# Patient Record
Sex: Female | Born: 1970
Health system: Southern US, Community
[De-identification: ages and names within clinical notes are randomized; demographics above are authoritative.]

## PROBLEM LIST (undated history)

## (undated) DIAGNOSIS — K219 Gastro-esophageal reflux disease without esophagitis: Secondary | ICD-10-CM

## (undated) DIAGNOSIS — G43909 Migraine, unspecified, not intractable, without status migrainosus: Secondary | ICD-10-CM

## (undated) DIAGNOSIS — H6980 Other specified disorders of Eustachian tube, unspecified ear: Secondary | ICD-10-CM

## (undated) DIAGNOSIS — E538 Deficiency of other specified B group vitamins: Secondary | ICD-10-CM

## (undated) DIAGNOSIS — G5600 Carpal tunnel syndrome, unspecified upper limb: Secondary | ICD-10-CM

## (undated) DIAGNOSIS — T7840XA Allergy, unspecified, initial encounter: Secondary | ICD-10-CM

## (undated) DIAGNOSIS — H6990 Unspecified Eustachian tube disorder, unspecified ear: Secondary | ICD-10-CM

## (undated) HISTORY — DX: Deficiency of other specified B group vitamins: E53.8

## (undated) HISTORY — DX: Carpal tunnel syndrome, unspecified upper limb: G56.00

## (undated) HISTORY — DX: Migraine, unspecified, not intractable, without status migrainosus: G43.909

## (undated) HISTORY — DX: Allergy, unspecified, initial encounter: T78.40XA

## (undated) HISTORY — PX: BUNIONECTOMY: SHX129

## (undated) HISTORY — DX: Unspecified eustachian tube disorder, unspecified ear: H69.90

## (undated) HISTORY — DX: Other specified disorders of Eustachian tube, unspecified ear: H69.80

## (undated) HISTORY — PX: TONSILLECTOMY: SUR1361

## (undated) HISTORY — DX: Gastro-esophageal reflux disease without esophagitis: K21.9

---

## 1998-07-08 ENCOUNTER — Other Ambulatory Visit: Admission: RE | Admit: 1998-07-08 | Discharge: 1998-07-08 | Payer: Self-pay | Admitting: Obstetrics and Gynecology

## 1998-11-28 ENCOUNTER — Emergency Department (HOSPITAL_COMMUNITY): Admission: EM | Admit: 1998-11-28 | Discharge: 1998-11-29 | Payer: Self-pay | Admitting: Emergency Medicine

## 1998-12-07 ENCOUNTER — Other Ambulatory Visit: Admission: RE | Admit: 1998-12-07 | Discharge: 1998-12-07 | Payer: Self-pay | Admitting: Obstetrics and Gynecology

## 1999-05-13 ENCOUNTER — Emergency Department (HOSPITAL_COMMUNITY): Admission: EM | Admit: 1999-05-13 | Discharge: 1999-05-13 | Payer: Self-pay | Admitting: Emergency Medicine

## 1999-12-13 ENCOUNTER — Other Ambulatory Visit: Admission: RE | Admit: 1999-12-13 | Discharge: 1999-12-13 | Payer: Self-pay | Admitting: Obstetrics and Gynecology

## 2000-12-14 ENCOUNTER — Other Ambulatory Visit: Admission: RE | Admit: 2000-12-14 | Discharge: 2000-12-14 | Payer: Self-pay | Admitting: Obstetrics and Gynecology

## 2003-01-08 ENCOUNTER — Encounter: Admission: RE | Admit: 2003-01-08 | Discharge: 2003-01-08 | Payer: Self-pay | Admitting: Internal Medicine

## 2004-08-24 ENCOUNTER — Ambulatory Visit: Payer: Self-pay | Admitting: Internal Medicine

## 2005-01-12 ENCOUNTER — Ambulatory Visit: Payer: Self-pay | Admitting: Internal Medicine

## 2006-02-28 ENCOUNTER — Ambulatory Visit: Payer: Self-pay | Admitting: Internal Medicine

## 2006-02-28 LAB — CONVERTED CEMR LAB
ALT: 12 units/L (ref 0–40)
AST: 16 units/L (ref 0–37)
Albumin: 3.1 g/dL — ABNORMAL LOW (ref 3.5–5.2)
Alkaline Phosphatase: 66 units/L (ref 39–117)
BUN: 12 mg/dL (ref 6–23)
Basophils Absolute: 0 10*3/uL (ref 0.0–0.1)
Basophils Relative: 0.1 % (ref 0.0–1.0)
Bilirubin Urine: NEGATIVE
Bilirubin, Direct: 0.2 mg/dL (ref 0.0–0.3)
CO2: 27 meq/L (ref 19–32)
Calcium: 8.9 mg/dL (ref 8.4–10.5)
Chloride: 106 meq/L (ref 96–112)
Cholesterol: 173 mg/dL (ref 0–200)
Creatinine, Ser: 0.8 mg/dL (ref 0.4–1.2)
Eosinophils Absolute: 0.1 10*3/uL (ref 0.0–0.6)
Eosinophils Relative: 1 % (ref 0.0–5.0)
GFR calc Af Amer: 104 mL/min
GFR calc non Af Amer: 86 mL/min
Glucose, Bld: 104 mg/dL — ABNORMAL HIGH (ref 70–99)
HCT: 36 % (ref 36.0–46.0)
HDL: 68.2 mg/dL (ref >39.0)
Hemoglobin, Urine: NEGATIVE
Hemoglobin: 12.8 g/dL (ref 12.0–15.0)
Ketones, ur: NEGATIVE mg/dL
LDL Cholesterol: 90 mg/dL (ref 0–99)
Leukocytes, UA: NEGATIVE
Lymphocytes Relative: 23.6 % (ref 12.0–46.0)
MCHC: 35.5 g/dL (ref 30.0–36.0)
MCV: 90.2 fL (ref 78.0–100.0)
Monocytes Absolute: 0.4 10*3/uL (ref 0.2–0.7)
Monocytes Relative: 6 % (ref 3.0–11.0)
Neutro Abs: 4.5 10*3/uL (ref 1.4–7.7)
Neutrophils Relative %: 69.3 % (ref 43.0–77.0)
Nitrite: NEGATIVE
Platelets: 370 10*3/uL (ref 150–400)
Potassium: 4.2 meq/L (ref 3.5–5.1)
RBC: 3.99 M/uL (ref 3.87–5.11)
RDW: 12.1 % (ref 11.5–14.6)
Sodium: 136 meq/L (ref 135–145)
Specific Gravity, Urine: 1.015 (ref 1.000–1.03)
TSH: 0.58 microintl units/mL (ref 0.35–5.50)
Total Bilirubin: 0.7 mg/dL (ref 0.3–1.2)
Total CHOL/HDL Ratio: 2.5
Total Protein, Urine: NEGATIVE mg/dL
Total Protein: 6.9 g/dL (ref 6.0–8.3)
Triglycerides: 75 mg/dL (ref 0–149)
Urine Glucose: NEGATIVE mg/dL
Urobilinogen, UA: 1 (ref 0.0–1.0)
VLDL: 15 mg/dL (ref 0–40)
WBC: 6.5 10*3/uL (ref 4.5–10.5)
pH: 6.5 (ref 5.0–8.0)

## 2006-03-06 ENCOUNTER — Ambulatory Visit: Payer: Self-pay | Admitting: Internal Medicine

## 2006-03-10 ENCOUNTER — Ambulatory Visit: Payer: Self-pay | Admitting: Internal Medicine

## 2006-12-25 ENCOUNTER — Ambulatory Visit: Payer: Self-pay | Admitting: Internal Medicine

## 2006-12-25 DIAGNOSIS — J029 Acute pharyngitis, unspecified: Secondary | ICD-10-CM | POA: Insufficient documentation

## 2006-12-25 DIAGNOSIS — H6980 Other specified disorders of Eustachian tube, unspecified ear: Secondary | ICD-10-CM | POA: Insufficient documentation

## 2007-01-31 ENCOUNTER — Telehealth: Payer: Self-pay | Admitting: Internal Medicine

## 2007-01-31 ENCOUNTER — Encounter: Payer: Self-pay | Admitting: Internal Medicine

## 2007-01-31 DIAGNOSIS — J309 Allergic rhinitis, unspecified: Secondary | ICD-10-CM | POA: Insufficient documentation

## 2007-02-01 ENCOUNTER — Ambulatory Visit: Payer: Self-pay | Admitting: Internal Medicine

## 2007-02-01 DIAGNOSIS — R002 Palpitations: Secondary | ICD-10-CM | POA: Insufficient documentation

## 2007-05-14 ENCOUNTER — Ambulatory Visit: Payer: Self-pay | Admitting: Internal Medicine

## 2007-05-14 DIAGNOSIS — M654 Radial styloid tenosynovitis [de Quervain]: Secondary | ICD-10-CM | POA: Insufficient documentation

## 2007-05-14 DIAGNOSIS — G5600 Carpal tunnel syndrome, unspecified upper limb: Secondary | ICD-10-CM | POA: Insufficient documentation

## 2007-06-01 ENCOUNTER — Telehealth: Payer: Self-pay | Admitting: Internal Medicine

## 2007-06-05 ENCOUNTER — Ambulatory Visit: Payer: Self-pay | Admitting: Internal Medicine

## 2007-09-10 ENCOUNTER — Ambulatory Visit: Payer: Self-pay | Admitting: Internal Medicine

## 2007-09-10 DIAGNOSIS — J0100 Acute maxillary sinusitis, unspecified: Secondary | ICD-10-CM

## 2007-09-10 DIAGNOSIS — J039 Acute tonsillitis, unspecified: Secondary | ICD-10-CM | POA: Insufficient documentation

## 2007-09-10 DIAGNOSIS — J019 Acute sinusitis, unspecified: Secondary | ICD-10-CM | POA: Insufficient documentation

## 2007-09-13 ENCOUNTER — Telehealth: Payer: Self-pay | Admitting: Internal Medicine

## 2008-03-19 ENCOUNTER — Ambulatory Visit: Payer: Self-pay | Admitting: Endocrinology

## 2008-03-19 DIAGNOSIS — J069 Acute upper respiratory infection, unspecified: Secondary | ICD-10-CM | POA: Insufficient documentation

## 2008-03-20 ENCOUNTER — Telehealth: Payer: Self-pay | Admitting: Internal Medicine

## 2008-03-28 ENCOUNTER — Telehealth: Payer: Self-pay | Admitting: Internal Medicine

## 2008-09-04 ENCOUNTER — Ambulatory Visit: Payer: Self-pay | Admitting: Internal Medicine

## 2008-09-04 DIAGNOSIS — R5381 Other malaise: Secondary | ICD-10-CM | POA: Insufficient documentation

## 2008-09-04 DIAGNOSIS — R5383 Other fatigue: Secondary | ICD-10-CM

## 2008-09-04 LAB — CONVERTED CEMR LAB
BUN: 10 mg/dL (ref 6–23)
Basophils Absolute: 0 10*3/uL (ref 0.0–0.1)
Basophils Relative: 0 % (ref 0.0–3.0)
CO2: 32 meq/L (ref 19–32)
Calcium: 9.4 mg/dL (ref 8.4–10.5)
Chloride: 101 meq/L (ref 96–112)
Creatinine, Ser: 0.8 mg/dL (ref 0.4–1.2)
Eosinophils Absolute: 0.1 10*3/uL (ref 0.0–0.7)
Eosinophils Relative: 0.9 % (ref 0.0–5.0)
Folate: 13.1 ng/mL
Free T4: 0.7 ng/dL (ref 0.6–1.6)
GFR calc non Af Amer: 102.71 mL/min (ref >60)
Glucose, Bld: 102 mg/dL — ABNORMAL HIGH (ref 70–99)
HCT: 39.8 % (ref 36.0–46.0)
Hemoglobin: 13.4 g/dL (ref 12.0–15.0)
Lymphocytes Relative: 23.5 % (ref 12.0–46.0)
Lymphs Abs: 1.6 10*3/uL (ref 0.7–4.0)
MCHC: 33.6 g/dL (ref 30.0–36.0)
MCV: 92.5 fL (ref 78.0–100.0)
Monocytes Absolute: 0.2 10*3/uL (ref 0.1–1.0)
Monocytes Relative: 3.4 % (ref 3.0–12.0)
Neutro Abs: 4.9 10*3/uL (ref 1.4–7.7)
Neutrophils Relative %: 72.2 % (ref 43.0–77.0)
Platelets: 353 10*3/uL (ref 150.0–400.0)
Potassium: 4.5 meq/L (ref 3.5–5.1)
RBC: 4.3 M/uL (ref 3.87–5.11)
RDW: 12.2 % (ref 11.5–14.6)
Sodium: 139 meq/L (ref 135–145)
TSH: 0.94 microintl units/mL (ref 0.35–5.50)
Vitamin B-12: 147 pg/mL — ABNORMAL LOW (ref 211–911)
WBC: 6.8 10*3/uL (ref 4.5–10.5)

## 2008-09-05 ENCOUNTER — Telehealth: Payer: Self-pay | Admitting: Internal Medicine

## 2008-09-08 ENCOUNTER — Ambulatory Visit: Payer: Self-pay | Admitting: Internal Medicine

## 2008-10-09 ENCOUNTER — Ambulatory Visit: Payer: Self-pay | Admitting: Internal Medicine

## 2008-11-07 ENCOUNTER — Ambulatory Visit: Payer: Self-pay | Admitting: Internal Medicine

## 2008-12-08 ENCOUNTER — Ambulatory Visit: Payer: Self-pay | Admitting: Internal Medicine

## 2008-12-08 DIAGNOSIS — E538 Deficiency of other specified B group vitamins: Secondary | ICD-10-CM | POA: Insufficient documentation

## 2009-01-01 ENCOUNTER — Ambulatory Visit: Payer: Self-pay | Admitting: Internal Medicine

## 2009-01-03 ENCOUNTER — Telehealth (INDEPENDENT_AMBULATORY_CARE_PROVIDER_SITE_OTHER): Payer: Self-pay | Admitting: *Deleted

## 2009-01-05 ENCOUNTER — Telehealth: Payer: Self-pay | Admitting: Internal Medicine

## 2009-02-10 ENCOUNTER — Telehealth (INDEPENDENT_AMBULATORY_CARE_PROVIDER_SITE_OTHER): Payer: Self-pay | Admitting: *Deleted

## 2009-02-10 ENCOUNTER — Ambulatory Visit: Payer: Self-pay | Admitting: Internal Medicine

## 2009-02-19 ENCOUNTER — Ambulatory Visit: Payer: Self-pay | Admitting: Internal Medicine

## 2009-02-19 LAB — CONVERTED CEMR LAB: Vitamin B-12: 242 pg/mL (ref 211–911)

## 2009-02-23 ENCOUNTER — Encounter: Payer: Self-pay | Admitting: Internal Medicine

## 2009-06-11 ENCOUNTER — Encounter: Payer: Self-pay | Admitting: Internal Medicine

## 2009-06-14 ENCOUNTER — Encounter: Admission: RE | Admit: 2009-06-14 | Discharge: 2009-06-14 | Payer: Self-pay | Admitting: Orthopedic Surgery

## 2009-09-30 ENCOUNTER — Ambulatory Visit: Payer: Self-pay | Admitting: Internal Medicine

## 2009-09-30 DIAGNOSIS — N390 Urinary tract infection, site not specified: Secondary | ICD-10-CM | POA: Insufficient documentation

## 2009-09-30 LAB — CONVERTED CEMR LAB
Bilirubin Urine: NEGATIVE
Glucose, Urine, Semiquant: NEGATIVE
Ketones, urine, test strip: NEGATIVE
Nitrite: NEGATIVE
Protein, U semiquant: NEGATIVE
Specific Gravity, Urine: 1.02
Urobilinogen, UA: 0.2
WBC Urine, dipstick: NEGATIVE
pH: 5

## 2009-10-07 ENCOUNTER — Telehealth: Payer: Self-pay | Admitting: Internal Medicine

## 2010-02-23 NOTE — Assessment & Plan Note (Signed)
Summary: b12 inj--men---stc  Nurse Visit   Allergies: 1)  ! Tamiflu (Oseltamivir Phosphate)  Medication Administration  Injection # 1:    Medication: Vit B12 1000 mcg    Diagnosis: B12 DEFICIENCY (ICD-266.2)    Route: IM    Site: L deltoid    Exp Date: 11/25/2010    Lot #: [id number]    Mfr: American Regent    Patient tolerated injection without complications    Given by: Lamar Sprinkles, CMA (February 10, 2009 3:14 PM)  Orders Added: 1)  Vit B12 1000 mcg [J3420] 2)  Admin of Therapeutic Inj  intramuscular or subcutaneous [[id number]]   Medication Administration  Injection # 1:    Medication: Vit B12 1000 mcg    Diagnosis: B12 DEFICIENCY (ICD-266.2)    Route: IM    Site: L deltoid    Exp Date: 11/25/2010    Lot #: [id number]    Mfr: American Regent    Patient tolerated injection without complications    Given by: Lamar Sprinkles, CMA (February 10, 2009 3:14 PM)  Orders Added: 1)  Vit B12 1000 mcg [J3420] 2)  Admin of Therapeutic Inj  intramuscular or subcutaneous [[id number]]

## 2010-02-23 NOTE — Letter (Signed)
   [facility] [address] [place], [place]  [address] Phone: [phone]      February 23, 2009   [facility] [address] [place], [place] [address]  RE:  LAB RESULTS  Dear  Ms. Martian,  The following is an interpretation of your most recent lab tests.  Please take note of any instructions provided or changes to medications that have resulted from your lab work.    B12 - 242 ( normal is 211-980)   Just at normal levels. We can hold B12 replacement for sixl months and then recheck.  Call or e-mail me if you have questions ([email or web address]).   Sincerely Yours,    Jacques Navy MD

## 2010-02-23 NOTE — Progress Notes (Signed)
Summary: REQ FOR RX  Phone Note Call from Patient Call back at [phone]   Summary of Call: Patient is requesting rx for vaginal yeast infection. Also, pt continues to c/o ear pain. Please advise.  Initial call taken by: Lamar Sprinkles, CMA,  October 07, 2009 2:38 PM  Follow-up for Phone Call        1. OK for fluconazole stat 150mg  1 pill x 1 2. if ear continues to be a problem - follow-up ov Follow-up by: Jacques Navy MD,  October 08, 2009 12:56 PM  Additional Follow-up for Phone Call Additional follow up Details #1::        pt states diflucan does not work for her. She states her gyno has given her a cream before that works better but unsure of name  Additional Follow-up by: Ami Bullins CMA,  October 08, 2009 2:42 PM    Additional Follow-up for Phone Call Additional follow up Details #2::    most of the antifungal vaginal creams are otc, e.g miconazole, etc. If she can recall the Rx brand from gyn that would be great or perhaps she could get gyn to call it in.  Follow-up by: Jacques Navy MD,  October 08, 2009 3:38 PM  Additional Follow-up for Phone Call Additional follow up Details #3:: Details for Additional Follow-up Action Taken: left mess to call office back...................Marland KitchenLamar Sprinkles, CMA  October 08, 2009 4:49 PM  Advised pt to call gyn to get a prescription. Additional Follow-up by: Daphane Shepherd,  October 09, 2009 3:38 PM  lm for pt to call back/ Ami Bullins CMA  October 09, 2009 2:23 PM

## 2010-02-23 NOTE — Progress Notes (Signed)
Summary: B12  Phone Note Call from Patient   Summary of Call: Pt started monthly b12 injections 08/2008. It has been 6 mths, should she continue? schedule f/u office visit? or labs?  Initial call taken by: Lamar Sprinkles, CMA,  February 10, 2009 3:10 PM  Follow-up for Phone Call        lab: B12 level 266.2 with recommendations to follow Follow-up by: Jacques Navy MD,  February 10, 2009 6:19 PM  Additional Follow-up for Phone Call Additional follow up Details #1::        Left vm for pt to come to lab, please add to idx, [facility] Additional Follow-up by: Lamar Sprinkles, CMA,  February 10, 2009 6:28 PM    Additional Follow-up for Phone Call Additional follow up Details #2::    Lab in [facility]. Follow-up by: Verdell Face,  February 11, 2009 8:15 AM

## 2010-02-23 NOTE — Letter (Signed)
Summary: [facility]  [facility]   Imported By: Lester [place] 06/25/2009 10:40:18  _____________________________________________________________________  External Attachment:    Type:   Image     Comment:   External Document

## 2010-02-23 NOTE — Assessment & Plan Note (Signed)
Summary: ?bladder or ?uti/cd   Vital Signs:  Patient profile:   40 year old female Height:      65 inches Weight:      182 pounds BMI:     30.40 O2 Sat:      97 % on Room air Temp:     97.5 degrees F oral Pulse rate:   84 / minute BP sitting:   110 / 72  (left arm) Cuff size:   regular  Vitals Entered By: Bill Salinas CMA (September 30, 2009 9:52 AM)  O2 Flow:  Room air CC: pt here for poss UTI/ ab   Primary Care Provider:  Norins  CC:  pt here for poss UTI/ ab.  History of Present Illness: Patient presents with a 2 week h/o earache right that has not responded to sudafed. The discomfort does keep her up.  She reports that she has had bladder symptoms for 2-3 weeks: she has pain when standing or walking. She has had frequency, dysuria. No post-micturition bladder spasm. Has had urgency. Has had hot spells and chills. No flank pain.  Current Medications (verified): 1)  Multivitamins   Tabs (Multiple Vitamin) .... Once Daily 2)  Claritin-D 12 Hour 5-120 Mg  Tb12 (Loratadine-Pseudoephedrine) .... Take As Needed 3)  Advil 200 Mg  Tabs (Ibuprofen) .... As Needed 4)  Aleve 220 Mg  Tabs (Naproxen Sodium) .... As Needed 5)  Ortho Tri-Cyclen Lo 0.025 Mg Tabs (Norgestimate-Ethinyl Estradiol) .... Take 1 By Mouth Qd 6)  Tessalon Perles 100 Mg Caps (Benzonatate) .... Three Times A Day As Needed Cough 7)  Terazol 3 80 Mg Supp (Terconazole) .... Insert 1 Supp. Into Vagina At Bedtime For 3 Nights  Allergies (verified): 1)  ! Tamiflu (Oseltamivir Phosphate)  Past History:  Past Medical History: Last updated: 05/14/2007 ALLERGIC RHINITIS (ICD-477.9) DYSFUNCTION OF EUSTACHIAN TUBE (ICD-381.81) SORE THROAT (ICD-462) H/o CTS on L    Past Surgical History: Last updated: 09/10/2007 none  Family History: Last updated: 09/04/2008 father - CAD/MI, HTN, tobacco abuse, DM mother - h/o complicated cholecystecomy  Social History: Last updated: 09/04/2008 college grad singe bought  her own home in '09 Occupation: data entry and cleaning  Risk Factors: Smoking Status: never (01/31/2007)  Review of Systems       The patient complains of abdominal pain and severe indigestion/heartburn.  The patient denies anorexia, fever, weight loss, weight gain, decreased hearing, hoarseness, chest pain, syncope, peripheral edema, hematuria, muscle weakness, difficulty walking, depression, and enlarged lymph nodes.    Physical Exam  General:  WNWD AA femle in no distress Head:  Normocephalic and atraumatic without obvious abnormalities. No apparent alopecia or balding. Eyes:  pupils equal, pupils round, and corneas and lenses clear.   Ears:  EACs clear. TMs slightly rosy with bulg, poor movement with insuffulation. Mouth:  Throat clear Lungs:  normal respiratory effort and normal breath sounds.   Heart:  normal rate and regular rhythm.   Abdomen:  soft, normal bowel sounds, and no guarding.  Tender in the suprapubic region.   Impression & Recommendations:  Problem # 1:  URI (ICD-465.9) Recurrent OM- mild  Plan - TMP/SMX DS two times a day x 5 days           continue sudafed 30mg  three times a day  Her updated medication list for this problem includes:    Claritin-d 12 Hour 5-120 Mg Tb12 (Loratadine-pseudoephedrine) .Marland Kitchen... Take as needed    Advil 200 Mg Tabs (Ibuprofen) .Marland Kitchen... As needed  Aleve 220 Mg Tabs (Naproxen sodium) .Marland Kitchen... As needed    Tessalon Perles 100 Mg Caps (Benzonatate) .Marland Kitchen... Three times a day as needed cough  Problem # 2:  UTI (ICD-599.0) Patient with symptoms of mild dysuria, freqeuncy, suprapubic pain. Dip U/A unreliable - orange colored urine.   Plan - empiric treatment with same antibiotic as above.  The following medications were removed from the medication list:    Azithromycin 250 Mg Tabs (Azithromycin) .Marland Kitchen... 2 tabs day # 1, 1 tab once daily days #2-5 Her updated medication list for this problem includes:    Smz-tmp Ds 800-160 Mg Tabs  (Sulfamethoxazole-trimethoprim) .Marland Kitchen... 1 by mouth two times a day x 5 days for cystitis and uri  Complete Medication List: 1)  Multivitamins Tabs (Multiple vitamin) .... Once daily 2)  Claritin-d 12 Hour 5-120 Mg Tb12 (Loratadine-pseudoephedrine) .... Take as needed 3)  Advil 200 Mg Tabs (Ibuprofen) .... As needed 4)  Aleve 220 Mg Tabs (Naproxen sodium) .... As needed 5)  Ortho Tri-cyclen Lo 0.025 Mg Tabs (Norgestimate-ethinyl estradiol) .... Take 1 by mouth qd 6)  Tessalon Perles 100 Mg Caps (Benzonatate) .... Three times a day as needed cough 7)  Terazol 3 80 Mg Supp (Terconazole) .... Insert 1 supp. into vagina at bedtime for 3 nights 8)  Smz-tmp Ds 800-160 Mg Tabs (Sulfamethoxazole-trimethoprim) .Marland Kitchen.. 1 by mouth two times a day x 5 days for cystitis and uri  Patient Instructions: 1)  Earache - mild bulging of the tympanic membrane and a little rosy in color. Poor movement with insuffulation consistent with eustachian tube dysfunction. Plan - TMP/SMX DS two times a day x 5 days; continue with sudafed (generic) 30mg  three times a day. 2)  Bladder infection - dip urinalysis is negative but you are really tender in the area of the bladder. Plan - TMP/SMX DS two times a day x 5 days; hydrate -cranberry juice is good.  Prescriptions: SMZ-TMP DS 800-160 MG TABS (SULFAMETHOXAZOLE-TRIMETHOPRIM) 1 by mouth two times a day x 5 days for cystitis and URI  #10 x 0   Entered and Authorized by:   Jacques Navy MD   Signed by:   Jacques Navy MD on 09/30/2009   Method used:   Electronically to        [facility] [address] (retail)       [address]       [place], [place]  [id number]       Ph: [phone]       Fax: [phone]   RxID:   [id number]   Laboratory Results   Urine Tests   Date/Time Reported: Ami Bullins CMA  September 30, 2009 9:56 AM   Routine Urinalysis   Color: orange Appearance: Clear Glucose: negative   (Normal Range: Negative) Bilirubin: negative    (Normal Range: Negative) Ketone: negative   (Normal Range: Negative) Spec. Gravity: 1.020   (Normal Range: 1.003-1.035) Blood: trace-lysed   (Normal Range: Negative) pH: 5.0   (Normal Range: 5.0-8.0) Protein: negative   (Normal Range: Negative) Urobilinogen: 0.2   (Normal Range: 0-1) Nitrite: negative   (Normal Range: Negative) Leukocyte Esterace: negative   (Normal Range: Negative)

## 2010-06-11 NOTE — Assessment & Plan Note (Signed)
[facility]                           PRIMARY CARE OFFICE NOTE   Rhonda [name], Rhonda [name]                      MRN:          [id number]  DATE:03/10/2006                            DOB:          04-Feb-1970    Rhonda [name] is a 40 year old woman, who presents for general wellness  examination.  She was last seen in the office for sinus pressure  January 12, 2005.  This is a chronic recurrent problem for the patient.  The patient voiced she has chronic rhinorrhea and sinus pressure, but no  real infection.  She has never been worked up for allergy.   The patient reports that she has had 21 pounds of weight-loss, which has  been planned, through dietary changes, as well as increasing exercise.  She is very pleased with her progress and does plan to shoot for a  weight-loss goal down to 150 pounds, which is her ideal body weight.   PAST MEDICAL HISTORY:  Incomplete data in the chart.   SURGICAL:  The patient has had no known surgeries.   MEDICAL:  1. The patient has had the usual childhood diseases.  2. Chronic pharyngitis and mild conjunctivitis.  3. No other major medical illnesses.   The patient is gravida 0, para 0.   CURRENT MEDICATIONS:  None.   FAMILY HISTORY:  Positive for coronary artery disease, positive for  allergic rhinitis.  Positive for hypertension.  Positive for  cholecystectomy in her mom.   SOCIAL HISTORY:  The patient is living at home.  She is working full-  time.   HEALTH MAINTENANCE:  The patient has just recently seen Dr. Rana Snare,  January of 2008, with normal pelvic exam.  Her Pap smear did come back  with atypical cells and she is scheduled for followup in six months.  She evidently had a rubella titer which was low and her gynecologist has  recommended she have an MMR booster.   HABITS:  Tobacco:  None.  Alcohol:  None.   ALLERGIES:  The patient has no known drug allergies.   REVIEW OF SYSTEMS:  Negative for any  constitutional, cardiovascular,  respiratory, GI, or GU problems.  The patient has had a crown done  recently.   EXAMINATION:  Temperature was 99, blood pressure 105/68, pulse 93,  weight 161, down from 182.  GENERAL APPEARANCE:  This is a well-nourished, well-developed woman,  well-groomed, in no acute distress.  HEENT EXAM:  Normocephalic, atraumatic.  EACs and TMs were normal.  Oropharynx with native dentition, in good repair.  No buccal or palatal  lesions were noted.  Posterior pharynx:  The patient's tonsils were  small.  They appeared normal.  Posterior pharynx is clear.  Conjunctiva  and sclera was clear.  PERLA.  EOMI.  Funduscopic exam was unremarkable.  NECK:  The neck was supple without thyromegaly.  NODES:  No adenopathy was noted in the cervical or supraclavicular  regions.  CHEST:  No CVA tenderness.  LUNGS:  Clear to auscultation and percussion.  CARDIOVASCULAR:  2+ radial pulse, no JVD or carotid bruits.  She had a  quiet precordium with a regular rate and rhythm, without murmurs, rubs  or gallops.  BREAST EXAM:  Deferred to gynecology.  ABDOMEN:  Soft, no guarding, no rebound.  No organosplenomegaly was  noted.  PELVIC AND RECTAL EXAMS:  Deferred to gynecology.  EXTREMITIES:  Without clubbing, cyanosis, edema or deformity.  NEUROLOGIC EXAM:  Nonfocal.   DATA BASE:  Hemoglobin 12.8 g, white count 6500 with a normal  differential.  Chemistries were unremarkable with a serum glucose of  104.  Liver functions were normal.  Cholesterol 173, triglyceride 75,  HDL 68.2, LDL was 90, GFR was 86.  TSH was normal at 0.58.  Urinalysis  was negative.   ASSESSMENT AND PLAN:  1. Allergic rhinitis:  Patient with chronic problems with allergic      rhinitis.  She has had no significant problems with serious      infections.  The patient complains of having to dig stuff out of      her tonsils, but her tonsils appeared small. PLAN:  The patient was      started on Singulair 10  mg daily, seven-day supply provided.  If      she has an adequate response, we will give her a trial of a nasal      steroid spray.  The patient was given the name of Dr. Lucky Cowboy to      contact for consultation as to whether she is a candidate for      tonsillectomy.  2. Weight management:  The patient has done an excellent job of      getting her weight down to her normal range.  Her goal is to lose      10 more pounds to get to normal weight range.  3. Health maintenance:  The patient is current and up to date with      gynecology.  Her labs are unremarkable with normal cholesterol      panel, as noted.  Checking the CDC guidelines for adult      immunization, one or two doses of MMR is recommended at age 39-49      and, if the patient has only had one MMR, she would be due for a      second immunization and this is provided today.   IN SUMMARY:  This is a very pleasant woman, who seems to be healthy.  She will let me know if she responds to Singulair.  She will return to  see me otherwise on a p.r.n. basis.     Rosalyn Gess Norins, MD  Electronically Signed    MEN/MedQ  DD: 03/10/2006  DT: 03/10/2006  Job #: [id number]   cc:   Gala Lewandowsky [place] [address]  Dineen Kid. Rana Snare, M.D.

## 2010-11-23 ENCOUNTER — Encounter: Payer: Self-pay | Admitting: Internal Medicine

## 2010-11-23 ENCOUNTER — Ambulatory Visit (INDEPENDENT_AMBULATORY_CARE_PROVIDER_SITE_OTHER): Payer: Self-pay | Admitting: Internal Medicine

## 2010-11-23 VITALS — BP 104/62 | HR 96 | Temp 97.7°F | Wt 192.0 lb

## 2010-11-23 DIAGNOSIS — J069 Acute upper respiratory infection, unspecified: Secondary | ICD-10-CM

## 2010-11-23 MED ORDER — PROMETHAZINE-CODEINE 6.25-10 MG/5ML PO SYRP: 5.0000 mL | ORAL_SOLUTION | ORAL | Status: AC | PRN

## 2010-11-23 NOTE — Progress Notes (Signed)
  Subjective:    Patient ID: Rhonda [name], female    DOB: 1970-09-05, 40 y.o.   MRN: [id number]  HPI [name] presents for URI symptoms that began on Thursday, 10/25 and continued to have ear pain, cough intially productive, diarrhea, nausea and gas. She felt terrible all weekend. She did take left-over antibiotics, azithromycin 250mg  she had three days worth. She is feeling better but still coughing and her ears still hurt.   I have reviewed the patient's medical history in detail and updated the computerized patient record.    Review of Systems System review is negative for any constitutional, cardiac, pulmonary, GI or neuro symptoms or complaints other than as described in the HPI.       Objective:   Physical Exam Vitals reveiwed- normal Gen'l - heavyset AA woman in no distress HEENT - negative for tenderness frontal or maxillary sinus, TMs normal, throat clear Pulm - normal respirations, no rales/wheezes/rhonchi  Cor- 2+ radial pulse, RRR       Assessment & Plan:  URI - most likely of viral origin with symptoms all but resolved. No indication for antibiotics.  Plan - supportive care.

## 2010-11-23 NOTE — Patient Instructions (Signed)
Generalized infection with GI and Upper respiratory symptoms now with residual ear pressure and cough. On exam there is poor movement of the ear drum suggestion eustachian tuby dysfunction leading to increased pressure type discomfort. Lungs are clear.  Plan - for the ear discomfort take sudafed (generic) 30 mg three times a day while having symptoms           For daytime control of cough use robitussin DM (generic is fine) and for nighttime use phenergan with codeine 1 or 2 tsp at bedtime.           No need for additional antibiotics.  Barotitis Media Barotitis media is soreness (inflammation) of the area behind the eardrum (middle ear). This occurs when the auditory tube (Eustachian tube) leading from the back of the throat to the eardrum is blocked. When it is blocked air cannot move in and out of the middle ear to equalize pressure changes. These pressure changes come from changes in altitude when:  Flying.     Driving in the mountains.     Diving.  Problems are more likely to occur with pressure changes during times when you are congested as from:  Hay fever.     Upper respiratory infection.     A cold.  Damage or hearing loss (barotrauma) caused by this may be permanent. HOME CARE INSTRUCTIONS    Use medicines as recommended by your caregiver. Over the counter medicines will help unblock the canal and can help during times of air travel.     Do not put anything into your ears to clean or unplug them. Eardrops will not be helpful.     Do not swim, dive, or fly until your caregiver says it is all right to do so. If these activities are necessary, chewing gum with frequent swallowing may help. It is also helpful to hold your nose and gently blow to pop your ears for equalizing pressure changes. This forces air into the Eustachian tube.     For little ones with problems, give your baby a bottle of water or juice during periods when pressure changes would be anticipated such as during  take offs and landings associated with air travel.     Only take over-the-counter or prescription medicines for pain, discomfort, or fever as directed by your caregiver.     A decongestant may be helpful in de-congesting the middle ear and make pressure equalization easier. This can be even more effective if the drops (spray) are delivered with the head lying over the edge of a bed with the head tilted toward the ear on the affected side.     If your caregiver has given you a follow-up appointment, it is very important to keep that appointment. Not keeping the appointment could result in a chronic or permanent injury, pain, hearing loss and disability. If there is any problem keeping the appointment, you must call back to this facility for assistance.  SEEK IMMEDIATE MEDICAL CARE IF:    You develop a severe headache, dizziness, severe ear pain, or bloody or pus-like drainage from your ears.     An oral temperature above 102 F (38.9 C) develops.     Your problems do not improve or become worse.  MAKE SURE YOU:    Understand these instructions.     Will watch your condition.     Will get help right away if you are not doing well or get worse.  Document Released: 01/08/2000 Document Revised: 09/22/2010 Document  Reviewed: 08/16/2007 [facility] Patient Information 2012 [place], [place].

## 2010-12-26 ENCOUNTER — Emergency Department (HOSPITAL_COMMUNITY)
Admission: EM | Admit: 2010-12-26 | Discharge: 2010-12-26 | Disposition: A | Discharge status: Home / Self Care | Payer: Self-pay | Source: Home / Self Care

## 2010-12-26 ENCOUNTER — Encounter (HOSPITAL_COMMUNITY): Payer: Self-pay | Admitting: *Deleted

## 2010-12-26 DIAGNOSIS — H7090 Unspecified mastoiditis, unspecified ear: Secondary | ICD-10-CM

## 2010-12-26 MED ORDER — TERCONAZOLE 0.8 % VA CREA: 1.0000 | TOPICAL_CREAM | Freq: Every day | VAGINAL | Status: AC

## 2010-12-26 MED ORDER — AMOXICILLIN 500 MG PO CAPS: 500.0000 mg | ORAL_CAPSULE | Freq: Three times a day (TID) | ORAL | Status: DC

## 2010-12-26 MED ORDER — ANTIPYRINE-BENZOCAINE 5.4-1.4 % OT SOLN
4.0000 [drp] | OTIC | Status: DC | PRN
  Administered 2010-12-26: 4 [drp] via OTIC

## 2010-12-26 NOTE — ED Provider Notes (Signed)
History     CSN: [id number] Arrival date & time: 12/26/2010  1:04 PM   None     Chief Complaint  Patient presents with  . Otalgia    left earache onset x 2 to 3 days progressively worse -     (Consider location/radiation/quality/duration/timing/severity/associated sxs/prior treatment) Patient is a 40 y.o. female presenting with ear pain. The history is provided by the patient.  Otalgia This is a new problem. Episode onset: 3 days ago. There is pain in the right ear. The problem occurs constantly. The problem has been gradually worsening. There has been no fever. The pain is at a severity of 10/10. The pain is severe. Associated symptoms include rhinorrhea. Pertinent negatives include no ear discharge, no headaches, no sore throat, no neck pain, no cough and no rash. Her past medical history does not include chronic ear infection, hearing loss or tympanostomy tube.    Past Medical History  Diagnosis Date  . Allergic rhinitis   . Dysfunction of eustachian tube     Past Surgical History  Procedure Date  . Tonsillectomy     Family History  Problem Relation Age of Onset  . Heart disease Father   . Hypertension Father   . Diabetes Father     History  Substance Use Topics  . Smoking status: Never Smoker   . Smokeless tobacco: Not on file  . Alcohol Use: No    OB History    Grav Para Term Preterm Abortions TAB SAB Ect Mult Living                  Review of Systems  Constitutional: Negative for fever and chills.  HENT: Positive for ear pain and rhinorrhea. Negative for sore throat, neck pain and ear discharge.   Respiratory: Negative for cough.   Skin: Negative for rash.  Neurological: Negative for headaches.    Allergies  Oseltamivir phosphate  Home Medications   Current Outpatient Rx  Name Route Sig Dispense Refill  . IBUPROFEN 200 MG PO TABS Oral Take 200 mg by mouth every 6 (six) hours as needed.      . MULTIPLE VITAMIN PO Oral Take 1 tablet by mouth  daily.      . AMOXICILLIN 500 MG PO CAPS Oral Take 1 capsule (500 mg total) by mouth 3 (three) times daily. 30 capsule 0  . LORATADINE 10 MG PO TABS Oral Take 10 mg by mouth as needed.      . TERCONAZOLE 0.8 % VA CREA Vaginal Place 1 applicator vaginally at bedtime. 20 g 0    Per vagina qhs for 3 days.    BP 98/62  Pulse 78  Temp(Src) 98.1 F (36.7 C) (Oral)  Resp 15  SpO2 98%  LMP 12/01/2010  Physical Exam  Constitutional: She appears well-developed and well-nourished. No distress.       Uncomfortable appearing  HENT:  Right Ear: Tympanic membrane and ear canal normal. There is tenderness. There is mastoid tenderness.  Left Ear: Tympanic membrane, external ear and ear canal normal.  Nose: Mucosal edema and rhinorrhea present.  Mouth/Throat: Oropharynx is clear and moist.       Pain in R ear with otoscope exam; no pain with manipulation of pinna or tragus  Neck: Normal range of motion. Neck supple.  Cardiovascular: Normal rate and regular rhythm.   Pulmonary/Chest: Effort normal and breath sounds normal.  Lymphadenopathy:       Head (right side): No submandibular, no preauricular and no posterior  auricular adenopathy present.       Head (left side): No submandibular adenopathy present.    She has no cervical adenopathy.  Skin: No rash noted.    ED Course  Procedures (including critical care time)  Labs Reviewed - No data to display No results found.   1. Mastoiditis    Pt requests terconazole cream x 3 days to use after antibiotic treatment as she frequently gets yeast infections from antibiotics.  No vaginal sx at this time.    MDM          Cathlyn Parsons, NP 12/26/10 [phone]

## 2010-12-28 ENCOUNTER — Encounter: Payer: Self-pay | Admitting: Internal Medicine

## 2010-12-28 ENCOUNTER — Ambulatory Visit (INDEPENDENT_AMBULATORY_CARE_PROVIDER_SITE_OTHER): Payer: Self-pay | Admitting: Internal Medicine

## 2010-12-28 VITALS — BP 112/60 | HR 80 | Temp 97.7°F

## 2010-12-28 DIAGNOSIS — H9201 Otalgia, right ear: Secondary | ICD-10-CM

## 2010-12-28 DIAGNOSIS — H9209 Otalgia, unspecified ear: Secondary | ICD-10-CM

## 2010-12-28 DIAGNOSIS — T7840XA Allergy, unspecified, initial encounter: Secondary | ICD-10-CM

## 2010-12-28 DIAGNOSIS — H7090 Unspecified mastoiditis, unspecified ear: Secondary | ICD-10-CM

## 2010-12-28 DIAGNOSIS — Z888 Allergy status to other drugs, medicaments and biological substances status: Secondary | ICD-10-CM

## 2010-12-28 MED ORDER — AZITHROMYCIN 250 MG PO TABS: ORAL_TABLET | ORAL | Status: AC

## 2010-12-28 MED ORDER — HYDROCODONE-ACETAMINOPHEN 5-500 MG PO TABS: 1.0000 | ORAL_TABLET | ORAL | Status: AC | PRN

## 2010-12-28 NOTE — Patient Instructions (Signed)
It was good to see you today. Use over-the-counter hydrocortisone or Gold Bond lotion for the itch/rash on your neck and chest Stop amoxicillin as you very done. We have added this antibiotic to your list of allergies Use of Z-Pak for infection and Vicodin as needed for ear pain -also okay to use ibuprofen as you're doing Your prescription(s) have been submitted to your pharmacy. Please take as directed and contact our office if you believe you are having problem(s) with the medication(s).

## 2010-12-28 NOTE — Progress Notes (Signed)
  Subjective:    Patient ID: Rhonda [name], female    DOB: 1970/11/18, 40 y.o.   MRN: [id number]  HPI  Complains of right ear pain Seen for same at [facility] urgent care 48 hours ago> diagnosed mastitis and treated with amoxicillin plus Advil Onset of rash 24 hours ago> itch with redness along neck and anterior chest No lip or tongue swelling, no stridor or wheeze Continued ear discomfort - No headache, fever, sore throat or shortness of breath/cough  Past Medical History  Diagnosis Date  . Allergic rhinitis   . Dysfunction of eustachian tube     Review of Systems  Constitutional: Negative for fatigue.  Respiratory: Negative for wheezing and stridor.   Cardiovascular: Negative for chest pain, palpitations and leg swelling.       Objective:   Physical Exam BP 112/60  Pulse 80  Temp(Src) 97.7 F (36.5 C) (Oral)  SpO2 96%  LMP 12/01/2010 General: No acute distress HEENT: Mild erythema right TM with tenderness to palpation, no swelling of the pinna or canal -oropharynx clear, no dental abnormalities on right upper side. No angioedema Neck: No lymphadenopathy, supple Skin: Mild confluent erythema with small nodular rash anterior neck and chest - Lungs: Clear to auscultation Cardiovascular: regular rate and rhythm  Lab Results  Component Value Date   WBC 6.8 09/04/2008   HGB 13.4 09/04/2008   HCT 39.8 09/04/2008   PLT 353.0 09/04/2008   GLUCOSE 102* 09/04/2008   CHOL 173 02/28/2006   TRIG 75 02/28/2006   HDL 68.2 02/28/2006   LDLCALC 90 02/28/2006   ALT 12 02/28/2006   AST 16 02/28/2006   NA 139 09/04/2008   K 4.5 09/04/2008   CL 101 09/04/2008   CREATININE 0.8 09/04/2008   BUN 10 09/04/2008   CO2 32 09/04/2008   TSH 0.94 09/04/2008        Assessment & Plan:  Allergic reaction to amoxicillin> rash. Stop offending agent as patient is very done. Symptomatic care recommended with antihistamine and over-the-counter steroid cream as needed  Mastitis with right ear pain> antibiotic Z-Pak  prescribed in place of amoxicillin and Vicodin as needed for breakthrough pain not controlled by Advil -noted history of eustachian tube dysfunction and may need followup with primary care physician if unimproved

## 2010-12-31 NOTE — ED Provider Notes (Signed)
Medical screening examination/treatment/procedure(s) were performed by non-physician practitioner and as supervising physician I was immediately available for consultation/collaboration.  Luiz Blare MD   Luiz Blare, MD 12/31/10 [phone]

## 2011-01-05 ENCOUNTER — Telehealth (HOSPITAL_COMMUNITY): Payer: Self-pay | Admitting: *Deleted

## 2011-12-28 ENCOUNTER — Other Ambulatory Visit (HOSPITAL_COMMUNITY): Payer: Self-pay | Admitting: Obstetrics and Gynecology

## 2011-12-28 DIAGNOSIS — Z1231 Encounter for screening mammogram for malignant neoplasm of breast: Secondary | ICD-10-CM

## 2012-01-17 ENCOUNTER — Ambulatory Visit (HOSPITAL_COMMUNITY)
Admission: RE | Admit: 2012-01-17 | Discharge: 2012-01-17 | Disposition: A | Discharge status: Home / Self Care | Payer: Self-pay | Source: Ambulatory Visit | Attending: Obstetrics and Gynecology | Admitting: Obstetrics and Gynecology

## 2012-01-17 DIAGNOSIS — Z1231 Encounter for screening mammogram for malignant neoplasm of breast: Secondary | ICD-10-CM

## 2012-10-01 ENCOUNTER — Encounter: Payer: Self-pay | Admitting: Internal Medicine

## 2012-10-01 ENCOUNTER — Ambulatory Visit (INDEPENDENT_AMBULATORY_CARE_PROVIDER_SITE_OTHER): Payer: 59 | Admitting: Internal Medicine

## 2012-10-01 VITALS — BP 110/80 | HR 91 | Temp 98.9°F | Wt 198.0 lb

## 2012-10-01 DIAGNOSIS — M5430 Sciatica, unspecified side: Secondary | ICD-10-CM

## 2012-10-01 DIAGNOSIS — M5432 Sciatica, left side: Secondary | ICD-10-CM

## 2012-10-01 DIAGNOSIS — R300 Dysuria: Secondary | ICD-10-CM

## 2012-10-01 LAB — POCT URINALYSIS DIPSTICK
Bilirubin, UA: NEGATIVE
Blood, UA: NEGATIVE
Glucose, UA: NEGATIVE
Ketones, UA: NEGATIVE
Nitrite, UA: NEGATIVE
Protein, UA: NEGATIVE
Spec Grav, UA: <=1.005
Urobilinogen, UA: NEGATIVE
pH, UA: 6.5

## 2012-10-01 MED ORDER — SULFAMETHOXAZOLE-TMP DS 800-160 MG PO TABS: 1.0000 | ORAL_TABLET | Freq: Two times a day (BID) | ORAL | Status: DC

## 2012-10-01 NOTE — Progress Notes (Signed)
  Subjective:    Patient ID: Rhonda [name], female    DOB: Sep 24, 1970, 42 y.o.   MRN: [id number]  HPI Rhonda [name] presents for dysuria for several day. Dip U/A positive  She has been having a flair of left sided sciatica - she was given prednisone and muscle relaxants which did not help. She has been seeing Dr. Madelon Lips.   Past Medical History  Diagnosis Date  . Allergic rhinitis   . Dysfunction of eustachian tube    Past Surgical History  Procedure Laterality Date  . Tonsillectomy     Family History  Problem Relation Age of Onset  . Heart disease Father   . Hypertension Father   . Diabetes Father    History   Social History  . Marital Status: Single    Spouse Name: N/A    Number of Children: N/A  . Years of Education: N/A   Occupational History  . Not on file.   Social History Main Topics  . Smoking status: Never Smoker   . Smokeless tobacco: Not on file  . Alcohol Use: No  . Drug Use: No  . Sexual Activity:    Other Topics Concern  . Not on file   Social History Narrative   College grad   Single   Bought her own home in '09   Occupation: Data Entry and cleaning     Current Outpatient Prescriptions on File Prior to Visit  Medication Sig Dispense Refill  . ibuprofen (ADVIL,MOTRIN) 200 MG tablet Take 200 mg by mouth every 6 (six) hours as needed.        . loratadine (CLARITIN) 10 MG tablet Take 10 mg by mouth as needed.        . MULTIPLE VITAMIN PO Take 1 tablet by mouth daily.         No current facility-administered medications on file prior to visit.      Review of Systems System review is negative for any constitutional, cardiac, pulmonary, GI or neuro symptoms or complaints other than as described in the HPI.     Objective:   Physical Exam Filed Vitals:   10/01/12 1645  BP: 110/80  Pulse: 91  Temp: 98.9 F (37.2 C)   Wt Readings from Last 3 Encounters:  10/01/12 198 lb (89.812 kg)  11/23/10 192 lb (87.091 kg)  09/30/09 182 lb (82.555  kg)   Gen'l- overweight woman in no distress Abd - no suprapubic or flank tenderness to exam Back exam: normal stand; normal flex to greater than 100 degrees; normal gait; normal toe/heel walk; normal step up to exam table; normal SLR sitting; normal DTRs at the patellar tendons; normal sensation to light touch, pin-prick and deep vibratory stimulus; no  CVA tenderness; able to move supine to sitting witout assistance.        Assessment & Plan:

## 2012-10-01 NOTE — Patient Instructions (Addendum)
1. UTI - you have symptoms and the urinalysis was positive for white blood cells on dip test. Plan Septra DS twice a day for 5 days.  2. Sciatica - prior MRI in '11 did should some disc disease but no nerve compression. Your back exam today does NOT reveal any signs of an acutely compressed nerve root. Plan Ok to take Aleve twice a day - watch for GI distress  PT is the key: try integrative therapy or [facility] or go to YouTube for instructional videos. This, in the long run, is the best approach to your current pain and to prevent more significant problems.

## 2012-10-02 DIAGNOSIS — M5432 Sciatica, left side: Secondary | ICD-10-CM | POA: Insufficient documentation

## 2012-10-02 NOTE — Assessment & Plan Note (Signed)
Standard back exam w/o radiculopathy.  Plan PT either at [facility]ative therapy or [facility]  Youtube videos for instruction  F/u Dr. Madelon Lips as needed.

## 2012-10-29 ENCOUNTER — Telehealth: Payer: Self-pay | Admitting: *Deleted

## 2012-10-29 MED ORDER — FLUCONAZOLE 150 MG PO TABS: 150.0000 mg | ORAL_TABLET | Freq: Once | ORAL | Status: DC

## 2012-10-29 NOTE — Telephone Encounter (Signed)
Left message for patient that rx has been sent to [facility].

## 2012-10-29 NOTE — Telephone Encounter (Signed)
Ok for dilfucan STAT 150 mg take x 1, #1

## 2012-10-29 NOTE — Telephone Encounter (Signed)
Pt called states after taking Bactrim DS she developed a yeast infection.  Pt is requesting Rx states Monistat was not effective.  Please advise

## 2013-07-05 ENCOUNTER — Encounter: Payer: Self-pay | Admitting: Internal Medicine

## 2013-07-05 ENCOUNTER — Other Ambulatory Visit (INDEPENDENT_AMBULATORY_CARE_PROVIDER_SITE_OTHER): Payer: 59

## 2013-07-05 ENCOUNTER — Ambulatory Visit (INDEPENDENT_AMBULATORY_CARE_PROVIDER_SITE_OTHER): Payer: 59 | Admitting: Internal Medicine

## 2013-07-05 VITALS — BP 110/60 | HR 93 | Temp 97.9°F | Wt 183.6 lb

## 2013-07-05 DIAGNOSIS — R51 Headache: Secondary | ICD-10-CM

## 2013-07-05 DIAGNOSIS — R112 Nausea with vomiting, unspecified: Secondary | ICD-10-CM

## 2013-07-05 DIAGNOSIS — R5381 Other malaise: Secondary | ICD-10-CM

## 2013-07-05 DIAGNOSIS — D510 Vitamin B12 deficiency anemia due to intrinsic factor deficiency: Secondary | ICD-10-CM

## 2013-07-05 DIAGNOSIS — R5383 Other fatigue: Secondary | ICD-10-CM

## 2013-07-05 LAB — CBC WITH DIFFERENTIAL/PLATELET
Basophils Absolute: 0 10*3/uL (ref 0.0–0.1)
Basophils Relative: 0.4 % (ref 0.0–3.0)
Eosinophils Absolute: 0.2 10*3/uL (ref 0.0–0.7)
Eosinophils Relative: 2.2 % (ref 0.0–5.0)
HCT: 40.8 % (ref 36.0–46.0)
Hemoglobin: 13.7 g/dL (ref 12.0–15.0)
Lymphocytes Relative: 25.3 % (ref 12.0–46.0)
Lymphs Abs: 1.8 10*3/uL (ref 0.7–4.0)
MCHC: 33.5 g/dL (ref 30.0–36.0)
MCV: 89.8 fl (ref 78.0–100.0)
Monocytes Absolute: 0.6 10*3/uL (ref 0.1–1.0)
Monocytes Relative: 9 % (ref 3.0–12.0)
Neutro Abs: 4.4 10*3/uL (ref 1.4–7.7)
Neutrophils Relative %: 63.1 % (ref 43.0–77.0)
Platelets: 408 10*3/uL — ABNORMAL HIGH (ref 150.0–400.0)
RBC: 4.55 Mil/uL (ref 3.87–5.11)
RDW: 14.2 % (ref 11.5–15.5)
WBC: 7 10*3/uL (ref 4.0–10.5)

## 2013-07-05 LAB — SEDIMENTATION RATE: Sed Rate: 36 mm/hr — ABNORMAL HIGH (ref 0–22)

## 2013-07-05 LAB — BASIC METABOLIC PANEL
BUN: 5 mg/dL — ABNORMAL LOW (ref 6–23)
CO2: 28 mEq/L (ref 19–32)
Calcium: 8.9 mg/dL (ref 8.4–10.5)
Chloride: 103 mEq/L (ref 96–112)
Creatinine, Ser: 0.8 mg/dL (ref 0.4–1.2)
GFR: 101.77 mL/min (ref >60.00)
Glucose, Bld: 83 mg/dL (ref 70–99)
Potassium: 4.2 mEq/L (ref 3.5–5.1)
Sodium: 140 mEq/L (ref 135–145)

## 2013-07-05 LAB — HEPATIC FUNCTION PANEL
ALT: 22 U/L (ref 0–35)
AST: 25 U/L (ref 0–37)
Albumin: 3.5 g/dL (ref 3.5–5.2)
Alkaline Phosphatase: 96 U/L (ref 39–117)
Bilirubin, Direct: 0.2 mg/dL (ref 0.0–0.3)
Total Bilirubin: 0.6 mg/dL (ref 0.2–1.2)
Total Protein: 7.2 g/dL (ref 6.0–8.3)

## 2013-07-05 LAB — VITAMIN B12: Vitamin B-12: 161 pg/mL — ABNORMAL LOW (ref 211–911)

## 2013-07-05 LAB — TSH: TSH: 0.5 u[IU]/mL (ref 0.35–4.50)

## 2013-07-05 MED ORDER — RANITIDINE HCL 150 MG PO TABS: 150.0000 mg | ORAL_TABLET | Freq: Two times a day (BID) | ORAL | Status: DC

## 2013-07-05 MED ORDER — TRAMADOL HCL 50 MG PO TABS: 50.0000 mg | ORAL_TABLET | Freq: Three times a day (TID) | ORAL | Status: DC | PRN

## 2013-07-05 NOTE — Patient Instructions (Signed)
Your next office appointment will be determined based upon review of your pending labs. Those instructions will be transmitted to you through My Chart.  Chronic daily headaches represent  a conversion of migraines into a headache which recurs repeatedly every day .These are almost always  due to the use of excess nonsteroidals such as ibuprofen or naproxen and or caffeine. The nonsteroidals and caffeine should be weaned as quickly as possible; but they should not be "cold [place]" going from a high dose to none.   Please keep a diary of your headaches . Document  each occurrence on the calendar with notation of : #1 any prodrome ( any non headache symptom such as marked fatigue,visual changes, ,etc ) which precedes actual headache ; #2) severity on 1-10 scale; #3) any triggers ( food/ drink,enviromenntal or weather changes ,physical or emotional stress) in 8-12 hour period prior to the headache; & #4) response to any medications or other intervention. Please review "Headache" @ [facility] for additional information.

## 2013-07-05 NOTE — Progress Notes (Signed)
   Subjective:    Patient ID: Rhonda [name], female    DOB: [birth year], 43 y.o.   MRN: [id number]  HPI Fatigue, headache, dizziness and nausea started on 06/29/13. Pt does not attribute her symptoms to a specific event nor does she report any sick contacts. These symptoms have been constant ever since. On 07/02/13 pt reports emesis x 1. She has had a decreased appetite since this time. There is no change in her skin or nails, nor change in her bowel habits. She does report cold intolerance. She also states she has had B12 deficiency in the past and was treated with IM injections by [facility] in the past. She lost her insurance and has not had labs drawn in a while due to this fact and is wondering if it could be that she needs to go back on the shots.      Review of Systems  Constitutional: Negative for fever and chills.  HENT: Negative for sinus pressure and tinnitus.   Respiratory: Negative for chest tightness and shortness of breath.   Cardiovascular: Negative for chest pain.  Neurological: Negative for numbness.       Objective:   Physical Exam Gen.: Healthy and well-nourished in appearance. Alert, appropriate and cooperative throughout exam. Pt has flat affect.  Eyes: No corneal or conjunctival inflammation noted. Pupils equal round reactive to light and accommodation. Extraocular motion intact.   Mouth: Oral mucosa and oropharynx reveal no lesions or exudates. Teeth in good repair. Neck: No deformities, masses, or tenderness noted. Range of motion. No thyromegaly  Lungs: Normal respiratory effort; chest expands symmetrically. Lungs are clear to auscultation without rales, wheezes, or increased work of breathing.  Heart: Normal rate and rhythm. Normal S1 and S2. No gallop, click, or rub. no murmur. Abdomen: Bowel sounds are hypoactive; abdomen soft and tender upon light palpation. No masses, organomegaly noted. Vascular: radial artery and dorsalis pedis equal and full.  Lymph: No cervical,  axillary lymphadenopathy present.                                                                                 Assessment & Plan:  #1 fatigue; will draw baseline labs today and eval blood work #2 headache; pt can take fioricet PRN #3 nausea; will encourage bland diet. If symptoms persist

## 2013-07-05 NOTE — Progress Notes (Signed)
Pre visit review using our clinic review tool, if applicable. No additional management support is needed unless otherwise documented below in the visit note. 

## 2013-07-05 NOTE — Progress Notes (Signed)
Subjective:    Patient ID: Rhonda [name], female    DOB: Nov 01, 1970, 43 y.o.   MRN: [id number]  HPI  Her symptoms began 06/29/13. There was no specific trigger for eliciting event. She lists her major issue as headaches. Initially she described them as frontal and bitemporal. She corrected this to indicate these were bitemporal. They are persistence up to level V. She had difficulty quantifying the type of pain present. She has no history of migraines.  Initially she started taking Aleve but over the last 3 days has been taking Advil 2 every 4 hours.  She's had nausea and vomited on one occasion 6/9. She also describes significant fatigue and some dizziness. She describes decreased appetite.  She has a history of B12 deficiency. This is not been checked in the recent past.     Review of Systems  She denies any change in hair, skin, nails  No temperature intolerance.  She's had no fever or chills.  She denies any symptoms of rhinosinusitis.  She has no chest tightness or dyspnea.  She denies abdominal pain, melena, or rectal bleeding.       Objective:   Physical Exam  Her neurologic exam including cranial nerves, heel/toe walking, and Romberg testing were all negative. Gen.: Healthy and well-nourished in appearance. Alert, appropriate and cooperative throughout exam. Appears younger than stated age  Head: Normocephalic without obvious abnormalities Eyes: No corneal or conjunctival inflammation noted. Pupils equal round reactive to light and accommodation. Extraocular motion intact. FOV normal. Ears: External  ear exam reveals no significant lesions or deformities. Canals clear .TMs normal. Hearing is grossly normal bilaterally. Nose: External nasal exam reveals no deformity or inflammation. Nasal mucosa are pink and moist. No lesions or exudates noted.   Mouth: Oral mucosa and oropharynx reveal no lesions or exudates. Teeth in good repair. Neck: No deformities, masses,  or tenderness noted. Range of motion & Thyroid normal. Lungs: Normal respiratory effort; chest expands symmetrically. Lungs are clear to auscultation without rales, wheezes, or increased work of breathing. Heart: Normal rate and rhythm. Normal S1 and S2. No gallop, click, or rub.No murmur. Abdomen: Bowel sounds normal; abdomen soft and nontender. No masses, organomegaly or hernias noted.                                  Musculoskeletal/extremities: No deformity or scoliosis noted of  the thoracic or lumbar spine.  No clubbing, cyanosis, edema, or significant extremity  deformity noted. Range of motion normal .Tone & strength normal.  Fingernail health good. Able to lie down & sit up w/o help. Negative SLR bilaterally Vascular: Carotid, radial artery, dorsalis pedis and  posterior tibial pulses are full and equal. No bruits present. Neurologic: Alert and oriented x3. Deep tendon reflexes symmetrical and normal.  Gait normal  including heel & toe walking . Rhomberg & finger to nose       Skin: Intact without suspicious lesions or rashes. Lymph: No cervical, axillary lymphadenopathy present. Psych: Mood and affect are normal. Normally interactive  Assessment & Plan:  #1 headache #2 fatigue #3 N&V #4 B12 deficiency  See orders

## 2013-07-07 ENCOUNTER — Other Ambulatory Visit: Payer: Self-pay | Admitting: Internal Medicine

## 2013-07-07 DIAGNOSIS — E538 Deficiency of other specified B group vitamins: Secondary | ICD-10-CM

## 2013-07-08 ENCOUNTER — Ambulatory Visit (INDEPENDENT_AMBULATORY_CARE_PROVIDER_SITE_OTHER): Payer: 59 | Admitting: *Deleted

## 2013-07-08 DIAGNOSIS — E538 Deficiency of other specified B group vitamins: Secondary | ICD-10-CM

## 2013-07-08 MED ORDER — CYANOCOBALAMIN 1000 MCG/ML IJ SOLN
1000.0000 ug | Freq: Once | INTRAMUSCULAR | Status: AC
  Administered 2013-07-08: 1000 ug via INTRAMUSCULAR

## 2013-07-15 ENCOUNTER — Ambulatory Visit (INDEPENDENT_AMBULATORY_CARE_PROVIDER_SITE_OTHER): Payer: 59 | Admitting: *Deleted

## 2013-07-15 DIAGNOSIS — E538 Deficiency of other specified B group vitamins: Secondary | ICD-10-CM

## 2013-07-15 MED ORDER — CYANOCOBALAMIN 1000 MCG/ML IJ SOLN
1000.0000 ug | Freq: Once | INTRAMUSCULAR | Status: AC
  Administered 2013-07-15: 1000 ug via INTRAMUSCULAR

## 2013-07-22 ENCOUNTER — Ambulatory Visit (INDEPENDENT_AMBULATORY_CARE_PROVIDER_SITE_OTHER): Payer: 59 | Admitting: *Deleted

## 2013-07-22 DIAGNOSIS — E538 Deficiency of other specified B group vitamins: Secondary | ICD-10-CM

## 2013-07-22 MED ORDER — CYANOCOBALAMIN 1000 MCG/ML IJ SOLN
1000.0000 ug | Freq: Once | INTRAMUSCULAR | Status: AC
  Administered 2013-07-22: 1000 ug via INTRAMUSCULAR

## 2013-07-29 ENCOUNTER — Ambulatory Visit (INDEPENDENT_AMBULATORY_CARE_PROVIDER_SITE_OTHER): Payer: 59 | Admitting: *Deleted

## 2013-07-29 DIAGNOSIS — E538 Deficiency of other specified B group vitamins: Secondary | ICD-10-CM

## 2013-07-29 MED ORDER — CYANOCOBALAMIN 1000 MCG/ML IJ SOLN
1000.0000 ug | Freq: Once | INTRAMUSCULAR | Status: AC
  Administered 2013-07-29: 1000 ug via INTRAMUSCULAR

## 2013-08-27 ENCOUNTER — Ambulatory Visit (INDEPENDENT_AMBULATORY_CARE_PROVIDER_SITE_OTHER): Payer: 59 | Admitting: *Deleted

## 2013-08-27 ENCOUNTER — Ambulatory Visit: Payer: 59

## 2013-08-27 DIAGNOSIS — E538 Deficiency of other specified B group vitamins: Secondary | ICD-10-CM

## 2013-08-27 MED ORDER — CYANOCOBALAMIN 1000 MCG/ML IJ SOLN
1000.0000 ug | Freq: Once | INTRAMUSCULAR | Status: AC
  Administered 2013-08-27: 1000 ug via INTRAMUSCULAR

## 2013-09-27 ENCOUNTER — Ambulatory Visit (INDEPENDENT_AMBULATORY_CARE_PROVIDER_SITE_OTHER): Payer: 59

## 2013-09-27 DIAGNOSIS — E538 Deficiency of other specified B group vitamins: Secondary | ICD-10-CM

## 2013-09-27 MED ORDER — CYANOCOBALAMIN 1000 MCG/ML IJ SOLN
1000.0000 ug | Freq: Once | INTRAMUSCULAR | Status: AC
  Administered 2013-09-27: 1000 ug via INTRAMUSCULAR

## 2013-10-28 ENCOUNTER — Ambulatory Visit (INDEPENDENT_AMBULATORY_CARE_PROVIDER_SITE_OTHER): Payer: 59 | Admitting: Internal Medicine

## 2013-10-28 ENCOUNTER — Encounter: Payer: Self-pay | Admitting: Internal Medicine

## 2013-10-28 ENCOUNTER — Ambulatory Visit: Payer: 59

## 2013-10-28 VITALS — BP 116/80 | HR 81 | Temp 98.2°F | Wt 194.5 lb

## 2013-10-28 DIAGNOSIS — B880 Other acariasis: Secondary | ICD-10-CM

## 2013-10-28 DIAGNOSIS — B889 Infestation, unspecified: Secondary | ICD-10-CM

## 2013-10-28 DIAGNOSIS — E538 Deficiency of other specified B group vitamins: Secondary | ICD-10-CM

## 2013-10-28 MED ORDER — HYDROXYZINE HCL 10 MG PO TABS: 10.0000 mg | ORAL_TABLET | Freq: Three times a day (TID) | ORAL | Status: DC | PRN

## 2013-10-28 MED ORDER — CYANOCOBALAMIN 1000 MCG/ML IJ SOLN
1000.0000 ug | Freq: Once | INTRAMUSCULAR | Status: AC
  Administered 2013-10-28: 1000 ug via INTRAMUSCULAR

## 2013-10-28 NOTE — Progress Notes (Signed)
   Subjective:    Patient ID: Rhonda [name], female    DOB: January 02, 1971, 43 y.o.   MRN: [id number]  HPI   She developed lesions over the lower extremities 10/3 which are pruritic in nature. They presented as scattered "red spots".  She had no new exposures to cosmetics or detergents. There's also no environmental exposures of significance.  Her mattress is six years old  Last week she had dizziness and headaches for 2 days which resolved.    Review of Systems   No associated itchy, watery eyes.  Swelling of the lips or tongue denied.  Shortness of breath, wheezing, or cough absent.  Fever ,chills , or sweats denied. Purulence absent.  Diarrhea not present.      Objective:   Physical Exam Positive or pertinent findings include:  She has scattered papules with associated erythema of BLE. These are nontender. They do not blanch. There is no maceration, purulence, or vesicle formation.  General appearance :adequately nourished; in no distress.  Eyes: No conjunctival inflammation or scleral icterus is present.  Oral exam: Dental hygiene is good. Lips and gums are healthy appearing.There is no oropharyngeal erythema or exudate noted.   Heart:  Normal rate and regular rhythm. S1 and S2 normal without gallop, murmur, click, rub or other extra sounds     Lungs:Chest clear to auscultation; no wheezes, rhonchi,rales ,or rubs present.No increased work of breathing.   Abdomen: bowel sounds normal, soft and non-tender without masses, organomegaly or hernias noted.  No guarding or rebound. No flank tenderness to percussion.  Vascular : all pulses equal ; no bruits present.  Skin:Warm & dry.   Lymphatic: No lymphadenopathy is noted about the head, neck, axilla         Assessment & Plan:  #1 mite infestation suggested See orders & AVS

## 2013-10-28 NOTE — Progress Notes (Signed)
Pre visit review using our clinic review tool, if applicable. No additional management support is needed unless otherwise documented below in the visit note. 

## 2013-10-28 NOTE — Patient Instructions (Signed)
   Please paint the papules which are itchy with clear fingernail polish.  Take the medication for itching at home first to make sure it does not cause excessive sleepiness.

## 2013-11-28 ENCOUNTER — Ambulatory Visit (INDEPENDENT_AMBULATORY_CARE_PROVIDER_SITE_OTHER): Payer: 59 | Admitting: *Deleted

## 2013-11-28 DIAGNOSIS — E538 Deficiency of other specified B group vitamins: Secondary | ICD-10-CM

## 2013-11-28 MED ORDER — CYANOCOBALAMIN 1000 MCG/ML IJ SOLN
1000.0000 ug | Freq: Once | INTRAMUSCULAR | Status: AC
  Administered 2013-11-28: 1000 ug via INTRAMUSCULAR

## 2013-12-30 ENCOUNTER — Ambulatory Visit (INDEPENDENT_AMBULATORY_CARE_PROVIDER_SITE_OTHER): Payer: 59 | Admitting: *Deleted

## 2013-12-30 DIAGNOSIS — E538 Deficiency of other specified B group vitamins: Secondary | ICD-10-CM

## 2013-12-30 MED ORDER — CYANOCOBALAMIN 1000 MCG/ML IJ SOLN
1000.0000 ug | Freq: Once | INTRAMUSCULAR | Status: AC
  Administered 2013-12-30: 1000 ug via INTRAMUSCULAR

## 2014-06-11 ENCOUNTER — Ambulatory Visit (INDEPENDENT_AMBULATORY_CARE_PROVIDER_SITE_OTHER): Payer: 59 | Admitting: Internal Medicine

## 2014-06-11 ENCOUNTER — Other Ambulatory Visit: Payer: 59

## 2014-06-11 ENCOUNTER — Telehealth: Payer: Self-pay | Admitting: Internal Medicine

## 2014-06-11 ENCOUNTER — Encounter: Payer: Self-pay | Admitting: Internal Medicine

## 2014-06-11 VITALS — BP 118/86 | HR 92 | Temp 97.7°F | Resp 15 | Wt 204.2 lb

## 2014-06-11 DIAGNOSIS — J0100 Acute maxillary sinusitis, unspecified: Secondary | ICD-10-CM

## 2014-06-11 DIAGNOSIS — E538 Deficiency of other specified B group vitamins: Secondary | ICD-10-CM | POA: Diagnosis not present

## 2014-06-11 MED ORDER — SULFAMETHOXAZOLE-TRIMETHOPRIM 800-160 MG PO TABS
1.0000 | ORAL_TABLET | Freq: Two times a day (BID) | ORAL | Status: DC
Start: 2014-06-11 — End: 2014-07-14

## 2014-06-11 MED ORDER — FLUCONAZOLE 150 MG PO TABS: 150.0000 mg | ORAL_TABLET | Freq: Once | ORAL | Status: DC

## 2014-06-11 NOTE — Patient Instructions (Signed)
Plain Mucinex (NOT D) for thick secretions ;force NON dairy fluids .   Nasal cleansing in the shower as discussed with lather of mild shampoo.After 10 seconds wash off lather while  exhaling through nostrils. Make sure that all residual soap is removed to prevent irritation.  Flonase OR Nasacort AQ 1 spray in each nostril twice a day as needed. Use the "crossover" technique into opposite nostril spraying toward opposite ear @ 45 degree angle, not straight up into nostril.  Plain Allegra (NOT D )  160 daily , Loratidine 10 mg , OR Zyrtec 10 mg @ bedtime  as needed for itchy eyes & sneezing.   Your next office appointment will be determined based upon review of your pending labs  and  xrays  Those instructions will be transmitted to you by My Chart Critical results will be called.   Followup as needed for any active or acute issue. Please report any significant change in your symptoms.

## 2014-06-11 NOTE — Progress Notes (Signed)
   Subjective:    Patient ID: Rhonda [name], female    DOB: 1970/09/24, 44 y.o.   MRN: [id number]  HPI Her  symptoms began 06/09/14 as fullness in the ears and some facial discomfort. She has associated nasal congestion ; obstruction;post nasal drainage & also nasal purulence. She has pain in the maxillary sinus area.  Minor symptoms include fatigue and otic pain. Cough has been productive of green/yellow sputum only in the morning. The acute illness has been associated with some chills.   She was documented to have low B12 level. She did not return after her initial B12 injection in December.  Review of Systems  She denies fever or sweats. She has no frontal headache , dental pain, otic discharge, wheezing, or shortness of breath.    She denies itchy, watery eyes.  Unrelated include some loose stools in the morning. This does not recur during the day      Objective:   Physical Exam General appearance:Adequately nourished; no acute distress or increased work of breathing is present.  Appears lethargic  Lymphatic: No  lymphadenopathy about the head, neck, or axilla .  Eyes: No conjunctival inflammation or lid edema is present. There is no scleral icterus.  Ears:  External ear exam shows no significant lesions or deformities.  Otoscopic examination reveals clear canals, tympanic membranes are intact bilaterally without bulging, retraction, inflammation or discharge.  Nose:  External nasal examination shows no deformity or inflammation. Nasal mucosa are  Moderately erythematous without lesions or exudates No septal dislocation or deviation.No obstruction to airflow.   Oral exam: Dental hygiene is good; lips and gums are healthy appearing.There is no oropharyngeal erythema or exudate . The diameter of the hard palate is decreased.  Neck:  No deformities, thyromegaly, masses, or tenderness noted.   Supple with full range of motion without pain.   Heart:   Heart sounds are somewhat  distant.Normal rate and regular rhythm. S1 and S2 normal without gallop, murmur, click, rub or other extra sounds.   Lungs: breath sounds are decreased generally.Chest clear to auscultation; no wheezes, rhonchi,rales ,or rubs present.  Extremities:  No cyanosis, edema, or clubbing  noted    Skin: Warm & dry w/o tenting   No significant lesions or rash.        Assessment & Plan:  #1 rhinosinusitis with bronchitis vs rhinitis induced cough  Plan: Nasal hygiene interventions discussed. See prescription medications

## 2014-06-11 NOTE — Progress Notes (Signed)
Pre visit review using our clinic review tool, if applicable. No additional management support is needed unless otherwise documented below in the visit note. 

## 2014-06-11 NOTE — Telephone Encounter (Signed)
Dr. Ronnald Ramp, would you be ok with taking this norins xfer patient under your care?

## 2014-06-12 NOTE — Telephone Encounter (Signed)
No new pts for me right now

## 2014-06-14 LAB — METHYLMALONIC ACID, SERUM: Methylmalonic Acid, Quant: 67 nmol/L — ABNORMAL LOW (ref 87–318)

## 2014-06-18 ENCOUNTER — Ambulatory Visit (INDEPENDENT_AMBULATORY_CARE_PROVIDER_SITE_OTHER): Payer: 59 | Admitting: *Deleted

## 2014-06-18 DIAGNOSIS — E538 Deficiency of other specified B group vitamins: Secondary | ICD-10-CM

## 2014-06-18 MED ORDER — CYANOCOBALAMIN 1000 MCG/ML IJ SOLN
1000.0000 ug | Freq: Once | INTRAMUSCULAR | Status: AC
  Administered 2014-06-18: 1000 ug via INTRAMUSCULAR

## 2014-06-19 ENCOUNTER — Other Ambulatory Visit: Payer: Self-pay

## 2014-06-19 ENCOUNTER — Telehealth: Payer: Self-pay | Admitting: Internal Medicine

## 2014-06-19 MED ORDER — FLUCONAZOLE 150 MG PO TABS: 150.0000 mg | ORAL_TABLET | Freq: Once | ORAL | Status: DC

## 2014-06-19 NOTE — Telephone Encounter (Signed)
Pt called in and need med for yeast infection that antibotics cause last week.    [facility] on [address]

## 2014-06-19 NOTE — Telephone Encounter (Signed)
Please advise, thanks.

## 2014-06-19 NOTE — Telephone Encounter (Signed)
Usually Diflucan 150 mg X 1 unless she's been on something else

## 2014-06-19 NOTE — Telephone Encounter (Signed)
rx for diflucan sent to pharm.

## 2014-06-25 ENCOUNTER — Ambulatory Visit (INDEPENDENT_AMBULATORY_CARE_PROVIDER_SITE_OTHER): Payer: 59 | Admitting: Geriatric Medicine

## 2014-06-25 DIAGNOSIS — E538 Deficiency of other specified B group vitamins: Secondary | ICD-10-CM | POA: Diagnosis not present

## 2014-06-25 MED ORDER — CYANOCOBALAMIN 1000 MCG/ML IJ SOLN
1000.0000 ug | Freq: Once | INTRAMUSCULAR | Status: AC
  Administered 2014-06-25: 1000 ug via INTRAMUSCULAR

## 2014-07-02 ENCOUNTER — Ambulatory Visit (INDEPENDENT_AMBULATORY_CARE_PROVIDER_SITE_OTHER): Payer: 59 | Admitting: Certified Registered Nurse Anesthetist

## 2014-07-02 DIAGNOSIS — E538 Deficiency of other specified B group vitamins: Secondary | ICD-10-CM

## 2014-07-02 MED ORDER — CYANOCOBALAMIN 1000 MCG/ML IJ SOLN
1000.0000 ug | Freq: Once | INTRAMUSCULAR | Status: AC
  Administered 2014-07-02: 1000 ug via INTRAMUSCULAR

## 2014-07-10 ENCOUNTER — Ambulatory Visit (INDEPENDENT_AMBULATORY_CARE_PROVIDER_SITE_OTHER): Payer: 59 | Admitting: *Deleted

## 2014-07-10 DIAGNOSIS — E538 Deficiency of other specified B group vitamins: Secondary | ICD-10-CM | POA: Diagnosis not present

## 2014-07-10 MED ORDER — CYANOCOBALAMIN 1000 MCG/ML IJ SOLN
1000.0000 ug | Freq: Once | INTRAMUSCULAR | Status: AC
  Administered 2014-07-10: 1000 ug via INTRAMUSCULAR

## 2014-07-14 ENCOUNTER — Other Ambulatory Visit (INDEPENDENT_AMBULATORY_CARE_PROVIDER_SITE_OTHER): Payer: 59

## 2014-07-14 ENCOUNTER — Encounter: Payer: Self-pay | Admitting: Family

## 2014-07-14 ENCOUNTER — Ambulatory Visit (INDEPENDENT_AMBULATORY_CARE_PROVIDER_SITE_OTHER): Payer: 59 | Admitting: Family

## 2014-07-14 VITALS — BP 120/72 | HR 84 | Temp 98.1°F | Resp 18 | Ht 65.0 in | Wt 196.4 lb

## 2014-07-14 DIAGNOSIS — Z23 Encounter for immunization: Secondary | ICD-10-CM | POA: Diagnosis not present

## 2014-07-14 DIAGNOSIS — Z0000 Encounter for general adult medical examination without abnormal findings: Secondary | ICD-10-CM | POA: Insufficient documentation

## 2014-07-14 LAB — BASIC METABOLIC PANEL
BUN: 8 mg/dL (ref 6–23)
CHLORIDE: 101 meq/L (ref 96–112)
CO2: 28 meq/L (ref 19–32)
Calcium: 9.8 mg/dL (ref 8.4–10.5)
Creatinine, Ser: 0.8 mg/dL (ref 0.40–1.20)
GFR: 99.83 mL/min (ref >60.00)
Glucose, Bld: 103 mg/dL — ABNORMAL HIGH (ref 70–99)
Potassium: 3.9 mEq/L (ref 3.5–5.1)
Sodium: 136 mEq/L (ref 135–145)

## 2014-07-14 LAB — CBC
HCT: 41.9 % (ref 36.0–46.0)
HEMOGLOBIN: 14.1 g/dL (ref 12.0–15.0)
MCHC: 33.8 g/dL (ref 30.0–36.0)
MCV: 89.8 fl (ref 78.0–100.0)
PLATELETS: 406 10*3/uL — AB (ref 150.0–400.0)
RBC: 4.66 Mil/uL (ref 3.87–5.11)
RDW: 14.2 % (ref 11.5–15.5)
WBC: 6.7 10*3/uL (ref 4.0–10.5)

## 2014-07-14 LAB — LIPID PANEL
CHOLESTEROL: 176 mg/dL (ref 0–200)
HDL: 56.9 mg/dL (ref >39.00)
LDL Cholesterol: 105 mg/dL — ABNORMAL HIGH (ref 0–99)
NONHDL: 119.1
Total CHOL/HDL Ratio: 3
Triglycerides: 73 mg/dL (ref 0.0–149.0)
VLDL: 14.6 mg/dL (ref 0.0–40.0)

## 2014-07-14 LAB — RPR

## 2014-07-14 LAB — TSH: TSH: 0.63 u[IU]/mL (ref 0.35–4.50)

## 2014-07-14 NOTE — Assessment & Plan Note (Signed)
1) Anticipatory Guidance: Discussed importance of wearing a seatbelt while driving and not texting while driving; changing batteries in smoke detector at least once annually; wearing suntan lotion when outside; eating a balanced and moderate diet; getting physical activity at least 30 minutes per day.  2) Immunizations / Screenings / Labs:  Tetanus shot updated today. All other immunizations are up-to-date per recommendations. Due for an ophthalmology exam which will be scheduled independently. All other screenings are up-to-date per recommendations. Obtain CBC, BMET, Lipid profile, HIV, HSV 1/2, RPR, and TSH.   Overall well exam. Minimal risk factors for cardiovascular disease with the exception of her BMI. Currently indicated as obese with the recommendations of losing 5-10% of current body weight. Increase nutrient density and decreasing saturated fats and increasing physical activity to 30 minutes most days of the week. Continue other healthy lifestyle behaviors. Requesting STD testing as above. Follow up prevention exam in 1 year. Follow-up office visit pending lab work.

## 2014-07-14 NOTE — Progress Notes (Signed)
Subjective:    Patient ID: Rhonda [name], female    DOB: 05-11-1970, 44 y.o.   MRN: [id number]  Chief Complaint  Patient presents with  . Establish Care    CPE, Fasting    HPI:  Rhonda [name] is a 44 y.o. female who presents today for an annual wellness visit.   1) Health Maintenance -   Diet - Averages about 2 meals per day consisting of generally fast food, hamburgers and hot dogs, some dairy. Denies caffeine intake   Exercise - Elliptical machine about 3x per week for about 10 min.    2) Preventative Exams / Immunizations:  Dental -- Up to date  Vision -- Due for exam   Health Maintenance  Topic Date Due  . HIV Screening  09/15/1984  . TETANUS/TDAP  09/15/1988  . INFLUENZA VACCINE  08/25/2014  . PAP SMEAR  12/24/2016  Tetanus today  Immunization History  Administered Date(s) Administered  . MMR 03/10/2006    Allergies  Allergen Reactions  . Oseltamivir Phosphate     REACTION: swelling  . Amoxicillin Rash  . Doxycycline Rash     Outpatient Prescriptions Prior to Visit  Medication Sig Dispense Refill  . fluconazole (DIFLUCAN) 150 MG tablet Take 1 tablet (150 mg total) by mouth once. Repeat if needed in 3 days 2 tablet 1  . ibuprofen (ADVIL,MOTRIN) 200 MG tablet Take 200 mg by mouth every 6 (six) hours as needed.      . sulfamethoxazole-trimethoprim (BACTRIM DS,SEPTRA DS) 800-160 MG per tablet Take 1 tablet by mouth 2 (two) times daily. 20 tablet 0   No facility-administered medications prior to visit.     Past Medical History  Diagnosis Date  . Allergic rhinitis   . Dysfunction of eustachian tube   . Allergy   . B12 deficiency   . Carpal tunnel syndrome      Past Surgical History  Procedure Laterality Date  . Tonsillectomy       Family History  Problem Relation Age of Onset  . Heart disease Father   . Hypertension Father   . Diabetes Father   . Heart disease Maternal Grandmother      History   Social History  . Marital  Status: Single    Spouse Name: N/A  . Number of Children: 0  . Years of Education: 14   Occupational History  . Office Support    Social History Main Topics  . Smoking status: Never Smoker   . Smokeless tobacco: Never Used  . Alcohol Use: No  . Drug Use: No  . Sexual Activity: Not on file   Other Topics Concern  . Not on file   Social History Narrative   College grad   Single   Bought her own home in '09   Occupation: Programme researcher, broadcasting/film/video and cleaning   Fun: Resting, yard work   Denies religious beliefs effecting health care.     Review of Systems  Constitutional: Denies fever, chills, fatigue, or significant weight gain/loss. HENT: Head: Denies headache or neck pain Ears: Denies changes in hearing, ringing in ears, earache, drainage Nose: Denies discharge, stuffiness, itching, nosebleed, sinus pain Throat: Denies sore throat, hoarseness, dry mouth, sores, thrush Eyes: Denies loss/changes in vision, pain, redness, blurry/double vision, flashing lights Cardiovascular: Denies chest pain/discomfort, tightness, palpitations, shortness of breath with activity, difficulty lying down, swelling, sudden awakening with shortness of breath Respiratory: Denies shortness of breath, cough, sputum production, wheezing Gastrointestinal: Denies dysphasia, heartburn, change in appetite,  nausea, change in bowel habits, rectal bleeding, constipation, diarrhea, yellow skin or eyes Genitourinary: Denies frequency, urgency, burning/pain, blood in urine, incontinence, change in urinary strength. Musculoskeletal: Denies muscle/joint pain, stiffness, back pain, redness or swelling of joints, trauma Skin: Denies rashes, lumps, itching, dryness, color changes, or hair/nail changes Neurological: Denies dizziness, fainting, seizures, weakness, numbness, tingling, tremor Psychiatric - Denies nervousness, stress, depression or memory loss Endocrine: Denies heat or cold intolerance, sweating, frequent urination,  excessive thirst, changes in appetite Hematologic: Denies ease of bruising or bleeding     Objective:    BP 120/72 mmHg  Pulse 84  Temp(Src) 98.1 F (36.7 C) (Oral)  Resp 18  Ht 5' 5" (1.651 m)  Wt 196 lb 6.4 oz (89.086 kg)  BMI 32.68 kg/m2  SpO2 96% Nursing note and vital signs reviewed.  Physical Exam  Constitutional: She is oriented to person, place, and time. She appears well-developed and well-nourished.  HENT:  Head: Normocephalic.  Right Ear: Hearing, tympanic membrane, external ear and ear canal normal.  Left Ear: Hearing, tympanic membrane, external ear and ear canal normal.  Nose: Nose normal.  Mouth/Throat: Uvula is midline, oropharynx is clear and moist and mucous membranes are normal.  Eyes: Conjunctivae and EOM are normal. Pupils are equal, round, and reactive to light.  Neck: Neck supple. No JVD present. No tracheal deviation present. No thyromegaly present.  Cardiovascular: Normal rate, regular rhythm, normal heart sounds and intact distal pulses.   Pulmonary/Chest: Effort normal and breath sounds normal.  Abdominal: Soft. Bowel sounds are normal. She exhibits no distension and no mass. There is no tenderness. There is no rebound and no guarding.  Musculoskeletal: Normal range of motion. She exhibits no edema or tenderness.  Lymphadenopathy:    She has no cervical adenopathy.  Neurological: She is alert and oriented to person, place, and time. She has normal reflexes. No cranial nerve deficit. She exhibits normal muscle tone. Coordination normal.  Skin: Skin is warm and dry.  Psychiatric: She has a normal mood and affect. Her behavior is normal. Judgment and thought content normal.       Assessment & Plan:   Problem List Items Addressed This Visit      Other   Routine general medical examination at a health care facility - Primary    1) Anticipatory Guidance: Discussed importance of wearing a seatbelt while driving and not texting while driving; changing  batteries in smoke detector at least once annually; wearing suntan lotion when outside; eating a balanced and moderate diet; getting physical activity at least 30 minutes per day.  2) Immunizations / Screenings / Labs:  Tetanus shot updated today. All other immunizations are up-to-date per recommendations. Due for an ophthalmology exam which will be scheduled independently. All other screenings are up-to-date per recommendations. Obtain CBC, BMET, Lipid profile, HIV, HSV 1/2, RPR, and TSH.   Overall well exam. Minimal risk factors for cardiovascular disease with the exception of her BMI. Currently indicated as obese with the recommendations of losing 5-10% of current body weight. Increase nutrient density and decreasing saturated fats and increasing physical activity to 30 minutes most days of the week. Continue other healthy lifestyle behaviors. Requesting STD testing as above. Follow up prevention exam in 1 year. Follow-up office visit pending lab work.       Relevant Orders   Basic metabolic panel   CBC   Lipid panel   TSH   HIV antibody   HSV 2 antibody, IgG   HSV 1 antibody,  IgG   RPR

## 2014-07-14 NOTE — Patient Instructions (Addendum)
Thank you for choosing [facility].  Summary/Instructions:  Please stop by the lab on the basement level of the building for your blood work. Your results will be released to [facility] (or called to you) after review, usually within 72 hours after test completion. If any changes need to be made, you will be notified at that same time.  If your symptoms worsen or fail to improve, please contact our office for further instruction, or in case of emergency go directly to the emergency room at the closest medical facility.    Health Maintenance Adopting a healthy lifestyle and getting preventive care can go a long way to promote health and wellness. Talk with your health care provider about what schedule of regular examinations is right for you. This is a good chance for you to check in with your provider about disease prevention and staying healthy. In between checkups, there are plenty of things you can do on your own. Experts have done a lot of research about which lifestyle changes and preventive measures are most likely to keep you healthy. Ask your health care provider for more information. WEIGHT AND DIET  Eat a healthy diet  Be sure to include plenty of vegetables, fruits, low-fat dairy products, and lean protein.  Do not eat a lot of foods high in solid fats, added sugars, or salt.  Get regular exercise. This is one of the most important things you can do for your health.  Most adults should exercise for at least 150 minutes each week. The exercise should increase your heart rate and make you sweat (moderate-intensity exercise).  Most adults should also do strengthening exercises at least twice a week. This is in addition to the moderate-intensity exercise.  Maintain a healthy weight  Body mass index (BMI) is a measurement that can be used to identify possible weight problems. It estimates body fat based on height and weight. Your health care provider can help determine your BMI  and help you achieve or maintain a healthy weight.  For females 27 years of age and older:   A BMI below 18.5 is considered underweight.  A BMI of 18.5 to 24.9 is normal.  A BMI of 25 to 29.9 is considered overweight.  A BMI of 30 and above is considered obese.  Watch levels of cholesterol and blood lipids  You should start having your blood tested for lipids and cholesterol at 45 years of age, then have this test every 5 years.  You may need to have your cholesterol levels checked more often if:  Your lipid or cholesterol levels are high.  You are older than 45 years of age.  You are at high risk for heart disease.  CANCER SCREENING   Lung Cancer  Lung cancer screening is recommended for adults 64-54 years old who are at high risk for lung cancer because of a history of smoking.  A yearly low-dose CT scan of the lungs is recommended for people who:  Currently smoke.  Have quit within the past 15 years.  Have at least a 30-pack-year history of smoking. A pack year is smoking an average of one pack of cigarettes a day for 1 year.  Yearly screening should continue until it has been 15 years since you quit.  Yearly screening should stop if you develop a health problem that would prevent you from having lung cancer treatment.  Breast Cancer  Practice breast self-awareness. This means understanding how your breasts normally appear and feel.  It  also means doing regular breast self-exams. Let your health care provider know about any changes, no matter how small.  If you are in your 20s or 30s, you should have a clinical breast exam (CBE) by a health care provider every 1-3 years as part of a regular health exam.  If you are 40 or older, have a CBE every year. Also consider having a breast X-ray (mammogram) every year.  If you have a family history of breast cancer, talk to your health care provider about genetic screening.  If you are at high risk for breast cancer,  talk to your health care provider about having an MRI and a mammogram every year.  Breast cancer gene (BRCA) assessment is recommended for women who have family members with BRCA-related cancers. BRCA-related cancers include:  Breast.  Ovarian.  Tubal.  Peritoneal cancers.  Results of the assessment will determine the need for genetic counseling and BRCA1 and BRCA2 testing. Cervical Cancer Routine pelvic examinations to screen for cervical cancer are no longer recommended for nonpregnant women who are considered low risk for cancer of the pelvic organs (ovaries, uterus, and vagina) and who do not have symptoms. A pelvic examination may be necessary if you have symptoms including those associated with pelvic infections. Ask your health care provider if a screening pelvic exam is right for you.   The Pap test is the screening test for cervical cancer for women who are considered at risk.  If you had a hysterectomy for a problem that was not cancer or a condition that could lead to cancer, then you no longer need Pap tests.  If you are older than 65 years, and you have had normal Pap tests for the past 10 years, you no longer need to have Pap tests.  If you have had past treatment for cervical cancer or a condition that could lead to cancer, you need Pap tests and screening for cancer for at least 20 years after your treatment.  If you no longer get a Pap test, assess your risk factors if they change (such as having a new sexual partner). This can affect whether you should start being screened again.  Some women have medical problems that increase their chance of getting cervical cancer. If this is the case for you, your health care provider may recommend more frequent screening and Pap tests.  The human papillomavirus (HPV) test is another test that may be used for cervical cancer screening. The HPV test looks for the virus that can cause cell changes in the cervix. The cells collected  during the Pap test can be tested for HPV.  The HPV test can be used to screen women 30 years of age and older. Getting tested for HPV can extend the interval between normal Pap tests from three to five years.  An HPV test also should be used to screen women of any age who have unclear Pap test results.  After 45 years of age, women should have HPV testing as often as Pap tests.  Colorectal Cancer  This type of cancer can be detected and often prevented.  Routine colorectal cancer screening usually begins at 45 years of age and continues through 45 years of age.  Your health care provider may recommend screening at an earlier age if you have risk factors for colon cancer.  Your health care provider may also recommend using home test kits to check for hidden blood in the stool.  A small camera at the end   of a tube can be used to examine your colon directly (sigmoidoscopy or colonoscopy). This is done to check for the earliest forms of colorectal cancer.  Routine screening usually begins at age 50.  Direct examination of the colon should be repeated every 5-10 years through 45 years of age. However, you may need to be screened more often if early forms of precancerous polyps or small growths are found. Skin Cancer  Check your skin from head to toe regularly.  Tell your health care provider about any new moles or changes in moles, especially if there is a change in a mole's shape or color.  Also tell your health care provider if you have a mole that is larger than the size of a pencil eraser.  Always use sunscreen. Apply sunscreen liberally and repeatedly throughout the day.  Protect yourself by wearing long sleeves, pants, a wide-brimmed hat, and sunglasses whenever you are outside. HEART DISEASE, DIABETES, AND HIGH BLOOD PRESSURE   Have your blood pressure checked at least every 1-2 years. High blood pressure causes heart disease and increases the risk of stroke.  If you are  between 55 years and 79 years old, ask your health care provider if you should take aspirin to prevent strokes.  Have regular diabetes screenings. This involves taking a blood sample to check your fasting blood sugar level.  If you are at a normal weight and have a low risk for diabetes, have this test once every three years after 45 years of age.  If you are overweight and have a high risk for diabetes, consider being tested at a younger age or more often. PREVENTING INFECTION  Hepatitis B  If you have a higher risk for hepatitis B, you should be screened for this virus. You are considered at high risk for hepatitis B if:  You were born in a country where hepatitis B is common. Ask your health care provider which countries are considered high risk.  Your parents were born in a high-risk country, and you have not been immunized against hepatitis B (hepatitis B vaccine).  You have HIV or AIDS.  You use needles to inject street drugs.  You live with someone who has hepatitis B.  You have had sex with someone who has hepatitis B.  You get hemodialysis treatment.  You take certain medicines for conditions, including cancer, organ transplantation, and autoimmune conditions. Hepatitis C  Blood testing is recommended for:  Everyone born from 1945 through 1965.  Anyone with known risk factors for hepatitis C. Sexually transmitted infections (STIs)  You should be screened for sexually transmitted infections (STIs) including gonorrhea and chlamydia if:  You are sexually active and are younger than 45 years of age.  You are older than 45 years of age and your health care provider tells you that you are at risk for this type of infection.  Your sexual activity has changed since you were last screened and you are at an increased risk for chlamydia or gonorrhea. Ask your health care provider if you are at risk.  If you do not have HIV, but are at risk, it may be recommended that you  take a prescription medicine daily to prevent HIV infection. This is called pre-exposure prophylaxis (PrEP). You are considered at risk if:  You are sexually active and do not regularly use condoms or know the HIV status of your partner(s).  You take drugs by injection.  You are sexually active with a partner who has HIV.   Talk with your health care provider about whether you are at high risk of being infected with HIV. If you choose to begin PrEP, you should first be tested for HIV. You should then be tested every 3 months for as long as you are taking PrEP.  PREGNANCY   If you are premenopausal and you may become pregnant, ask your health care provider about preconception counseling.  If you may become pregnant, take 400 to 800 micrograms (mcg) of folic acid every day.  If you want to prevent pregnancy, talk to your health care provider about birth control (contraception). OSTEOPOROSIS AND MENOPAUSE   Osteoporosis is a disease in which the bones lose minerals and strength with aging. This can result in serious bone fractures. Your risk for osteoporosis can be identified using a bone density scan.  If you are 65 years of age or older, or if you are at risk for osteoporosis and fractures, ask your health care provider if you should be screened.  Ask your health care provider whether you should take a calcium or vitamin D supplement to lower your risk for osteoporosis.  Menopause may have certain physical symptoms and risks.  Hormone replacement therapy may reduce some of these symptoms and risks. Talk to your health care provider about whether hormone replacement therapy is right for you.  HOME CARE INSTRUCTIONS   Schedule regular health, dental, and eye exams.  Stay current with your immunizations.   Do not use any tobacco products including cigarettes, chewing tobacco, or electronic cigarettes.  If you are pregnant, do not drink alcohol.  If you are breastfeeding, limit how  much and how often you drink alcohol.  Limit alcohol intake to no more than 1 drink per day for nonpregnant women. One drink equals 12 ounces of beer, 5 ounces of wine, or 1 ounces of hard liquor.  Do not use street drugs.  Do not share needles.  Ask your health care provider for help if you need support or information about quitting drugs.  Tell your health care provider if you often feel depressed.  Tell your health care provider if you have ever been abused or do not feel safe at home. Document Released: 07/26/2010 Document Revised: 05/27/2013 Document Reviewed: 12/12/2012 Exit[employer] Patient Information 2015 [employer]. This information is not intended to replace advice given to you by your health care provider. Make sure you discuss any questions you have with your health care provider.  

## 2014-07-14 NOTE — Progress Notes (Signed)
Pre visit review using our clinic review tool, if applicable. No additional management support is needed unless otherwise documented below in the visit note. 

## 2014-07-15 ENCOUNTER — Encounter: Payer: Self-pay | Admitting: Family

## 2014-07-15 LAB — HSV 1 ANTIBODY, IGG: HSV 1 Glycoprotein G Ab, IgG: 4.62 IV — ABNORMAL HIGH

## 2014-07-15 LAB — HIV ANTIBODY (ROUTINE TESTING W REFLEX): HIV 1&2 Ab, 4th Generation: NONREACTIVE

## 2014-07-15 LAB — HSV 2 ANTIBODY, IGG: HSV 2 Glycoprotein G Ab, IgG: <0.1 IV

## 2014-08-04 ENCOUNTER — Ambulatory Visit (INDEPENDENT_AMBULATORY_CARE_PROVIDER_SITE_OTHER): Payer: 59

## 2014-08-04 DIAGNOSIS — E538 Deficiency of other specified B group vitamins: Secondary | ICD-10-CM | POA: Diagnosis not present

## 2014-08-04 MED ORDER — CYANOCOBALAMIN 1000 MCG/ML IJ SOLN
1000.0000 ug | Freq: Once | INTRAMUSCULAR | Status: AC
  Administered 2014-08-04: 1000 ug via INTRAMUSCULAR

## 2014-08-25 ENCOUNTER — Ambulatory Visit (INDEPENDENT_AMBULATORY_CARE_PROVIDER_SITE_OTHER): Payer: 59

## 2014-08-25 DIAGNOSIS — E538 Deficiency of other specified B group vitamins: Secondary | ICD-10-CM

## 2014-08-25 MED ORDER — CYANOCOBALAMIN 1000 MCG/ML IJ SOLN
1000.0000 ug | Freq: Once | INTRAMUSCULAR | Status: AC
  Administered 2014-08-25: 1000 ug via SUBCUTANEOUS

## 2014-09-09 ENCOUNTER — Telehealth: Payer: Self-pay | Admitting: Family

## 2014-09-09 NOTE — Telephone Encounter (Signed)
Pt called in and would like a call back in ref to her labs.  Can you call her at [phone]

## 2014-09-11 NOTE — Telephone Encounter (Signed)
LVM for pt to call back.

## 2014-09-12 NOTE — Telephone Encounter (Signed)
Tried calling pt back. LVM letting her know that her last lab results have been sent through my chart.

## 2014-09-30 ENCOUNTER — Ambulatory Visit: Payer: 59

## 2014-10-06 ENCOUNTER — Ambulatory Visit (INDEPENDENT_AMBULATORY_CARE_PROVIDER_SITE_OTHER): Payer: 59

## 2014-10-06 DIAGNOSIS — D649 Anemia, unspecified: Secondary | ICD-10-CM

## 2014-10-06 MED ORDER — CYANOCOBALAMIN 1000 MCG/ML IJ SOLN
1000.0000 ug | Freq: Once | INTRAMUSCULAR | Status: AC
  Administered 2014-10-06: 1000 ug via INTRAMUSCULAR

## 2014-10-27 ENCOUNTER — Ambulatory Visit (INDEPENDENT_AMBULATORY_CARE_PROVIDER_SITE_OTHER): Payer: 59

## 2014-10-27 DIAGNOSIS — E538 Deficiency of other specified B group vitamins: Secondary | ICD-10-CM | POA: Diagnosis not present

## 2014-10-27 MED ORDER — CYANOCOBALAMIN 1000 MCG/ML IJ SOLN
1000.0000 ug | Freq: Once | INTRAMUSCULAR | Status: AC
  Administered 2014-10-27: 1000 ug via INTRAMUSCULAR

## 2014-12-01 ENCOUNTER — Ambulatory Visit (INDEPENDENT_AMBULATORY_CARE_PROVIDER_SITE_OTHER): Payer: 59

## 2014-12-01 DIAGNOSIS — E538 Deficiency of other specified B group vitamins: Secondary | ICD-10-CM | POA: Diagnosis not present

## 2014-12-01 MED ORDER — CYANOCOBALAMIN 1000 MCG/ML IJ SOLN
1000.0000 ug | Freq: Once | INTRAMUSCULAR | Status: AC
  Administered 2014-12-01: 1000 ug via INTRAMUSCULAR

## 2014-12-29 ENCOUNTER — Ambulatory Visit (INDEPENDENT_AMBULATORY_CARE_PROVIDER_SITE_OTHER): Payer: 59

## 2014-12-29 DIAGNOSIS — E538 Deficiency of other specified B group vitamins: Secondary | ICD-10-CM

## 2014-12-29 MED ORDER — CYANOCOBALAMIN 1000 MCG/ML IJ SOLN
1000.0000 ug | Freq: Once | INTRAMUSCULAR | Status: AC
  Administered 2014-12-29: 1000 ug via INTRAMUSCULAR

## 2015-01-02 ENCOUNTER — Telehealth: Payer: Self-pay

## 2015-01-02 NOTE — Telephone Encounter (Signed)
Pt states when she comes in for next b12 shot she will get her flu shot

## 2015-01-28 LAB — HM PAP SMEAR

## 2015-01-28 LAB — HM MAMMOGRAPHY: HM MAMMO: NORMAL (ref 0–4)

## 2015-02-12 ENCOUNTER — Telehealth: Payer: Self-pay | Admitting: Family

## 2015-02-12 DIAGNOSIS — R5383 Other fatigue: Secondary | ICD-10-CM

## 2015-02-12 NOTE — Telephone Encounter (Signed)
Pt stated she has a follow up with Marya Amsler on 02/19/15 but she is very tired and was wondering if Marya Amsler willing to put in lab work for her B12 tomorrow and give her injection? Please call pt back

## 2015-02-12 NOTE — Telephone Encounter (Signed)
Orders placed. Pt aware.

## 2015-02-13 ENCOUNTER — Other Ambulatory Visit (INDEPENDENT_AMBULATORY_CARE_PROVIDER_SITE_OTHER): Payer: 59

## 2015-02-13 ENCOUNTER — Telehealth: Payer: Self-pay | Admitting: *Deleted

## 2015-02-13 DIAGNOSIS — R5383 Other fatigue: Secondary | ICD-10-CM

## 2015-02-13 LAB — VITAMIN B12: Vitamin B-12: 287 pg/mL (ref 211–911)

## 2015-02-13 NOTE — Telephone Encounter (Signed)
Left msg on triage wanting lab results that was done yesterday.Rhonda [name] is out of office pls advise...Rhonda [name]

## 2015-02-13 NOTE — Telephone Encounter (Signed)
Vitamin B12 level is 287. Called patient and told her the results.    She would like to start the B12 injections again - please call her to schedule if you agree

## 2015-02-14 ENCOUNTER — Encounter: Payer: Self-pay | Admitting: Family

## 2015-02-16 ENCOUNTER — Ambulatory Visit (INDEPENDENT_AMBULATORY_CARE_PROVIDER_SITE_OTHER): Payer: 59

## 2015-02-16 DIAGNOSIS — E538 Deficiency of other specified B group vitamins: Secondary | ICD-10-CM

## 2015-02-16 MED ORDER — CYANOCOBALAMIN 1000 MCG/ML IJ SOLN
1000.0000 ug | Freq: Once | INTRAMUSCULAR | Status: AC
  Administered 2015-02-16: 1000 ug via INTRAMUSCULAR

## 2015-02-16 NOTE — Telephone Encounter (Signed)
Pt aware. Set up nurse visit for today

## 2015-02-16 NOTE — Telephone Encounter (Signed)
Please advise 

## 2015-02-16 NOTE — Telephone Encounter (Signed)
Patient would like to know if Dr. Quay Burow would ok her to have her B12 since Marya Amsler is out of the office.  Can call patient at [phone]

## 2015-02-16 NOTE — Telephone Encounter (Signed)
Yes, ok 

## 2015-02-17 ENCOUNTER — Telehealth: Payer: Self-pay | Admitting: Family

## 2015-02-17 NOTE — Telephone Encounter (Signed)
yes

## 2015-02-17 NOTE — Telephone Encounter (Signed)
Pt called in and said that she would like a female dr and would like to transfer from [facility] to Qu[facility] Burow  It this ok with both of you?

## 2015-02-18 ENCOUNTER — Ambulatory Visit: Payer: 59 | Admitting: Family

## 2015-02-19 ENCOUNTER — Ambulatory Visit: Payer: 59 | Admitting: Family

## 2015-02-19 NOTE — Telephone Encounter (Signed)
Ok with me 

## 2015-02-25 ENCOUNTER — Ambulatory Visit (INDEPENDENT_AMBULATORY_CARE_PROVIDER_SITE_OTHER): Payer: 59 | Admitting: Family

## 2015-02-25 ENCOUNTER — Encounter: Payer: Self-pay | Admitting: Family

## 2015-02-25 VITALS — BP 106/66 | HR 91 | Temp 98.1°F | Resp 16 | Ht 65.0 in | Wt 198.0 lb

## 2015-02-25 DIAGNOSIS — J069 Acute upper respiratory infection, unspecified: Secondary | ICD-10-CM | POA: Diagnosis not present

## 2015-02-25 DIAGNOSIS — E538 Deficiency of other specified B group vitamins: Secondary | ICD-10-CM | POA: Diagnosis not present

## 2015-02-25 MED ORDER — CYANOCOBALAMIN 1000 MCG/ML IJ SOLN: 1000.0000 ug | INTRAMUSCULAR | Status: DC

## 2015-02-25 NOTE — Progress Notes (Signed)
Subjective:    Patient ID: Rhonda [name], female    DOB: 1970-03-13, 45 y.o.   MRN: [id number]  Chief Complaint  Patient presents with  . Follow-up    feels alot better since injection, feels a cold coming on, ears are popping, cough up green mucus this morning, scratchy throat    HPI:  Rhonda [name] is a 45 y.o. female who  has a past medical history of Allergic rhinitis; Dysfunction of eustachian tube; Allergy; B12 deficiency; and Carpal tunnel syndrome. and presents today for a follow up office visit.   1.) B12 deficiency - Currently maintained on B12 injections monthly. Takes the medication as prescribed and denies adverse side effects. Reports significant improvement in her fatigue. Recent B12 lab work was within the normal limits.   2.) Cold symptoms -  This is a new problem. Associated symptom of a scratchy throat, ear popping and discomfort and coughing up purulent sputum has been going on for about 2 days. Modifying factors include Tylenol sinus which has helped a little but has not completely resolved her symptoms. Denies fevers. Denies recent antibiotic use.  Allergies  Allergen Reactions  . Oseltamivir Phosphate     REACTION: swelling  . Amoxicillin Rash  . Doxycycline Rash     Current Outpatient Prescriptions on File Prior to Visit  Medication Sig Dispense Refill  . spironolactone (ALDACTONE) 25 MG tablet Take 25 mg by mouth daily.     No current facility-administered medications on file prior to visit.    Review of Systems  Constitutional: Negative for fever and chills.  HENT: Positive for congestion, sneezing and sore throat.   Respiratory: Positive for cough. Negative for chest tightness and shortness of breath.   Neurological: Negative for headaches.      Objective:    BP 106/66 mmHg  Pulse 91  Temp(Src) 98.1 F (36.7 C) (Oral)  Resp 16  Ht 5\' 5"  (1.651 m)  Wt 198 lb (89.812 kg)  BMI 32.95 kg/m2  SpO2 98% Nursing note and vital signs  reviewed.  Physical Exam  Constitutional: She is oriented to person, place, and time. She appears well-developed and well-nourished. No distress.  HENT:  Right Ear: Hearing, tympanic membrane, external ear and ear canal normal.  Left Ear: Hearing, tympanic membrane, external ear and ear canal normal.  Nose: Right sinus exhibits no maxillary sinus tenderness and no frontal sinus tenderness. Left sinus exhibits no maxillary sinus tenderness and no frontal sinus tenderness.  Mouth/Throat: Uvula is midline, oropharynx is clear and moist and mucous membranes are normal.  Cardiovascular: Normal rate, regular rhythm, normal heart sounds and intact distal pulses.   Pulmonary/Chest: Effort normal and breath sounds normal.  Neurological: She is alert and oriented to person, place, and time.  Skin: Skin is warm and dry.  Psychiatric: She has a normal mood and affect. Her behavior is normal. Judgment and thought content normal.       Assessment & Plan:   Problem List Items Addressed This Visit      Respiratory   Upper respiratory infection    Symptoms and exam consistent with acute upper respiratory infection. Continue conservative treatment with over-the-counter medications as needed for symptom relief and supportive care. Follow-up if symptoms worsen or fail to improve.        Digestive   Vitamin B 12 deficiency - Primary    Stable with current dosage of vitamin B12 injections monthly. Reports significant improvement in her symptoms and denies adverse side effects.  Discussed potential B12 injections at home. Patient will schedule nurse visit for next B12 injection with teaching instructions on how to give injection and administer injection herself if indicated. Continue current dosage of B12. Follow-up if symptoms worsen or no longer controlled.      Relevant Medications   cyanocobalamin (,VITAMIN B-12,) 1000 MCG/ML injection

## 2015-02-25 NOTE — Progress Notes (Signed)
Pre visit review using our clinic review tool, if applicable. No additional management support is needed unless otherwise documented below in the visit note. 

## 2015-02-25 NOTE — Assessment & Plan Note (Signed)
Symptoms and exam consistent with acute upper respiratory infection. Continue conservative treatment with over-the-counter medications as needed for symptom relief and supportive care. Follow-up if symptoms worsen or fail to improve.

## 2015-02-25 NOTE — Patient Instructions (Signed)
Thank you for choosing [facility].  Summary/Instructions:  Your prescription(s) have been submitted to your pharmacy or been printed and provided for you. Please take as directed and contact our office if you believe you are having problem(s) with the medication(s) or have any questions.  If your symptoms worsen or fail to improve, please contact our office for further instruction, or in case of emergency go directly to the emergency room at the closest medical facility.   Continue to take your B12 injections.  General Recommendations:    Please drink plenty of fluids.  Get plenty of rest   Sleep in humidified air  Use saline nasal sprays  Netti pot   OTC Medications:  Decongestants - helps relieve congestion   Flonase (generic fluticasone) or Nasacort (generic triamcinolone) - please make sure to use the "cross-over" technique at a 45 degree angle towards the opposite eye as opposed to straight up the nasal passageway.   Sudafed (generic pseudoephedrine - Note this is the one that is available behind the pharmacy counter); Products with phenylephrine (-PE) may also be used but is often not as effective as pseudoephedrine.   If you have HIGH BLOOD PRESSURE - Coricidin HBP; AVOID any product that is -D as this contains pseudoephedrine which may increase your blood pressure.  Afrin (oxymetazoline) every 6-8 hours for up to 3 days.   Allergies - helps relieve runny nose, itchy eyes and sneezing   Claritin (generic loratidine), Allegra (fexofenidine), or Zyrtec (generic cyrterizine) for runny nose. These medications should not cause drowsiness.  Note - Benadryl (generic diphenhydramine) may be used however may cause drowsiness  Cough -   Delsym or Robitussin (generic dextromethorphan)  Expectorants - helps loosen mucus to ease removal   Mucinex (generic guaifenesin) as directed on the package.  Headaches / General Aches   Tylenol (generic acetaminophen) -  DO NOT EXCEED 3 grams (3,000 mg) in a 24 hour time period  Advil/Motrin (generic ibuprofen)   Sore Throat -   Salt water gargle   Chloraseptic (generic benzocaine) spray or lozenges / Sucrets (generic dyclonine)

## 2015-02-25 NOTE — Assessment & Plan Note (Signed)
Stable with current dosage of vitamin B12 injections monthly. Reports significant improvement in her symptoms and denies adverse side effects. Discussed potential B12 injections at home. Patient will schedule nurse visit for next B12 injection with teaching instructions on how to give injection and administer injection herself if indicated. Continue current dosage of B12. Follow-up if symptoms worsen or no longer controlled.

## 2015-03-16 ENCOUNTER — Ambulatory Visit (INDEPENDENT_AMBULATORY_CARE_PROVIDER_SITE_OTHER): Payer: 59

## 2015-03-16 DIAGNOSIS — E538 Deficiency of other specified B group vitamins: Secondary | ICD-10-CM | POA: Diagnosis not present

## 2015-03-16 MED ORDER — CYANOCOBALAMIN 1000 MCG/ML IJ SOLN
1000.0000 ug | Freq: Once | INTRAMUSCULAR | Status: AC
  Administered 2015-03-16: 1000 ug via INTRAMUSCULAR

## 2015-04-13 ENCOUNTER — Ambulatory Visit (INDEPENDENT_AMBULATORY_CARE_PROVIDER_SITE_OTHER): Payer: 59

## 2015-04-13 DIAGNOSIS — E538 Deficiency of other specified B group vitamins: Secondary | ICD-10-CM

## 2015-04-13 MED ORDER — CYANOCOBALAMIN 1000 MCG/ML IJ SOLN
1000.0000 ug | Freq: Once | INTRAMUSCULAR | Status: AC
  Administered 2015-04-13: 1000 ug via INTRAMUSCULAR

## 2015-04-14 ENCOUNTER — Other Ambulatory Visit (INDEPENDENT_AMBULATORY_CARE_PROVIDER_SITE_OTHER): Payer: 59

## 2015-04-14 ENCOUNTER — Encounter: Payer: Self-pay | Admitting: Family

## 2015-04-14 ENCOUNTER — Ambulatory Visit (INDEPENDENT_AMBULATORY_CARE_PROVIDER_SITE_OTHER): Payer: 59 | Admitting: Family

## 2015-04-14 VITALS — BP 100/60 | HR 95 | Temp 98.0°F | Resp 14 | Ht 65.0 in | Wt 200.8 lb

## 2015-04-14 DIAGNOSIS — R202 Paresthesia of skin: Secondary | ICD-10-CM

## 2015-04-14 DIAGNOSIS — R200 Anesthesia of skin: Secondary | ICD-10-CM

## 2015-04-14 LAB — HEMOGLOBIN A1C: HEMOGLOBIN A1C: 5.5 % (ref 4.6–6.5)

## 2015-04-14 LAB — CBC
HEMATOCRIT: 39.9 % (ref 36.0–46.0)
Hemoglobin: 13.6 g/dL (ref 12.0–15.0)
MCHC: 33.9 g/dL (ref 30.0–36.0)
MCV: 88.7 fl (ref 78.0–100.0)
Platelets: 430 10*3/uL — ABNORMAL HIGH (ref 150.0–400.0)
RBC: 4.5 Mil/uL (ref 3.87–5.11)
RDW: 13.5 % (ref 11.5–15.5)
WBC: 7.8 10*3/uL (ref 4.0–10.5)

## 2015-04-14 NOTE — Assessment & Plan Note (Signed)
Numbness and tingling possibly related to diabetes, B12 deficiency or possible anemias. Obtain CBC and A1c. If related to B12 will need to retest as most recent injection within the last 24 hours. Follow up pending blood work results.

## 2015-04-14 NOTE — Progress Notes (Signed)
   Subjective:    Patient ID: Rhonda [name], female    DOB: 01-01-1971, 45 y.o.   MRN: [id number]  Chief Complaint  Patient presents with  . A1c check    wants to be checked for diabetes, has been having hand and feet numbness    HPI:  Rhonda [name] is a 45 y.o. female who  has a past medical history of Allergic rhinitis; Dysfunction of eustachian tube; Allergy; B12 deficiency; and Carpal tunnel syndrome. and presents today for an office visit.   This is a new problem. Associated symptom of numbness and tingling located in her hands and feet bilaterally that has been going on for about 1 month. Numbness and tingling waxes and wanes with increased frequency as of late. She is on B12 injections with the most recent injection yesterday. She has had some increased fatigue. Does not that she has had polyphasia, polydipisia and polyuria.   Allergies  Allergen Reactions  . Oseltamivir Phosphate     REACTION: swelling  . Amoxicillin Rash  . Doxycycline Rash     Current Outpatient Prescriptions on File Prior to Visit  Medication Sig Dispense Refill  . cyanocobalamin (,VITAMIN B-12,) 1000 MCG/ML injection Inject 1 mL (1,000 mcg total) into the muscle every 30 (thirty) days. 10 mL 0   No current facility-administered medications on file prior to visit.    Review of Systems  Constitutional: Positive for chills. Negative for fever.  Eyes:       Negative for changes in vision.  Endocrine: Positive for polydipsia, polyphagia and polyuria. Negative for cold intolerance and heat intolerance.  Neurological: Positive for numbness. Negative for weakness.      Objective:    BP 100/60 mmHg  Pulse 95  Temp(Src) 98 F (36.7 C) (Oral)  Resp 14  Ht 5\' 5"  (1.651 m)  Wt 200 lb 12.8 oz (91.082 kg)  BMI 33.41 kg/m2  SpO2 98% Nursing note and vital signs reviewed.  Physical Exam  Constitutional: She is oriented to person, place, and time. She appears well-developed and well-nourished. No  distress.  Cardiovascular: Normal rate, regular rhythm, normal heart sounds and intact distal pulses.   Pulmonary/Chest: Effort normal and breath sounds normal.  Neurological: She is alert and oriented to person, place, and time.  Extremities with 4+ muscle strength and sensation intact.   Skin: Skin is warm and dry.  Psychiatric: She has a normal mood and affect. Her behavior is normal. Judgment and thought content normal.       Assessment & Plan:   Problem List Items Addressed This Visit      Other   Numbness and tingling - Primary    Numbness and tingling possibly related to diabetes, B12 deficiency or possible anemias. Obtain CBC and A1c. If related to B12 will need to retest as most recent injection within the last 24 hours. Follow up pending blood work results.       Relevant Orders   Hemoglobin A1c (Completed)   CBC (Completed)

## 2015-04-14 NOTE — Patient Instructions (Signed)
Thank you for choosing [facility].  Summary/Instructions:  Please stop by the lab on the basement level of the building for your blood work. Your results will be released to MyChart (or called to you) after review, usually within 72 hours after test completion. If any changes need to be made, you will be notified at that same time.  If your symptoms worsen or fail to improve, please contact our office for further instruction, or in case of emergency go directly to the emergency room at the closest medical facility.     

## 2015-04-14 NOTE — Progress Notes (Signed)
Pre visit review using our clinic review tool, if applicable. No additional management support is needed unless otherwise documented below in the visit note. 

## 2015-04-22 ENCOUNTER — Telehealth: Payer: Self-pay | Admitting: Family

## 2015-04-22 ENCOUNTER — Ambulatory Visit (INDEPENDENT_AMBULATORY_CARE_PROVIDER_SITE_OTHER): Payer: 59 | Admitting: Nurse Practitioner

## 2015-04-22 ENCOUNTER — Encounter: Payer: Self-pay | Admitting: Nurse Practitioner

## 2015-04-22 VITALS — BP 112/76 | HR 107 | Temp 100.0°F | Ht 65.0 in | Wt 198.5 lb

## 2015-04-22 DIAGNOSIS — R6889 Other general symptoms and signs: Secondary | ICD-10-CM | POA: Diagnosis not present

## 2015-04-22 LAB — POCT INFLUENZA A/B
INFLUENZA A, POC: POSITIVE — AB
INFLUENZA B, POC: NEGATIVE

## 2015-04-22 MED ORDER — AZITHROMYCIN 250 MG PO TABS: ORAL_TABLET | ORAL | Status: DC

## 2015-04-22 MED ORDER — PROMETHAZINE-CODEINE 6.25-10 MG/5ML PO SYRP: 5.0000 mL | ORAL_SOLUTION | Freq: Every evening | ORAL | Status: DC | PRN

## 2015-04-22 MED ORDER — ONDANSETRON HCL 4 MG PO TABS: 4.0000 mg | ORAL_TABLET | Freq: Three times a day (TID) | ORAL | Status: DC | PRN

## 2015-04-22 NOTE — Patient Instructions (Signed)

## 2015-04-22 NOTE — Progress Notes (Signed)
Patient ID: Rhonda [name], female    DOB: [birth year]  Age: 45 y.o. MRN: [id number]  CC: Flu Like Symptoms.   HPI Rhonda [name] presents for CC of flu-like symptoms x 5 days.   1) Pt has had body aches, vomiting (Monday), fever, experiences chills, nausea, vomiting, anorexia   Treatment to date: Coke, saltines, ginger ale, chicken noodle soup   Sick contacts: co-workers   History Rhonda [name] has a past medical history of Allergic rhinitis; Dysfunction of eustachian tube; Allergy; B12 deficiency; and Carpal tunnel syndrome.   She has past surgical history that includes Tonsillectomy.   Her family history includes Diabetes in her father; Heart disease in her father and maternal grandmother; Hypertension in her father.She reports that she has never smoked. She has never used smokeless tobacco. She reports that she does not drink alcohol or use illicit drugs.  Outpatient Prescriptions Prior to Visit  Medication Sig Dispense Refill  . cyanocobalamin (,VITAMIN B-12,) 1000 MCG/ML injection Inject 1 mL (1,000 mcg total) into the muscle every 30 (thirty) days. 10 mL 0   No facility-administered medications prior to visit.    ROS Review of Systems  Constitutional: Positive for chills, diaphoresis, appetite change and fatigue. Negative for fever.  HENT: Positive for congestion and sore throat.   Respiratory: Positive for cough. Negative for chest tightness, shortness of breath and wheezing.   Cardiovascular: Negative for chest pain, palpitations and leg swelling.  Gastrointestinal: Positive for nausea and vomiting. Negative for diarrhea.  Musculoskeletal: Positive for myalgias.  Skin: Negative for rash.  Neurological: Negative for dizziness and headaches.    Objective:  BP 112/76 mmHg  Pulse 107  Temp(Src) 100 F (37.8 C) (Oral)  Ht 5\' 5"  (1.651 m)  Wt 198 lb 8 oz (90.039 kg)  BMI 33.03 kg/m2  SpO2 98%  Physical Exam  Constitutional: She is oriented to person, place, and time.  She appears well-developed and well-nourished. No distress.  HENT:  Head: Normocephalic and atraumatic.  Right Ear: External ear normal.  Left Ear: External ear normal.  Mouth/Throat: Oropharynx is clear and moist. No oropharyngeal exudate.  Boggy turbinates  TMs clear bilaterally  Eyes: EOM are normal. Pupils are equal, round, and reactive to light. Right eye exhibits no discharge. Left eye exhibits no discharge. No scleral icterus.  Neck: Normal range of motion. Neck supple.  Cardiovascular: Regular rhythm.   Slightly tachycardic  Pulmonary/Chest: Effort normal and breath sounds normal. No respiratory distress. She has no wheezes. She has no rales. She exhibits no tenderness.  Lymphadenopathy:    She has no cervical adenopathy.  Neurological: She is alert and oriented to person, place, and time.  Skin: Skin is warm. No rash noted. She is diaphoretic.  Psychiatric: She has a normal mood and affect. Her behavior is normal. Judgment and thought content normal.      Assessment & Plan:   Rhonda [name] was seen today for flu like symptoms..  Diagnoses and all orders for this visit:  Flu-like symptoms -     POCT Influenza A/B  Other orders -     azithromycin (ZITHROMAX) 250 MG tablet; Take 2 tablets by mouth on day 1, take 1 tablet by mouth each day after for 4 days. -     ondansetron (ZOFRAN) 4 MG tablet; Take 1 tablet (4 mg total) by mouth every 8 (eight) hours as needed for nausea or vomiting. -     promethazine-codeine (PHENERGAN WITH CODEINE) 6.25-10 MG/5ML syrup; Take 5 mLs by mouth at  bedtime as needed for cough.   I am having Rhonda [name] start on azithromycin, ondansetron, and promethazine-codeine. I am also having her maintain her cyanocobalamin.  Meds ordered this encounter  Medications  . azithromycin (ZITHROMAX) 250 MG tablet    Sig: Take 2 tablets by mouth on day 1, take 1 tablet by mouth each day after for 4 days.    Dispense:  6 each    Refill:  0    Order Specific  Question:  Supervising Provider    Answer:  Deborra Medina L [[id number]]  . ondansetron (ZOFRAN) 4 MG tablet    Sig: Take 1 tablet (4 mg total) by mouth every 8 (eight) hours as needed for nausea or vomiting.    Dispense:  20 tablet    Refill:  0    Order Specific Question:  Supervising Provider    Answer:  Deborra Medina L [[id number]]  . promethazine-codeine (PHENERGAN WITH CODEINE) 6.25-10 MG/5ML syrup    Sig: Take 5 mLs by mouth at bedtime as needed for cough.    Dispense:  120 mL    Refill:  0    Order Specific Question:  Supervising Provider    Answer:  Crecencio Mc [[id number]]     Follow-up: Return if symptoms worsen or fail to improve.

## 2015-04-22 NOTE — Telephone Encounter (Signed)
Patient Name: Rhonda [name]  DOB: [birth year]    Initial Comment Caller states she is has chills- head congestion and a cough   Nurse Assessment  Nurse: Leilani Merl, RN, Heather Date/Time (Eastern Time): 04/22/2015 8:20:50 AM  Confirm and document reason for call. If symptomatic, describe symptoms. You must click the next button to save text entered. ---Caller states she is has chills- head congestion and a cough, that started on Saturday  Has the patient traveled out of the country within the last 30 days? ---Not Applicable  Does the patient have any new or worsening symptoms? ---Yes  Will a triage be completed? ---Yes  Related visit to physician within the last 2 weeks? ---No  Does the PT have any chronic conditions? (i.e. diabetes, asthma, etc.) ---No  Is the patient pregnant or possibly pregnant? (Ask all females between the ages of 65-55) ---No  Is this a behavioral health or substance abuse call? ---No     Guidelines    Guideline Title Affirmed Question Affirmed Notes  Cough - Acute Non-Productive [1] Continuous (nonstop) coughing interferes with work or school AND [2] no improvement using cough treatment per protocol    Final Disposition User   See Physician within 24 Hours [place], Therapist, sports, Water quality scientist    Comments  Appt made with Lorane Gell today at 3:15pm.   Referrals  REFERRED TO PCP OFFICE   Disagree/Comply: Comply

## 2015-04-22 NOTE — Progress Notes (Signed)
Pre visit review using our clinic review tool, if applicable. No additional management support is needed unless otherwise documented below in the visit note. 

## 2015-04-26 DIAGNOSIS — R6889 Other general symptoms and signs: Secondary | ICD-10-CM | POA: Insufficient documentation

## 2015-04-26 NOTE — Assessment & Plan Note (Signed)
Positive for flu  Supportive treatment of phenergan with codeine to help with nausea and discomfort and aide in sleeping.  Z-pack sent to pharmacy  Zofran for other nausea help- when not sleeping or wanting to sleep

## 2015-05-18 ENCOUNTER — Telehealth: Payer: Self-pay

## 2015-05-18 ENCOUNTER — Ambulatory Visit (INDEPENDENT_AMBULATORY_CARE_PROVIDER_SITE_OTHER): Payer: 59

## 2015-05-18 DIAGNOSIS — E538 Deficiency of other specified B group vitamins: Secondary | ICD-10-CM | POA: Diagnosis not present

## 2015-05-18 MED ORDER — TERCONAZOLE 0.8 % VA CREA: 1.0000 | TOPICAL_CREAM | Freq: Every day | VAGINAL | Status: DC

## 2015-05-18 MED ORDER — CYANOCOBALAMIN 1000 MCG/ML IJ SOLN
1000.0000 ug | Freq: Once | INTRAMUSCULAR | Status: AC
  Administered 2015-05-18: 1000 ug via INTRAMUSCULAR

## 2015-05-18 NOTE — Telephone Encounter (Signed)
Medication sent to pharmacy  

## 2015-05-18 NOTE — Telephone Encounter (Signed)
Spoke with patient have them schedule in may with Dr.Jone

## 2015-05-18 NOTE — Telephone Encounter (Signed)
Patient was recently on antibiotic zithromax---now has yeast infection---can you please send tecanozole cream to her pharm, [facility] on [address]----please advise, thanks

## 2015-05-18 NOTE — Telephone Encounter (Signed)
Patient feel she has better chemistry with Dr. Ronnald Ramp and would like to tranfer. Dr. Ronnald Ramp is that ok with you? Dr. Elna Breslow is that ok with you?

## 2015-05-18 NOTE — Telephone Encounter (Signed)
Ok with me 

## 2015-06-10 ENCOUNTER — Encounter: Payer: Self-pay | Admitting: Internal Medicine

## 2015-06-10 ENCOUNTER — Ambulatory Visit (INDEPENDENT_AMBULATORY_CARE_PROVIDER_SITE_OTHER): Payer: 59 | Admitting: Internal Medicine

## 2015-06-10 VITALS — BP 102/70 | HR 81 | Temp 98.4°F | Resp 16 | Ht 65.0 in | Wt 200.0 lb

## 2015-06-10 DIAGNOSIS — Z0000 Encounter for general adult medical examination without abnormal findings: Secondary | ICD-10-CM

## 2015-06-10 DIAGNOSIS — K5909 Other constipation: Secondary | ICD-10-CM

## 2015-06-10 DIAGNOSIS — E538 Deficiency of other specified B group vitamins: Secondary | ICD-10-CM | POA: Diagnosis not present

## 2015-06-10 DIAGNOSIS — Z0001 Encounter for general adult medical examination with abnormal findings: Secondary | ICD-10-CM

## 2015-06-10 DIAGNOSIS — J301 Allergic rhinitis due to pollen: Secondary | ICD-10-CM

## 2015-06-10 MED ORDER — LUBIPROSTONE 24 MCG PO CAPS: 24.0000 ug | ORAL_CAPSULE | Freq: Two times a day (BID) | ORAL | Status: DC

## 2015-06-10 MED ORDER — CYANOCOBALAMIN 500 MCG/0.1ML NA SOLN: 500.0000 ug | NASAL | Status: DC

## 2015-06-10 MED ORDER — FLUTICASONE PROPIONATE 50 MCG/ACT NA SUSP: 2.0000 | Freq: Every day | NASAL | Status: DC

## 2015-06-10 MED ORDER — CETIRIZINE-PSEUDOEPHEDRINE ER 5-120 MG PO TB12: 1.0000 | ORAL_TABLET | Freq: Two times a day (BID) | ORAL | Status: DC

## 2015-06-10 NOTE — Progress Notes (Signed)
Pre visit review using our clinic review tool, if applicable. No additional management support is needed unless otherwise documented below in the visit note. 

## 2015-06-10 NOTE — Progress Notes (Signed)
Subjective:  Patient ID: Rhonda [name], female    DOB: April 15, 1970  Age: 45 y.o. MRN: [id number]  CC: Annual Exam; Allergic Rhinitis ; and Constipation  NEW TO ME  HPI Rhonda [name] presents for a CPX.  She is being treated for B12 deficiency and complains that it's inconvenient to come in once a month and get a B12 injection. She also complains of a one year history of worsening constipation and straining. She tells me she can only have a bowel movement about once every 2-3 days. She developed a sinus infection about a year ago and she says that damaged her sense of smell and taste. She has been followed by an ENT surgeon. She complains of persistent nasal symptoms with congestion, sneezing, and postnasal drip. She has tried plain Zyrtec with no relief from her symptoms.  History Rhonda [name] has a past medical history of Allergic rhinitis; Dysfunction of eustachian tube; Allergy; B12 deficiency; and Carpal tunnel syndrome.   She has past surgical history that includes Tonsillectomy.   Her family history includes Diabetes in her father; Heart disease in her father and maternal grandmother; Hypertension in her father.She reports that she has never smoked. She has never used smokeless tobacco. She reports that she does not drink alcohol or use illicit drugs.  Outpatient Prescriptions Prior to Visit  Medication Sig Dispense Refill  . terconazole (TERAZOL 3) 0.8 % vaginal cream Place 1 applicator vaginally at bedtime. (Patient not taking: Reported on 06/10/2015) 20 g 0  . azithromycin (ZITHROMAX) 250 MG tablet Take 2 tablets by mouth on day 1, take 1 tablet by mouth each day after for 4 days. 6 each 0  . cyanocobalamin (,VITAMIN B-12,) 1000 MCG/ML injection Inject 1 mL (1,000 mcg total) into the muscle every 30 (thirty) days. (Patient not taking: Reported on 06/10/2015) 10 mL 0  . ondansetron (ZOFRAN) 4 MG tablet Take 1 tablet (4 mg total) by mouth every 8 (eight) hours as needed for nausea or  vomiting. (Patient not taking: Reported on 06/10/2015) 20 tablet 0  . promethazine-codeine (PHENERGAN WITH CODEINE) 6.25-10 MG/5ML syrup Take 5 mLs by mouth at bedtime as needed for cough. 120 mL 0   No facility-administered medications prior to visit.    ROS Review of Systems  Constitutional: Negative.   HENT: Positive for congestion, postnasal drip, rhinorrhea and sneezing. Negative for dental problem, ear pain, facial swelling, nosebleeds, sinus pressure, sore throat, tinnitus, trouble swallowing and voice change.   Eyes: Negative.   Respiratory: Negative.  Negative for cough, choking, chest tightness, shortness of breath and stridor.   Cardiovascular: Negative.  Negative for chest pain, palpitations and leg swelling.  Gastrointestinal: Positive for constipation. Negative for nausea, vomiting, abdominal pain, diarrhea, blood in stool and abdominal distention.  Endocrine: Negative.   Genitourinary: Negative.   Musculoskeletal: Negative.   Skin: Negative.  Negative for rash.  Allergic/Immunologic: Negative.   Neurological: Negative.  Negative for dizziness, weakness and headaches.  Hematological: Negative.  Negative for adenopathy. Does not bruise/bleed easily.  Psychiatric/Behavioral: Negative.     Objective:  BP 102/70 mmHg  Pulse 81  Temp(Src) 98.4 F (36.9 C) (Oral)  Resp 16  Ht 5\' 5"  (1.651 m)  Wt 200 lb (90.719 kg)  BMI 33.28 kg/m2  SpO2 98%  LMP 06/04/2015  Physical Exam  Constitutional: She is oriented to person, place, and time. She appears well-developed and well-nourished. No distress.  HENT:  Nose: Mucosal edema present. No rhinorrhea or sinus tenderness. No epistaxis. Right  sinus exhibits no maxillary sinus tenderness and no frontal sinus tenderness. Left sinus exhibits no maxillary sinus tenderness and no frontal sinus tenderness.  Mouth/Throat: Oropharynx is clear and moist and mucous membranes are normal. Mucous membranes are not pale, not dry and not cyanotic.  No oropharyngeal exudate, posterior oropharyngeal edema, posterior oropharyngeal erythema or tonsillar abscesses.  Eyes: Conjunctivae are normal. Right eye exhibits no discharge. Left eye exhibits no discharge. No scleral icterus.  Neck: Normal range of motion. Neck supple. No JVD present. No tracheal deviation present. No thyromegaly present.  Cardiovascular: Normal rate, regular rhythm, normal heart sounds and intact distal pulses.  Exam reveals no gallop and no friction rub.   No murmur heard. Pulmonary/Chest: Effort normal and breath sounds normal. No stridor. No respiratory distress. She has no wheezes. She has no rales. She exhibits no tenderness.  Abdominal: Soft. Bowel sounds are normal. She exhibits no distension and no mass. There is no tenderness. There is no rebound and no guarding.  Musculoskeletal: Normal range of motion. She exhibits no edema or tenderness.  Lymphadenopathy:    She has no cervical adenopathy.  Neurological: She is oriented to person, place, and time.  Skin: Skin is warm and dry. No rash noted. She is not diaphoretic. No erythema. No pallor.  Vitals reviewed.   . Lab Results  Component Value Date   WBC 7.8 04/14/2015   HGB 13.6 04/14/2015   HCT 39.9 04/14/2015   PLT 430.0* 04/14/2015   GLUCOSE 103* 07/14/2014   CHOL 176 07/14/2014   TRIG 73.0 07/14/2014   HDL 56.90 07/14/2014   LDLCALC 105* 07/14/2014   ALT 22 07/05/2013   AST 25 07/05/2013   NA 136 07/14/2014   K 3.9 07/14/2014   CL 101 07/14/2014   CREATININE 0.80 07/14/2014   BUN 8 07/14/2014   CO2 28 07/14/2014   TSH 0.63 07/14/2014   HGBA1C 5.5 04/14/2015    Assessment & Plan:   Rhonda [name] was seen today for annual exam, allergic rhinitis  and constipation.  Diagnoses and all orders for this visit:  Routine general medical examination at a health care facility- exam completed, labs ordered and reviewed, vaccines were reviewed, her Pap smear and mammogram are up-to-date, patient education  material was given. -     Comprehensive metabolic panel; Future -     CBC with Differential/Platelet; Future -     Lipid panel; Future -     TSH; Future  Vitamin B 12 deficiency- for her convenience will change to a weekly B12 nasal spray -     Cyanocobalamin (NASCOBAL) 500 MCG/0.1ML SOLN; Place 0.1 mLs (500 mcg total) into the nose once a week.  Allergic rhinitis due to pollen- I've asked her to try Zyrtec-D and Flonase nasal spray to treat the symptoms. -     cetirizine-pseudoephedrine (ZYRTEC-D ALLERGY & CONGESTION) 5-120 MG tablet; Take 1 tablet by mouth 2 (two) times daily. -     fluticasone (FLONASE) 50 MCG/ACT nasal spray; Place 2 sprays into both nostrils daily.  Other constipation- will check her labs to screen for secondary metabolic causes, I do not see anything on her medication list that would cause this, will treat empirically for idiopathic constipation with Amitiza. -     Magnesium; Future -     T4; Future -     lubiprostone (AMITIZA) 24 MCG capsule; Take 1 capsule (24 mcg total) by mouth 2 (two) times daily with a meal.  I have discontinued Ms. Brucato's cyanocobalamin, azithromycin, ondansetron,  and promethazine-codeine. I am also having her start on cetirizine-pseudoephedrine, fluticasone, lubiprostone, and Cyanocobalamin. Additionally, I am having her maintain her terconazole.  Meds ordered this encounter  Medications  . cetirizine-pseudoephedrine (ZYRTEC-D ALLERGY & CONGESTION) 5-120 MG tablet    Sig: Take 1 tablet by mouth 2 (two) times daily.    Dispense:  60 tablet    Refill:  11  . fluticasone (FLONASE) 50 MCG/ACT nasal spray    Sig: Place 2 sprays into both nostrils daily.    Dispense:  16 g    Refill:  11  . lubiprostone (AMITIZA) 24 MCG capsule    Sig: Take 1 capsule (24 mcg total) by mouth 2 (two) times daily with a meal.    Dispense:  180 capsule    Refill:  3  . Cyanocobalamin (NASCOBAL) 500 MCG/0.1ML SOLN    Sig: Place 0.1 mLs (500 mcg total) into the  nose once a week.    Dispense:  1.3 mL    Refill:  11     Follow-up: Return in about 3 months (around 09/10/2015).  Scarlette Calico, MD

## 2015-06-10 NOTE — Patient Instructions (Signed)

## 2015-06-11 ENCOUNTER — Other Ambulatory Visit (INDEPENDENT_AMBULATORY_CARE_PROVIDER_SITE_OTHER): Payer: 59

## 2015-06-11 DIAGNOSIS — K5909 Other constipation: Secondary | ICD-10-CM

## 2015-06-11 DIAGNOSIS — Z0000 Encounter for general adult medical examination without abnormal findings: Secondary | ICD-10-CM

## 2015-06-11 LAB — COMPREHENSIVE METABOLIC PANEL
ALBUMIN: 3.9 g/dL (ref 3.5–5.2)
ALK PHOS: 83 U/L (ref 39–117)
ALT: 11 U/L (ref 0–35)
AST: 13 U/L (ref 0–37)
BILIRUBIN TOTAL: 0.9 mg/dL (ref 0.2–1.2)
BUN: 13 mg/dL (ref 6–23)
CO2: 26 mEq/L (ref 19–32)
CREATININE: 0.81 mg/dL (ref 0.40–1.20)
Calcium: 8.9 mg/dL (ref 8.4–10.5)
Chloride: 106 mEq/L (ref 96–112)
GFR: 98.01 mL/min (ref >60.00)
Glucose, Bld: 96 mg/dL (ref 70–99)
POTASSIUM: 4.5 meq/L (ref 3.5–5.1)
SODIUM: 138 meq/L (ref 135–145)
TOTAL PROTEIN: 7 g/dL (ref 6.0–8.3)

## 2015-06-11 LAB — CBC WITH DIFFERENTIAL/PLATELET
BASOS ABS: 0 10*3/uL (ref 0.0–0.1)
Basophils Relative: 0.5 % (ref 0.0–3.0)
EOS ABS: 0.1 10*3/uL (ref 0.0–0.7)
Eosinophils Relative: 1.1 % (ref 0.0–5.0)
HCT: 38.5 % (ref 36.0–46.0)
HEMOGLOBIN: 13.2 g/dL (ref 12.0–15.0)
LYMPHS ABS: 2.2 10*3/uL (ref 0.7–4.0)
Lymphocytes Relative: 30.9 % (ref 12.0–46.0)
MCHC: 34.3 g/dL (ref 30.0–36.0)
MCV: 88.7 fl (ref 78.0–100.0)
MONO ABS: 0.5 10*3/uL (ref 0.1–1.0)
Monocytes Relative: 6.5 % (ref 3.0–12.0)
NEUTROS PCT: 61 % (ref 43.0–77.0)
Neutro Abs: 4.4 10*3/uL (ref 1.4–7.7)
Platelets: 384 10*3/uL (ref 150.0–400.0)
RBC: 4.34 Mil/uL (ref 3.87–5.11)
RDW: 14.1 % (ref 11.5–15.5)
WBC: 7.2 10*3/uL (ref 4.0–10.5)

## 2015-06-11 LAB — LIPID PANEL
CHOLESTEROL: 170 mg/dL (ref 0–200)
HDL: 55.9 mg/dL (ref >39.00)
LDL CALC: 107 mg/dL — AB (ref 0–99)
NonHDL: 114.47
TRIGLYCERIDES: 39 mg/dL (ref 0.0–149.0)
Total CHOL/HDL Ratio: 3
VLDL: 7.8 mg/dL (ref 0.0–40.0)

## 2015-06-11 LAB — MAGNESIUM: Magnesium: 2 mg/dL (ref 1.5–2.5)

## 2015-06-11 LAB — T4: T4 TOTAL: 7.5 ug/dL (ref 4.5–12.0)

## 2015-06-11 LAB — TSH: TSH: 1.31 u[IU]/mL (ref 0.35–4.50)

## 2015-06-12 ENCOUNTER — Encounter: Payer: Self-pay | Admitting: Internal Medicine

## 2015-06-15 ENCOUNTER — Ambulatory Visit: Payer: 59

## 2015-07-14 ENCOUNTER — Ambulatory Visit (INDEPENDENT_AMBULATORY_CARE_PROVIDER_SITE_OTHER): Payer: 59

## 2015-07-14 DIAGNOSIS — E538 Deficiency of other specified B group vitamins: Secondary | ICD-10-CM

## 2015-07-14 MED ORDER — CYANOCOBALAMIN 1000 MCG/ML IJ SOLN
1000.0000 ug | Freq: Once | INTRAMUSCULAR | Status: AC
  Administered 2015-07-14: 1000 ug via INTRAMUSCULAR

## 2015-08-14 ENCOUNTER — Ambulatory Visit (INDEPENDENT_AMBULATORY_CARE_PROVIDER_SITE_OTHER): Payer: 59

## 2015-08-14 DIAGNOSIS — E538 Deficiency of other specified B group vitamins: Secondary | ICD-10-CM

## 2015-08-14 MED ORDER — CYANOCOBALAMIN 1000 MCG/ML IJ SOLN
1000.0000 ug | Freq: Once | INTRAMUSCULAR | Status: AC
  Administered 2015-08-14: 1000 ug via INTRAMUSCULAR

## 2015-08-19 ENCOUNTER — Encounter: Payer: Self-pay | Admitting: Internal Medicine

## 2015-08-19 ENCOUNTER — Ambulatory Visit (INDEPENDENT_AMBULATORY_CARE_PROVIDER_SITE_OTHER): Payer: 59 | Admitting: Internal Medicine

## 2015-08-19 VITALS — BP 116/74 | HR 96 | Temp 97.9°F | Resp 16 | Ht 65.0 in | Wt 200.0 lb

## 2015-08-19 DIAGNOSIS — R1010 Upper abdominal pain, unspecified: Secondary | ICD-10-CM

## 2015-08-19 DIAGNOSIS — K589 Irritable bowel syndrome without diarrhea: Secondary | ICD-10-CM

## 2015-08-19 DIAGNOSIS — K219 Gastro-esophageal reflux disease without esophagitis: Secondary | ICD-10-CM | POA: Diagnosis not present

## 2015-08-19 MED ORDER — HYOSCYAMINE SULFATE ER 0.375 MG PO TB12: 0.3750 mg | ORAL_TABLET | Freq: Two times a day (BID) | ORAL | 3 refills | Status: DC

## 2015-08-19 MED ORDER — ESOMEPRAZOLE MAGNESIUM 40 MG PO CPDR: 40.0000 mg | DELAYED_RELEASE_CAPSULE | Freq: Every day | ORAL | 3 refills | Status: DC

## 2015-08-19 NOTE — Patient Instructions (Signed)

## 2015-08-19 NOTE — Progress Notes (Signed)
Subjective:  Patient ID: Rhonda Rhonda [name], female    DOB: 02-Feb-1970  Age: 45 y.o. MRN: [id number]  CC: Abdominal Pain and Gastroesophageal Reflux   HPI Rhonda Rhonda [name] presents for a one-month history of intermittent stabbing upper abdominal pain and bloating. The pain sometimes radiates into her back. She also complains of heartburn and tells me that she took someone else's supply of Nexium and says it helped quite a bit. She complains of constipation but denies diarrhea, bloody stool, or melena. She also complains of increased appetite and some weight gain.  Outpatient Medications Prior to Visit  Medication Sig Dispense Refill  . cetirizine-pseudoephedrine (ZYRTEC-D ALLERGY & CONGESTION) 5-120 MG tablet Take 1 tablet by mouth 2 (two) times daily. 60 tablet 11  . Cyanocobalamin (NASCOBAL) 500 MCG/0.1ML SOLN Place 0.1 mLs (500 mcg total) into the nose once a week. 1.3 mL 11  . fluticasone (FLONASE) 50 MCG/ACT nasal spray Place 2 sprays into both nostrils daily. 16 g 11  . lubiprostone (AMITIZA) 24 MCG capsule Take 1 capsule (24 mcg total) by mouth 2 (two) times daily with a meal. 180 capsule 3  . terconazole (TERAZOL 3) 0.8 % vaginal cream Place 1 applicator vaginally at bedtime. 20 g 0   No facility-administered medications prior to visit.     ROS Review of Systems  Constitutional: Positive for appetite change and unexpected weight change. Negative for activity change, chills, diaphoresis and fever.  HENT: Negative.  Negative for sore throat, trouble swallowing and voice change.   Eyes: Negative.   Respiratory: Negative for cough, chest tightness, shortness of breath, wheezing and stridor.   Cardiovascular: Negative.  Negative for chest pain, palpitations and leg swelling.  Gastrointestinal: Positive for abdominal pain and constipation. Negative for abdominal distention, blood in stool, diarrhea, nausea, rectal pain and vomiting.  Endocrine: Negative.   Genitourinary: Negative.   Negative for decreased urine volume, difficulty urinating, dysuria, flank pain, frequency, hematuria, urgency and vaginal bleeding.  Musculoskeletal: Negative.   Skin: Negative.   Allergic/Immunologic: Negative.   Neurological: Negative.  Negative for dizziness, tremors, weakness, light-headedness and numbness.  Hematological: Negative.  Negative for adenopathy.  Psychiatric/Behavioral: Negative.     Objective:  BP 116/74 (BP Location: Left Arm, Patient Position: Sitting, Cuff Size: Normal)   Pulse 96   Temp 97.9 F (36.6 C) (Oral)   Resp 16   Ht 5\' 5"  (1.651 m)   Wt 200 lb (90.7 kg)   SpO2 98%   BMI 33.28 kg/m   BP Readings from Last 3 Encounters:  08/19/15 116/74  06/10/15 102/70  04/22/15 112/76    Wt Readings from Last 3 Encounters:  08/19/15 200 lb (90.7 kg)  06/10/15 200 lb (90.7 kg)  04/22/15 198 lb 8 oz (90 kg)    Physical Exam  Constitutional: She is oriented to person, place, and time.  Non-toxic appearance. She does not have a sickly appearance. She does not appear ill. No distress.  HENT:  Mouth/Throat: Oropharynx is clear and moist. No oropharyngeal exudate.  Eyes: Conjunctivae are normal. Right eye exhibits no discharge. Left eye exhibits no discharge. No scleral icterus.  Neck: Normal range of motion. Neck supple. No JVD present. No tracheal deviation present. No thyromegaly present.  Cardiovascular: Normal rate, regular rhythm, normal heart sounds and intact distal pulses.  Exam reveals no gallop and no friction rub.   No murmur heard. Pulmonary/Chest: Effort normal and breath sounds normal. No stridor. No respiratory distress. She has no wheezes. She has no  rales. She exhibits no tenderness.  Abdominal: Soft. Bowel sounds are normal. She exhibits no distension and no mass. There is no tenderness. There is no rebound and no guarding.  Musculoskeletal: Normal range of motion. She exhibits no edema or tenderness.  Lymphadenopathy:    She has no cervical  adenopathy.  Neurological: She is oriented to person, place, and time.  Skin: Skin is warm and dry. No rash noted. She is not diaphoretic. No erythema. No pallor.  Vitals reviewed.   Lab Results  Component Value Date   WBC 7.2 06/11/2015   HGB 13.2 06/11/2015   HCT 38.5 06/11/2015   PLT 384.0 06/11/2015   GLUCOSE 96 06/11/2015   CHOL 170 06/11/2015   TRIG 39.0 06/11/2015   HDL 55.90 06/11/2015   LDLCALC 107 (H) 06/11/2015   ALT 11 06/11/2015   AST 13 06/11/2015   NA 138 06/11/2015   K 4.5 06/11/2015   CL 106 06/11/2015   CREATININE 0.81 06/11/2015   BUN 13 06/11/2015   CO2 26 06/11/2015   TSH 1.31 06/11/2015   HGBA1C 5.5 04/14/2015    Rhonda Rhonda [name]  Result Date: 01/23/2012 *RADIOLOGY REPORT* Clinical Data: Rhonda [name]. DIGITAL BILATERAL Rhonda [name] MAMMOGRAM WITH CAD Comparison:  Previous exams. FINDINGS: ACR Breast Density Category 3: The breast tissue is heterogeneously dense. No suspicious masses, architectural distortion, or calcifications are present. Images were processed with CAD. IMPRESSION: No mammographic evidence of malignancy. A result letter of this Rhonda [name] mammogram will be mailed directly to the patient. RECOMMENDATION: Rhonda [name] mammogram in one year. (Code:SM-B-01Y) BI-RADS CATEGORY 1:  Negative. Original Report Authenticated By: Claudie Revering, M.D.    Assessment & Plan:   Rhonda Rhonda [name] was seen today for abdominal pain and gastroesophageal reflux.  Diagnoses and all orders for this visit:  Pain of upper abdomen- she has an increased appetite and weight gain so I am not concerned about any serious upper GI or gastric pathology, I will get an ultrasound to screen for gallstones. -     US Abdomen Complete; Future  Gastroesophageal reflux disease without esophagitis- will continue Nexium and have also offered Levbid for additional symptom relief. -     esomeprazole (NEXIUM) 40 MG capsule; Take 1 capsule (40 mg total) by mouth daily at 12 noon. -     hyoscyamine  (LEVBID) 0.375 MG 12 hr tablet; Take 1 tablet (0.375 mg total) by mouth 2 (two) times daily.  IBS (irritable bowel syndrome)- will try Levbid for symptom relief. -     hyoscyamine (LEVBID) 0.375 MG 12 hr tablet; Take 1 tablet (0.375 mg total) by mouth 2 (two) times daily.   I am having Rhonda Rhonda [name] start on esomeprazole and hyoscyamine. I am also having her maintain her terconazole, cetirizine-pseudoephedrine, fluticasone, lubiprostone, and Cyanocobalamin.  Meds ordered this encounter  Medications  . esomeprazole (NEXIUM) 40 MG capsule    Sig: Take 1 capsule (40 mg total) by mouth daily at 12 noon.    Dispense:  90 capsule    Refill:  3  . hyoscyamine (LEVBID) 0.375 MG 12 hr tablet    Sig: Take 1 tablet (0.375 mg total) by mouth 2 (two) times daily.    Dispense:  60 tablet    Refill:  3     Follow-up: Return in about 4 weeks (around 09/16/2015).  Scarlette Calico, MD

## 2015-08-19 NOTE — Progress Notes (Signed)
Pre visit review using our clinic review tool, if applicable. No additional management support is needed unless otherwise documented below in the visit note. 

## 2015-08-28 ENCOUNTER — Ambulatory Visit (HOSPITAL_COMMUNITY)
Admission: RE | Admit: 2015-08-28 | Discharge: 2015-08-28 | Disposition: A | Discharge status: Home / Self Care | Payer: 59 | Source: Ambulatory Visit | Attending: Internal Medicine | Admitting: Internal Medicine

## 2015-08-28 ENCOUNTER — Encounter: Payer: Self-pay | Admitting: Internal Medicine

## 2015-08-28 ENCOUNTER — Other Ambulatory Visit: Payer: Self-pay | Admitting: Internal Medicine

## 2015-08-28 DIAGNOSIS — K8020 Calculus of gallbladder without cholecystitis without obstruction: Secondary | ICD-10-CM | POA: Insufficient documentation

## 2015-08-28 DIAGNOSIS — R1010 Upper abdominal pain, unspecified: Secondary | ICD-10-CM | POA: Insufficient documentation

## 2015-08-28 DIAGNOSIS — K8000 Calculus of gallbladder with acute cholecystitis without obstruction: Secondary | ICD-10-CM | POA: Insufficient documentation

## 2015-09-07 ENCOUNTER — Other Ambulatory Visit: Payer: Self-pay | Admitting: Surgery

## 2015-09-14 ENCOUNTER — Ambulatory Visit (INDEPENDENT_AMBULATORY_CARE_PROVIDER_SITE_OTHER): Payer: 59

## 2015-09-14 DIAGNOSIS — E538 Deficiency of other specified B group vitamins: Secondary | ICD-10-CM | POA: Diagnosis not present

## 2015-09-14 MED ORDER — CYANOCOBALAMIN 1000 MCG/ML IJ SOLN
1000.0000 ug | Freq: Once | INTRAMUSCULAR | Status: AC
  Administered 2015-09-14: 1000 ug via INTRAMUSCULAR

## 2015-09-21 ENCOUNTER — Encounter (HOSPITAL_COMMUNITY): Payer: Self-pay

## 2015-09-21 NOTE — Patient Instructions (Signed)
RILEA YEARBY  09/21/2015   Your procedure is scheduled on: 09/25/2015    Report to [facility] Main  Entrance take [place]  elevators to 3rd floor to  [place] at  0730 AM.  Call this number if you have problems the morning of surgery [phone]   Remember: ONLY 1 PERSON MAY GO WITH YOU TO SHORT STAY TO GET  READY MORNING OF [place].  Do not eat food or drink liquids :After Midnight.     Take these medicines the morning of surgery with A SIP OF WATER: Nexium Flonase if needed                                 You may not have any metal on your body including hair pins and              piercings  Do not wear jewelry, make-up, lotions, powders or perfumes, deodorant             Do not wear nail polish.  Do not shave  48 hours prior to surgery.              Men may shave face and neck.   Do not bring valuables to the hospital. [place].  Contacts, dentures or bridgework may not be worn into surgery.       Patients discharged the day of surgery will not be allowed to drive home.  Name and phone number of your driver:  Special Instructions: coughing and deep breathing exercises, leg exercises               Please read over the following fact sheets you were given: _____________________________________________________________________             [facility] - Preparing for Surgery Before surgery, you can play an important role.  Because skin is not sterile, your skin needs to be as free of germs as possible.  You can reduce the number of germs on your skin by washing with CHG (chlorahexidine gluconate) soap before surgery.  CHG is an antiseptic cleaner which kills germs and bonds with the skin to continue killing germs even after washing. Please DO NOT use if you have an allergy to CHG or antibacterial soaps.  If your skin becomes reddened/irritated stop using the CHG and inform your nurse when you  arrive at Short Stay. Do not shave (including legs and underarms) for at least 48 hours prior to the first CHG shower.  You may shave your face/neck. Please follow these instructions carefully:  1.  Shower with CHG Soap the night before surgery and the  morning of Surgery.  2.  If you choose to wash your hair, wash your hair first as usual with your  normal  shampoo.  3.  After you shampoo, rinse your hair and body thoroughly to remove the  shampoo.                           4.  Use CHG as you would any other liquid soap.  You can apply chg directly  to the skin and wash  Gently with a scrungie or clean washcloth.  5.  Apply the CHG Soap to your body ONLY FROM THE NECK DOWN.   Do not use on face/ open                           Wound or open sores. Avoid contact with eyes, ears mouth and genitals (private parts).                       Wash face,  Genitals (private parts) with your normal soap.             6.  Wash thoroughly, paying special attention to the area where your surgery  will be performed.  7.  Thoroughly rinse your body with warm water from the neck down.  8.  DO NOT shower/wash with your normal soap after using and rinsing off  the CHG Soap.                9.  Pat yourself dry with a clean towel.            10.  Wear clean pajamas.            11.  Place clean sheets on your bed the night of your first shower and do not  sleep with pets. Day of Surgery : Do not apply any lotions/deodorants the morning of surgery.  Please wear clean clothes to the hospital/surgery center.  FAILURE TO FOLLOW THESE INSTRUCTIONS MAY RESULT IN THE CANCELLATION OF YOUR SURGERY PATIENT SIGNATURE_________________________________  NURSE SIGNATURE__________________________________  ________________________________________________________________________

## 2015-09-22 ENCOUNTER — Encounter (HOSPITAL_COMMUNITY)
Admission: RE | Admit: 2015-09-22 | Discharge: 2015-09-22 | Disposition: A | Discharge status: Home / Self Care | Payer: 59 | Source: Ambulatory Visit | Attending: Surgery | Admitting: Surgery

## 2015-09-22 ENCOUNTER — Encounter (HOSPITAL_COMMUNITY): Payer: Self-pay

## 2015-09-22 DIAGNOSIS — K829 Disease of gallbladder, unspecified: Secondary | ICD-10-CM | POA: Diagnosis present

## 2015-09-22 DIAGNOSIS — K219 Gastro-esophageal reflux disease without esophagitis: Secondary | ICD-10-CM | POA: Diagnosis not present

## 2015-09-22 DIAGNOSIS — Z7951 Long term (current) use of inhaled steroids: Secondary | ICD-10-CM | POA: Diagnosis not present

## 2015-09-22 DIAGNOSIS — Z79899 Other long term (current) drug therapy: Secondary | ICD-10-CM | POA: Diagnosis not present

## 2015-09-22 DIAGNOSIS — K8010 Calculus of gallbladder with chronic cholecystitis without obstruction: Secondary | ICD-10-CM | POA: Diagnosis not present

## 2015-09-22 LAB — CBC WITH DIFFERENTIAL/PLATELET
Basophils Absolute: 0 10*3/uL (ref 0.0–0.1)
Basophils Relative: 0 %
Eosinophils Absolute: 0 10*3/uL (ref 0.0–0.7)
Eosinophils Relative: 1 %
HEMATOCRIT: 40.7 % (ref 36.0–46.0)
HEMOGLOBIN: 13.5 g/dL (ref 12.0–15.0)
LYMPHS ABS: 2.2 10*3/uL (ref 0.7–4.0)
Lymphocytes Relative: 27 %
MCH: 29.4 pg (ref 26.0–34.0)
MCHC: 33.2 g/dL (ref 30.0–36.0)
MCV: 88.7 fL (ref 78.0–100.0)
MONOS PCT: 7 %
Monocytes Absolute: 0.6 10*3/uL (ref 0.1–1.0)
NEUTROS ABS: 5.2 10*3/uL (ref 1.7–7.7)
NEUTROS PCT: 65 %
Platelets: 434 10*3/uL — ABNORMAL HIGH (ref 150–400)
RBC: 4.59 MIL/uL (ref 3.87–5.11)
RDW: 13.2 % (ref 11.5–15.5)
WBC: 8 10*3/uL (ref 4.0–10.5)

## 2015-09-22 LAB — HCG, SERUM, QUALITATIVE: Preg, Serum: NEGATIVE

## 2015-09-22 LAB — COMPREHENSIVE METABOLIC PANEL
ALK PHOS: 100 U/L (ref 38–126)
ALT: 15 U/L (ref 14–54)
ANION GAP: 7 (ref 5–15)
AST: 18 U/L (ref 15–41)
Albumin: 4.1 g/dL (ref 3.5–5.0)
BILIRUBIN TOTAL: 1.4 mg/dL — AB (ref 0.3–1.2)
BUN: 9 mg/dL (ref 6–20)
CALCIUM: 9.5 mg/dL (ref 8.9–10.3)
CO2: 30 mmol/L (ref 22–32)
Chloride: 101 mmol/L (ref 101–111)
Creatinine, Ser: 0.88 mg/dL (ref 0.44–1.00)
Glucose, Bld: 102 mg/dL — ABNORMAL HIGH (ref 65–99)
Potassium: 4 mmol/L (ref 3.5–5.1)
Sodium: 138 mmol/L (ref 135–145)
TOTAL PROTEIN: 8.1 g/dL (ref 6.5–8.1)

## 2015-09-24 NOTE — H&P (Signed)
Rhonda [name]  Location: [facility] Surgery Patient #: [id number] DOB: 04/05/1970 Single / Language: Cleophus Molt / Race: Black or African American Female  History of Present Illness   Patient words: GB.   The patient is a 45 year old female who presents with a complaint of gall bladder disease.   The PCP is Dr. Alona Bene  The patient was referred by Dr. Alona Bene  She has had some stabbing pain in the abdomen after eating. About 2 to 3 weeks ago, on a Saturday night, after celebrating her brother's birthday, she awoke with pain between her shoulder blades. This pain last about 1 hour. She did not have nausea, fever, or change in bowel habits. She had an [place] on 08/28/2015 that showed multiple gall stones. She does have some alternation between constipation and diarrhea. She tried Amitiza for a while, but it just caused loose stools, so she had stopped taking it. She has some GERD. She is referred to talk about gall bladder surgery. She has no liver, or colon disease. She has not had a colonoscopy, nor has she had abdominal surgery. Note - her mother had gall bladder surgery in 2006 - by Margot Chimes, she had a bowel injury.  I discussed with the patient the indications and risks of gall bladder surgery. The primary risks of gall bladder surgery include, but are not limited to, bleeding, infection, common bile duct injury, and open surgery. There is also the risk that the patient may have continued symptoms after surgery. We discussed the typical post-operative recovery course. I tried to answer the patient's questions. I gave the patient literature about gall bladder surgery.  Plan: She wants to proceed with surgery.  Past Medical History: 1. IBS symptoms 2. GERD  Social History: Unmarried. Has no children. She lives by herself. She works as a Research scientist (physical sciences) at [employer].   Allergies (Ammie Eversole, LPN; 075-GRM  075-GRM PM) Amoxicillin *PENICILLINS* Doxycycline Hyclate *TETRACYCLINES*  Medication History (Ammie Eversole, LPN; 075-GRM 075-GRM PM) NexIUM (10MG  Packet, Oral) Active. Nascobal (500MCG/0.1ML Gel, Nasal) Active. Flonase (50MCG/ACT Suspension, Nasal) Active. Medications Reconciled  Vitals (Ammie Eversole LPN; 075-GRM [id number] PM) 09/07/2015 3:53 PM Weight: 199.2 lb Height: 65in Body Surface Area: 1.97 m Body Mass Index: 33.15 kg/m  Temp.: 98.49F(Oral)  BP: 130/70 (Sitting, Left Arm, Standard)   Physical Exam  General: WN AA F alert and generally healthy appearing. Skin: Inspection and palpation of the skin unremarkable.  Eyes: Conjunctivae white, pupils equal. Face, ears, nose, mouth, and throat: Face - normal. Normal ears and nose. Lips and teeth normal.  Neck: Supple. No mass. Trachea midline. No thyroid mass. Lymph Nodes: No supraclavicular or cervical adenopathy. No inguinal adenopathy.  Lungs: Normal respiratory effort. Clear to auscultation and symmetric breath sounds. Cardiovascular: Regular rate and rythm. Normal auscultation of the heart. No murmur or rub. Normal carotid pulse.  Abdomen: Soft. No mass. Liver and spleen not palpable. No tenderness. No hernia. Normal bowel sounds. No abdominal scars.  Musculoskeletal/extremities: Normal gait. Good strength and ROM in upper and lower extremities. Digits and nails are unremarkable.  Neurologic: Grossly intact to motor and sensory function. No obvious deficit in the cranial nerves. Psychiatric: Has normal mood and affect. Judgement and insight appear normal.  Assessment & Plan  1.  GALL BLADDER DISEASE (K82.9)  Plan:   1) Proceed with laparoscopic cholecystectomy with cholangiogram    Pt Education - Pamphlet Given - Laparoscopic Gallbladder Surgery: discussed with patient and provided information. 2. IBS  symptoms 3. GERD  Alphonsa Overall, MD, [facility] Surgery Pager:  [phone] Office phone:  [phone]

## 2015-09-25 ENCOUNTER — Encounter (HOSPITAL_COMMUNITY): Payer: Self-pay | Admitting: *Deleted

## 2015-09-25 ENCOUNTER — Ambulatory Visit (HOSPITAL_COMMUNITY): Payer: 59 | Admitting: Anesthesiology

## 2015-09-25 ENCOUNTER — Ambulatory Visit (HOSPITAL_COMMUNITY)
Admission: RE | Admit: 2015-09-25 | Discharge: 2015-09-25 | Disposition: A | Discharge status: Home / Self Care | Payer: 59 | Source: Ambulatory Visit | Attending: Surgery | Admitting: Surgery

## 2015-09-25 ENCOUNTER — Ambulatory Visit (HOSPITAL_COMMUNITY): Payer: 59

## 2015-09-25 ENCOUNTER — Encounter (HOSPITAL_COMMUNITY)
Admission: RE | Disposition: A | Discharge status: Home / Self Care | Payer: Self-pay | Source: Ambulatory Visit | Attending: Surgery

## 2015-09-25 DIAGNOSIS — K219 Gastro-esophageal reflux disease without esophagitis: Secondary | ICD-10-CM | POA: Insufficient documentation

## 2015-09-25 DIAGNOSIS — K8010 Calculus of gallbladder with chronic cholecystitis without obstruction: Secondary | ICD-10-CM | POA: Diagnosis not present

## 2015-09-25 DIAGNOSIS — Z79899 Other long term (current) drug therapy: Secondary | ICD-10-CM | POA: Insufficient documentation

## 2015-09-25 DIAGNOSIS — Z419 Encounter for procedure for purposes other than remedying health state, unspecified: Secondary | ICD-10-CM

## 2015-09-25 DIAGNOSIS — Z7951 Long term (current) use of inhaled steroids: Secondary | ICD-10-CM | POA: Insufficient documentation

## 2015-09-25 HISTORY — PX: CHOLECYSTECTOMY: SHX55

## 2015-09-25 SURGERY — LAPAROSCOPIC CHOLECYSTECTOMY WITH INTRAOPERATIVE CHOLANGIOGRAM
Anesthesia: General

## 2015-09-25 MED ORDER — BUPIVACAINE-EPINEPHRINE 0.25% -1:200000 IJ SOLN
INTRAMUSCULAR | Status: DC | PRN
  Administered 2015-09-25: 30 mL

## 2015-09-25 MED ORDER — ONDANSETRON HCL 4 MG/2ML IJ SOLN
INTRAMUSCULAR | Status: DC | PRN
  Administered 2015-09-25: 4 mg via INTRAVENOUS

## 2015-09-25 MED ORDER — IOPAMIDOL (ISOVUE-300) INJECTION 61%
INTRAVENOUS | Status: AC
  Filled 2015-09-25: qty 50

## 2015-09-25 MED ORDER — SUGAMMADEX SODIUM 200 MG/2ML IV SOLN
INTRAVENOUS | Status: DC | PRN
  Administered 2015-09-25: 200 mg via INTRAVENOUS

## 2015-09-25 MED ORDER — LACTATED RINGERS IV SOLN
INTRAVENOUS | Status: DC | PRN
Start: 2015-09-25 — End: 2015-09-25
  Administered 2015-09-25: 1000 mL

## 2015-09-25 MED ORDER — SUGAMMADEX SODIUM 200 MG/2ML IV SOLN
INTRAVENOUS | Status: AC
  Filled 2015-09-25: qty 2

## 2015-09-25 MED ORDER — HYDROMORPHONE HCL 1 MG/ML IJ SOLN
0.2500 mg | INTRAMUSCULAR | Status: DC | PRN
  Administered 2015-09-25: 0.25 mg via INTRAVENOUS
  Administered 2015-09-25 (×2): 0.5 mg via INTRAVENOUS
  Administered 2015-09-25: 0.25 mg via INTRAVENOUS

## 2015-09-25 MED ORDER — HYDROMORPHONE HCL 1 MG/ML IJ SOLN
INTRAMUSCULAR | Status: AC
  Filled 2015-09-25: qty 1

## 2015-09-25 MED ORDER — PROMETHAZINE HCL 25 MG/ML IJ SOLN
6.2500 mg | Freq: Four times a day (QID) | INTRAMUSCULAR | Status: DC | PRN
  Administered 2015-09-25: 6.25 mg via INTRAVENOUS
  Filled 2015-09-25: qty 1

## 2015-09-25 MED ORDER — PROPOFOL 10 MG/ML IV BOLUS
INTRAVENOUS | Status: AC
Start: 2015-09-25 — End: 2015-09-25
  Filled 2015-09-25: qty 20

## 2015-09-25 MED ORDER — FENTANYL CITRATE (PF) 250 MCG/5ML IJ SOLN
INTRAMUSCULAR | Status: AC
  Filled 2015-09-25: qty 5

## 2015-09-25 MED ORDER — 0.9 % SODIUM CHLORIDE (POUR BTL) OPTIME
TOPICAL | Status: DC | PRN
  Administered 2015-09-25: 1000 mL

## 2015-09-25 MED ORDER — MIDAZOLAM HCL 5 MG/5ML IJ SOLN
INTRAMUSCULAR | Status: DC | PRN
  Administered 2015-09-25: 2 mg via INTRAVENOUS

## 2015-09-25 MED ORDER — ROCURONIUM BROMIDE 100 MG/10ML IV SOLN
INTRAVENOUS | Status: DC | PRN
  Administered 2015-09-25: 40 mg via INTRAVENOUS

## 2015-09-25 MED ORDER — KETOROLAC TROMETHAMINE 30 MG/ML IJ SOLN
30.0000 mg | Freq: Once | INTRAMUSCULAR | Status: AC
  Administered 2015-09-25: 30 mg via INTRAVENOUS
  Filled 2015-09-25: qty 1

## 2015-09-25 MED ORDER — MIDAZOLAM HCL 2 MG/2ML IJ SOLN
INTRAMUSCULAR | Status: AC
  Filled 2015-09-25: qty 2

## 2015-09-25 MED ORDER — CEFAZOLIN SODIUM-DEXTROSE 2-4 GM/100ML-% IV SOLN
2.0000 g | INTRAVENOUS | Status: DC
  Filled 2015-09-25: qty 100

## 2015-09-25 MED ORDER — DEXAMETHASONE SODIUM PHOSPHATE 10 MG/ML IJ SOLN
INTRAMUSCULAR | Status: DC | PRN
  Administered 2015-09-25: 10 mg via INTRAVENOUS

## 2015-09-25 MED ORDER — GABAPENTIN 300 MG PO CAPS
300.0000 mg | ORAL_CAPSULE | ORAL | Status: AC
  Administered 2015-09-25: 300 mg via ORAL
  Filled 2015-09-25: qty 1

## 2015-09-25 MED ORDER — BUPIVACAINE-EPINEPHRINE 0.25% -1:200000 IJ SOLN
INTRAMUSCULAR | Status: AC
  Filled 2015-09-25: qty 1

## 2015-09-25 MED ORDER — PROMETHAZINE HCL 25 MG/ML IJ SOLN: 6.2500 mg | INTRAMUSCULAR | Status: DC | PRN

## 2015-09-25 MED ORDER — HYDROCODONE-ACETAMINOPHEN 5-325 MG PO TABS: 1.0000 | ORAL_TABLET | Freq: Four times a day (QID) | ORAL | 0 refills | Status: DC | PRN

## 2015-09-25 MED ORDER — LACTATED RINGERS IV SOLN
INTRAVENOUS | Status: DC
  Administered 2015-09-25: 09:00:00 via INTRAVENOUS

## 2015-09-25 MED ORDER — ACETAMINOPHEN 500 MG PO TABS
1000.0000 mg | ORAL_TABLET | ORAL | Status: AC
  Administered 2015-09-25: 1000 mg via ORAL
  Filled 2015-09-25: qty 2

## 2015-09-25 MED ORDER — CEFAZOLIN SODIUM-DEXTROSE 2-4 GM/100ML-% IV SOLN
INTRAVENOUS | Status: AC
  Filled 2015-09-25: qty 100

## 2015-09-25 MED ORDER — MEPERIDINE HCL 50 MG/ML IJ SOLN: 6.2500 mg | INTRAMUSCULAR | Status: DC | PRN

## 2015-09-25 MED ORDER — ONDANSETRON HCL 4 MG/2ML IJ SOLN
INTRAMUSCULAR | Status: AC
  Filled 2015-09-25: qty 2

## 2015-09-25 MED ORDER — PROPOFOL 10 MG/ML IV BOLUS
INTRAVENOUS | Status: DC | PRN
  Administered 2015-09-25: 100 mg via INTRAVENOUS

## 2015-09-25 MED ORDER — FENTANYL CITRATE (PF) 250 MCG/5ML IJ SOLN
INTRAMUSCULAR | Status: DC | PRN
  Administered 2015-09-25: 50 ug via INTRAVENOUS
  Administered 2015-09-25: 100 ug via INTRAVENOUS

## 2015-09-25 MED ORDER — HYDROMORPHONE HCL 1 MG/ML IJ SOLN
INTRAMUSCULAR | Status: DC
Start: 2015-09-25 — End: 2015-09-25
  Filled 2015-09-25: qty 1

## 2015-09-25 MED ORDER — LIDOCAINE 2% (20 MG/ML) 5 ML SYRINGE
INTRAMUSCULAR | Status: AC
  Filled 2015-09-25: qty 5

## 2015-09-25 MED ORDER — DEXAMETHASONE SODIUM PHOSPHATE 10 MG/ML IJ SOLN
INTRAMUSCULAR | Status: AC
  Filled 2015-09-25: qty 1

## 2015-09-25 MED ORDER — HYDROCODONE-ACETAMINOPHEN 5-325 MG PO TABS: 1.0000 | ORAL_TABLET | ORAL | Status: DC | PRN

## 2015-09-25 SURGICAL SUPPLY — 41 items
APL SKNCLS STERI-STRIP NONHPOA (GAUZE/BANDAGES/DRESSINGS)
APPLIER CLIP 5 13 M/L LIGAMAX5 (MISCELLANEOUS)
APPLIER CLIP ROT 10 11.4 M/L (STAPLE) ×2
APR CLP MED LRG 11.4X10 (STAPLE) ×1
APR CLP MED LRG 5 ANG JAW (MISCELLANEOUS)
BAG SPEC RTRVL LRG 6X4 10 (ENDOMECHANICALS) ×1
BENZOIN TINCTURE PRP APPL 2/3 (GAUZE/BANDAGES/DRESSINGS) IMPLANT
CABLE HIGH FREQUENCY MONO STRZ (ELECTRODE) ×2 IMPLANT
CHLORAPREP W/TINT 26ML (MISCELLANEOUS) ×2 IMPLANT
CHOLANGIOGRAM CATH TAUT (CATHETERS) ×2 IMPLANT
CLIP APPLIE 5 13 M/L LIGAMAX5 (MISCELLANEOUS) IMPLANT
CLIP APPLIE ROT 10 11.4 M/L (STAPLE) IMPLANT
COVER MAYO STAND STRL (DRAPES) ×2 IMPLANT
COVER SURGICAL LIGHT HANDLE (MISCELLANEOUS) ×2 IMPLANT
DECANTER SPIKE VIAL GLASS SM (MISCELLANEOUS) ×2 IMPLANT
DRAPE C-ARM 42X120 X-RAY (DRAPES) ×2 IMPLANT
ELECT REM PT RETURN 9FT ADLT (ELECTROSURGICAL) ×2
ELECTRODE REM PT RTRN 9FT ADLT (ELECTROSURGICAL) ×1 IMPLANT
GLOVE SURG SIGNA 7.5 PF LTX (GLOVE) ×2 IMPLANT
GOWN STRL REUS W/TWL XL LVL3 (GOWN DISPOSABLE) ×6 IMPLANT
HEMOSTAT SURGICEL 4X8 (HEMOSTASIS) IMPLANT
IRRIG SUCT STRYKERFLOW 2 WTIP (MISCELLANEOUS) ×2
IRRIGATION SUCT STRKRFLW 2 WTP (MISCELLANEOUS) ×1 IMPLANT
IV CATH 14GX2 1/4 (CATHETERS) ×2 IMPLANT
IV SET EXTENSION CATH 6 NF (IV SETS) ×2 IMPLANT
KIT BASIN OR (CUSTOM PROCEDURE TRAY) ×2 IMPLANT
LIQUID BAND (GAUZE/BANDAGES/DRESSINGS) ×1 IMPLANT
POUCH SPECIMEN RETRIEVAL 10MM (ENDOMECHANICALS) ×2 IMPLANT
SCISSORS LAP 5X35 DISP (ENDOMECHANICALS) ×2 IMPLANT
SLEEVE ADV FIXATION 5X100MM (TROCAR) ×2 IMPLANT
STOPCOCK 4 WAY LG BORE MALE ST (IV SETS) ×2 IMPLANT
STRIP CLOSURE SKIN 1/4X4 (GAUZE/BANDAGES/DRESSINGS) IMPLANT
SUT MNCRL AB 4-0 PS2 18 (SUTURE) ×2 IMPLANT
SYR 10ML ECCENTRIC (SYRINGE) ×2 IMPLANT
TOWEL OR 17X26 10 PK STRL BLUE (TOWEL DISPOSABLE) ×2 IMPLANT
TOWEL OR NON WOVEN STRL DISP B (DISPOSABLE) ×2 IMPLANT
TRAY LAPAROSCOPIC (CUSTOM PROCEDURE TRAY) ×2 IMPLANT
TROCAR ADV FIXATION 11X100MM (TROCAR) ×1 IMPLANT
TROCAR ADV FIXATION 5X100MM (TROCAR) ×2 IMPLANT
TROCAR XCEL BLUNT TIP 100MML (ENDOMECHANICALS) ×2 IMPLANT
TUBING INSUF HEATED (TUBING) ×2 IMPLANT

## 2015-09-25 NOTE — Anesthesia Procedure Notes (Signed)
Procedure Name: Intubation Date/Time: 09/25/2015 9:30 AM Performed by: Dione Booze Pre-anesthesia Checklist: Emergency Drugs available, Suction available, Patient being monitored and Patient identified Patient Re-evaluated:Patient Re-evaluated prior to inductionOxygen Delivery Method: Circle system utilized Preoxygenation: Pre-oxygenation with 100% oxygen Intubation Type: IV induction Ventilation: Mask ventilation without difficulty Laryngoscope Size: Mac and 4 Grade View: Grade II Tube type: Oral Tube size: 7.5 mm Airway Equipment and Method: Stylet Placement Confirmation: ETT inserted through vocal cords under direct vision,  positive ETCO2 and breath sounds checked- equal and bilateral Secured at: 21 cm Tube secured with: Tape Dental Injury: Teeth and Oropharynx as per pre-operative assessment

## 2015-09-25 NOTE — Progress Notes (Signed)
Patient w dry heaves and nauseated

## 2015-09-25 NOTE — Discharge Instructions (Signed)
[facility] - DISCHARGE INSTRUCTIONS TO PATIENT  Activity:  Driving - May drive in 2 to 4 days, if doing well   Lifting - No lifting more than 15 pounds for 7 days, then no limit  Wound Care:   Keep wounds dry for 2 days, then may shower  Diet:  As tolerated.  Drink plenty of fluid.  Follow up appointment:  Call Dr. Pollie Friar office [facility]) at [phone] for an appointment in 2 to 3 weeks.  Medications and dosages:  Resume your home medications.  You have a prescription for:  Vicodin  Call Dr. Lucia Gaskins or his office  [phone]) if you have:  Temperature greater than 100.4,  Persistent nausea and vomiting,  Severe uncontrolled pain,  Redness, tenderness, or signs of infection (pain, swelling, redness, odor or green/yellow discharge around the site),  Difficulty breathing, headache or visual disturbances,  Any other questions or concerns you may have after discharge.  In an emergency, call 911 or go to an Emergency Department at a nearby hospital.   General Anesthesia, Adult, Care After Refer to this sheet in the next few weeks. These instructions provide you with information on caring for yourself after your procedure. Your health care provider may also give you more specific instructions. Your treatment has been planned according to current medical practices, but problems sometimes occur. Call your health care provider if you have any problems or questions after your procedure. WHAT TO EXPECT AFTER THE PROCEDURE After the procedure, it is typical to experience:  Sleepiness.  Nausea and vomiting. HOME CARE INSTRUCTIONS  For the first 24 hours after general anesthesia:  Have a responsible person with you.  Do not drive a car. If you are alone, do not take public transportation.  Do not drink alcohol.  Do not take medicine that has not been prescribed by your health care provider.  Do not sign important papers or make important  decisions.  You may resume a normal diet and activities as directed by your health care provider.  If you have questions or problems that seem related to general anesthesia, call the hospital and ask for the anesthetist or anesthesiologist on call. SEEK MEDICAL CARE IF:  You have nausea and vomiting that continue the day after anesthesia.  You develop a rash. SEEK IMMEDIATE MEDICAL CARE IF:   You have difficulty breathing.  You have chest pain.  You have any allergic problems.   This information is not intended to replace advice given to you by your health care provider. Make sure you discuss any questions you have with your health care provider.   Document Released: 04/18/2000 Document Revised: 01/31/2014 Document Reviewed: 05/11/2011 Elsevier Interactive Patient Education [employer].

## 2015-09-25 NOTE — Transfer of Care (Signed)
Immediate Anesthesia Transfer of Care Note  Patient: Rhonda [name]  Procedure(s) Performed: Procedure(s): LAPAROSCOPIC CHOLECYSTECTOMY WITH INTRAOPERATIVE CHOLANGIOGRAM (N/A)  Patient Location: PACU  Anesthesia Type:General  Level of Consciousness: sedated and responds to stimulation  Airway & Oxygen Therapy: Patient Spontanous Breathing and Patient connected to face mask oxygen  Post-op Assessment: Report given to RN and Post -op Vital signs reviewed and stable  Post vital signs: Reviewed and stable  Last Vitals:  Vitals:   09/25/15 0730  BP: 137/76  Pulse: 89  Resp: 16  Temp: 36.7 C    Last Pain:  Vitals:   09/25/15 0730  TempSrc: Oral         Complications: No apparent anesthesia complications

## 2015-09-25 NOTE — Anesthesia Preprocedure Evaluation (Signed)
Anesthesia Evaluation  Patient identified by MRN, date of birth, ID band Patient awake    Reviewed: Allergy & Precautions, NPO status , Patient's Chart, lab work & pertinent test results  Airway Mallampati: II  TM Distance: >3 FB Neck ROM: Full    Dental no notable dental hx.    Pulmonary neg pulmonary ROS,    Pulmonary exam normal breath sounds clear to auscultation       Cardiovascular Normal cardiovascular exam Rhythm:Regular Rate:Normal     Neuro/Psych negative neurological ROS  negative psych ROS   GI/Hepatic negative GI ROS, Neg liver ROS, GERD  ,  Endo/Other  negative endocrine ROS  Renal/GU negative Renal ROS     Musculoskeletal negative musculoskeletal ROS (+)   Abdominal   Peds  Hematology negative hematology ROS (+)   Anesthesia Other Findings   Reproductive/Obstetrics negative OB ROS                             Anesthesia Physical Anesthesia Plan  ASA: I  Anesthesia Plan: General   Post-op Pain Management:    Induction: Intravenous  Airway Management Planned: Oral ETT  Additional Equipment:   Intra-op Plan:   Post-operative Plan: Extubation in OR  Informed Consent: I have reviewed the patients History and Physical, chart, labs and discussed the procedure including the risks, benefits and alternatives for the proposed anesthesia with the patient or authorized representative who has indicated his/her understanding and acceptance.   Dental advisory given  Plan Discussed with: CRNA  Anesthesia Plan Comments:         Anesthesia Quick Evaluation

## 2015-09-25 NOTE — Progress Notes (Signed)
Patient still complaining of pain of a 7, but is still sedated. States she is seeing double and only awakes when spoken too. Call to Dr Lissa Hoard for toradol order

## 2015-09-25 NOTE — Progress Notes (Addendum)
Patient is very sleepy after pain and nausea medications. She will open eyes and answer questions. Family is concerned that patient is too sleepy to go home today. RN explains she will continue observations and transition patient to a recliner chair when she is more awake.  1530  Patient is more awake. Up to recliner chair with two person assist and gait belt. Tolerated well. [place] PO. Ice to abdomen. Call bell within reach.  1615  Up to bathroom with two person assist and gait belt. Able to void. Ambulated from 1301 to 1310 and tolerated well.

## 2015-09-25 NOTE — Anesthesia Postprocedure Evaluation (Signed)
Anesthesia Post Note  Patient: Rhonda [name]  Procedure(s) Performed: Procedure(s) (LRB): LAPAROSCOPIC CHOLECYSTECTOMY WITH INTRAOPERATIVE CHOLANGIOGRAM (N/A)  Patient location during evaluation: PACU Anesthesia Type: General Level of consciousness: sedated and patient cooperative Pain management: pain level controlled Vital Signs Assessment: post-procedure vital signs reviewed and stable Respiratory status: spontaneous breathing Cardiovascular status: stable Anesthetic complications: no    Last Vitals:  Vitals:   09/25/15 1211 09/25/15 1629  BP: 119/64 127/63  Pulse: 81 89  Resp: 14 16  Temp: 36.8 C 36.3 C    Last Pain:  Vitals:   09/25/15 1629  TempSrc: Oral  PainSc: [place]

## 2015-09-25 NOTE — Op Note (Signed)
09/25/2015  11:10 AM  PATIENT:  Rhonda [name], 45 y.o., female, MRN: [id number]  PREOP DIAGNOSIS:  symptomatic gall stones  POSTOP DIAGNOSIS:   Chronic cholecystitis, cholelithiasis  PROCEDURE:   Procedure(s): LAPAROSCOPIC CHOLECYSTECTOMY WITH INTRAOPERATIVE CHOLANGIOGRAM  SURGEON:   Alphonsa Overall, M.D.  Terrence DupontBrien Mates, M.D.  ANESTHESIA:   general  Anesthesiologist: Nolon Nations, MD CRNA: Dione Booze, CRNA; Lollie Sails, CRNA  General  ASA: 2  EBL:  minimal  ml  BLOOD ADMINISTERED: none  DRAINS: none   LOCAL MEDICATIONS USED:   30 cc 1/4% marcaine  SPECIMEN:   Gall bladder  COUNTS CORRECT:  YES  INDICATIONS FOR PROCEDURE:  Rhonda [name] is a 45 y.o. (DOB: 05-18-1970) AA female whose primary care physician is Scarlette Calico, MD and comes for cholecystectomy.   The indications and risks of the gall bladder surgery were explained to the patient.  The risks include, but are not limited to, infection, bleeding, common bile duct injury and open surgery.  SURGERY:  The patient was taken to OR room #4 at [facility].  The abdomen was prepped with chloroprep.  The patient was not given antibiotics pre op.   A time out was held and the surgical checklist run.   An infraumbilical incision was made into the abdominal cavity.  A 12 mm Hasson trocar was inserted into the abdominal cavity through the infraumbilical incision and secured with a 0 Vicryl suture.  Three additional trocars were inserted: a 10 mm trocar in the sub-xiphoid location, a 5 mm trocar in the right mid subcostal area, and a 5 mm trocar in the right lateral subcostal area.   The abdomen was explored and the liver, stomach, and bowel that could be seen were unremarkable.   The gall bladder was encased in fat, consistent with mild chronic cholecystitis.   I grasped the gall bladder and rotated it cephalad.  Disssection was carried down to the gall bladder/cystic duct junction and the  cystic duct isolated.  A clip was placed on the gall bladder side of the cystic duct.   An intra-operative cholangiogram was shot.   The intra-operative cholangiogram was shot using a cut off Taut catheter placed through a 14 gauge angiocath in the RUQ.  The Taut catheter was inserted in the cut cystic duct and secured with an endoclip.  A cholangiogram was shot with 10 cc of 1/2 strength Isoview.  Using fluoroscopy, the cholangiogram showed the flow of contrast into the common bile duct, up the hepatic radicals, and into the duodenum.  There was no mass or obstruction.  This was a normal intra-operative cholangiogram.   The Taut catheter was removed.  The cystic duct was tripley endoclipped and the cystic artery was identified and clipped.  The gall bladder was bluntly and sharpley dissected from the gall bladder bed.   After the gall bladder was removed from the liver, the gall bladder bed and [place] were inspected.  There was no bleeding or bile leak.  The gall bladder was placed in a endocatch bag and delivered through the umbilicus.  The abdomen was irrigated with 2,000 cc saline.   The trocars were then removed.  I infiltrated 30cc of 1/4% Marcaine into the incisions.  The umbilical port closed with a 0 Vicryl suture and the skin closed with 4-0 Monocryl.  The skin was painted with LiquidBand.  The patient's sponge and needle count were correct.  The patient was transported to the  RR in good condition.  Alphonsa Overall, MD, [facility] Pager: [phone] Office phone:  [phone]

## 2015-09-25 NOTE — Interval H&P Note (Signed)
History and Physical Interval Note:  09/25/2015 9:07 AM  Rhonda [name]  has presented today for surgery, with the diagnosis of symptomatic gall stones  The various methods of treatment have been discussed with the patient and family.  Her brother and mother are here with her.  After consideration of risks, benefits and other options for treatment, the patient has consented to  Procedure(s): LAPAROSCOPIC CHOLECYSTECTOMY WITH INTRAOPERATIVE CHOLANGIOGRAM (N/A) as a surgical intervention .  The patient's history has been reviewed, patient examined, no change in status, stable for surgery.  I have reviewed the patient's chart and labs.  Questions were answered to the patient's satisfaction.     Taysen Bushart H

## 2015-10-15 ENCOUNTER — Ambulatory Visit (INDEPENDENT_AMBULATORY_CARE_PROVIDER_SITE_OTHER): Payer: 59 | Admitting: *Deleted

## 2015-10-15 ENCOUNTER — Ambulatory Visit: Payer: 59

## 2015-10-15 DIAGNOSIS — E538 Deficiency of other specified B group vitamins: Secondary | ICD-10-CM

## 2015-10-15 MED ORDER — CYANOCOBALAMIN 1000 MCG/ML IJ SOLN
1000.0000 ug | Freq: Once | INTRAMUSCULAR | Status: AC
  Administered 2015-10-15: 1000 ug via INTRAMUSCULAR

## 2015-10-15 NOTE — Progress Notes (Signed)
Medical treatment/procedure(s) were performed by non-physician practitioner and as supervising physician I was immediately available for consultation/collaboration. I agree with above. Kaileigh Viswanathan A Daimian Sudberry, MD  

## 2015-11-13 ENCOUNTER — Ambulatory Visit (INDEPENDENT_AMBULATORY_CARE_PROVIDER_SITE_OTHER): Payer: 59 | Admitting: Geriatric Medicine

## 2015-11-13 DIAGNOSIS — E538 Deficiency of other specified B group vitamins: Secondary | ICD-10-CM | POA: Diagnosis not present

## 2015-11-13 MED ORDER — CYANOCOBALAMIN 1000 MCG/ML IJ SOLN
1000.0000 ug | Freq: Once | INTRAMUSCULAR | Status: AC
  Administered 2015-11-13: 1000 ug via INTRAMUSCULAR

## 2015-12-14 ENCOUNTER — Ambulatory Visit (INDEPENDENT_AMBULATORY_CARE_PROVIDER_SITE_OTHER): Payer: 59

## 2015-12-14 DIAGNOSIS — E538 Deficiency of other specified B group vitamins: Secondary | ICD-10-CM

## 2015-12-14 MED ORDER — CYANOCOBALAMIN 1000 MCG/ML IJ SOLN
1000.0000 ug | Freq: Once | INTRAMUSCULAR | Status: AC
  Administered 2015-12-14: 1000 ug via INTRAMUSCULAR

## 2016-01-13 ENCOUNTER — Ambulatory Visit (INDEPENDENT_AMBULATORY_CARE_PROVIDER_SITE_OTHER): Payer: 59

## 2016-01-13 DIAGNOSIS — E538 Deficiency of other specified B group vitamins: Secondary | ICD-10-CM

## 2016-01-13 MED ORDER — CYANOCOBALAMIN 1000 MCG/ML IJ SOLN
1000.0000 ug | Freq: Once | INTRAMUSCULAR | Status: AC
  Administered 2016-01-13: 1000 ug via INTRAMUSCULAR

## 2016-02-15 ENCOUNTER — Ambulatory Visit (INDEPENDENT_AMBULATORY_CARE_PROVIDER_SITE_OTHER): Payer: 59

## 2016-02-15 DIAGNOSIS — E538 Deficiency of other specified B group vitamins: Secondary | ICD-10-CM

## 2016-02-15 MED ORDER — CYANOCOBALAMIN 1000 MCG/ML IJ SOLN
1000.0000 ug | Freq: Once | INTRAMUSCULAR | Status: AC
  Administered 2016-02-15: 1000 ug via INTRAMUSCULAR

## 2016-02-24 ENCOUNTER — Encounter: Payer: Self-pay | Admitting: Internal Medicine

## 2016-02-24 ENCOUNTER — Ambulatory Visit (INDEPENDENT_AMBULATORY_CARE_PROVIDER_SITE_OTHER): Payer: 59 | Admitting: Internal Medicine

## 2016-02-24 ENCOUNTER — Other Ambulatory Visit (INDEPENDENT_AMBULATORY_CARE_PROVIDER_SITE_OTHER): Payer: 59

## 2016-02-24 VITALS — BP 110/70 | HR 87 | Temp 98.2°F | Resp 16 | Ht 65.0 in | Wt 203.5 lb

## 2016-02-24 DIAGNOSIS — H539 Unspecified visual disturbance: Secondary | ICD-10-CM

## 2016-02-24 DIAGNOSIS — R739 Hyperglycemia, unspecified: Secondary | ICD-10-CM

## 2016-02-24 DIAGNOSIS — R51 Headache: Secondary | ICD-10-CM

## 2016-02-24 DIAGNOSIS — R519 Headache, unspecified: Secondary | ICD-10-CM

## 2016-02-24 DIAGNOSIS — J0100 Acute maxillary sinusitis, unspecified: Secondary | ICD-10-CM

## 2016-02-24 DIAGNOSIS — E538 Deficiency of other specified B group vitamins: Secondary | ICD-10-CM | POA: Diagnosis not present

## 2016-02-24 LAB — VITAMIN B12: Vitamin B-12: 784 pg/mL (ref 211–911)

## 2016-02-24 LAB — CBC WITH DIFFERENTIAL/PLATELET
BASOS PCT: 1.1 % (ref 0.0–3.0)
Basophils Absolute: 0.1 10*3/uL (ref 0.0–0.1)
EOS PCT: 1.3 % (ref 0.0–5.0)
Eosinophils Absolute: 0.1 10*3/uL (ref 0.0–0.7)
HEMATOCRIT: 39.3 % (ref 36.0–46.0)
HEMOGLOBIN: 13.4 g/dL (ref 12.0–15.0)
LYMPHS PCT: 33.2 % (ref 12.0–46.0)
Lymphs Abs: 2.6 10*3/uL (ref 0.7–4.0)
MCHC: 34.1 g/dL (ref 30.0–36.0)
MCV: 89 fl (ref 78.0–100.0)
MONOS PCT: 8.7 % (ref 3.0–12.0)
Monocytes Absolute: 0.7 10*3/uL (ref 0.1–1.0)
NEUTROS ABS: 4.4 10*3/uL (ref 1.4–7.7)
Neutrophils Relative %: 55.7 % (ref 43.0–77.0)
PLATELETS: 428 10*3/uL — AB (ref 150.0–400.0)
RBC: 4.41 Mil/uL (ref 3.87–5.11)
RDW: 13.8 % (ref 11.5–15.5)
WBC: 7.9 10*3/uL (ref 4.0–10.5)

## 2016-02-24 LAB — BASIC METABOLIC PANEL
BUN: 7 mg/dL (ref 6–23)
CALCIUM: 9.3 mg/dL (ref 8.4–10.5)
CO2: 30 meq/L (ref 19–32)
CREATININE: 0.8 mg/dL (ref 0.40–1.20)
Chloride: 103 mEq/L (ref 96–112)
GFR: 99.12 mL/min (ref >60.00)
GLUCOSE: 89 mg/dL (ref 70–99)
Potassium: 3.8 mEq/L (ref 3.5–5.1)
Sodium: 138 mEq/L (ref 135–145)

## 2016-02-24 LAB — HEMOGLOBIN A1C: Hgb A1c MFr Bld: 5.5 % (ref 4.6–6.5)

## 2016-02-24 MED ORDER — CEFDINIR 300 MG PO CAPS: 300.0000 mg | ORAL_CAPSULE | Freq: Two times a day (BID) | ORAL | 0 refills | Status: AC

## 2016-02-24 NOTE — Progress Notes (Signed)
Subjective:  Patient ID: Rhonda [name], female    DOB: 1970/11/13  Age: 46 y.o. MRN: [id number]  CC: Sinusitis and Headache   HPI Rhonda [name] presents for concerns about chronic headaches and a sinus infection.   For about 1 week she complains of facial pain and thick yellow nasal phlegm. She denies fever, chills, sore throat, or earaches.  She also complains of a one year history of stabbing discomfort in both temples and around both eyes. He complains of intermittently blurred vision. She saw an eye doctor and was told that she has declining vision. The headaches are intermittent and not associated, vomiting, numbness, weakness, or tingling.  Outpatient Medications Prior to Visit  Medication Sig Dispense Refill  . esomeprazole (NEXIUM) 40 MG capsule Take 1 capsule (40 mg total) by mouth daily at 12 noon. (Patient taking differently: Take 40 mg by mouth daily as needed (indigestion). ) 90 capsule 3  . ibuprofen (ADVIL,MOTRIN) 200 MG tablet Take 400 mg by mouth every 6 (six) hours as needed for headache or moderate pain.    . cetirizine-pseudoephedrine (ZYRTEC-D ALLERGY & CONGESTION) 5-120 MG tablet Take 1 tablet by mouth 2 (two) times daily. (Patient not taking: Reported on 02/24/2016) 60 tablet 11  . fluticasone (FLONASE) 50 MCG/ACT nasal spray Place 2 sprays into both nostrils daily. (Patient not taking: Reported on 02/24/2016) 16 g 11  . HYDROcodone-acetaminophen (NORCO/VICODIN) 5-325 MG tablet Take 1-2 tablets by mouth every 6 (six) hours as needed for moderate pain. 30 tablet 0   No facility-administered medications prior to visit.     ROS Review of Systems  Constitutional: Negative for appetite change, chills, diaphoresis, fatigue and fever.  HENT: Positive for congestion, postnasal drip, rhinorrhea and sinus pain. Negative for facial swelling, sore throat, trouble swallowing and voice change.   Eyes: Positive for pain and visual disturbance. Negative for photophobia and  redness.  Respiratory: Negative for cough, chest tightness and shortness of breath.   Cardiovascular: Negative.  Negative for chest pain, palpitations and leg swelling.  Gastrointestinal: Negative for abdominal pain, constipation, diarrhea, nausea and vomiting.  Endocrine: Negative.   Genitourinary: Negative.   Musculoskeletal: Negative.  Negative for back pain, myalgias and neck pain.  Skin: Negative.   Neurological: Positive for headaches. Negative for dizziness, tremors, facial asymmetry, weakness and light-headedness.  Hematological: Negative for adenopathy. Does not bruise/bleed easily.  Psychiatric/Behavioral: Negative.     Objective:  BP 110/70 (BP Location: Left Arm, Patient Position: Sitting, Cuff Size: Normal)   Pulse 87   Temp 98.2 F (36.8 C) (Oral)   Ht 5\' 5"  (1.651 m)   Wt 203 lb 8 oz (92.3 kg)   SpO2 98%   BMI 33.86 kg/m   BP Readings from Last 3 Encounters:  02/24/16 110/70  09/25/15 127/63  09/22/15 126/64    Wt Readings from Last 3 Encounters:  02/24/16 203 lb 8 oz (92.3 kg)  09/25/15 198 lb (89.8 kg)  09/22/15 198 lb (89.8 kg)    Physical Exam  Constitutional: She is oriented to person, place, and time.  Non-toxic appearance. She does not have a sickly appearance. She does not appear ill. No distress.  HENT:  Right Ear: Hearing, tympanic membrane, external ear and ear canal normal.  Left Ear: Hearing, tympanic membrane, external ear and ear canal normal.  Nose: No rhinorrhea, sinus tenderness or nasal deformity. Right sinus exhibits maxillary sinus tenderness. Right sinus exhibits no frontal sinus tenderness. Left sinus exhibits maxillary sinus tenderness. Left sinus  exhibits no frontal sinus tenderness.  Mouth/Throat: Oropharynx is clear and moist and mucous membranes are normal. Mucous membranes are not pale, not dry and not cyanotic. No oropharyngeal exudate, posterior oropharyngeal edema, posterior oropharyngeal erythema or tonsillar abscesses.  Eyes:  Conjunctivae and EOM are normal. Pupils are equal, round, and reactive to light. Right eye exhibits no discharge. Left eye exhibits no discharge. No scleral icterus.  Neck: Normal range of motion. Neck supple. No JVD present. No tracheal deviation present. No thyromegaly present.  Cardiovascular: Normal rate, regular rhythm, normal heart sounds and intact distal pulses.  Exam reveals no gallop and no friction rub.   No murmur heard. Pulmonary/Chest: Effort normal and breath sounds normal. No stridor. No respiratory distress. She has no wheezes. She has no rales. She exhibits no tenderness.  Abdominal: Soft. Bowel sounds are normal. She exhibits no distension and no mass. There is no tenderness. There is no rebound and no guarding.  Musculoskeletal: Normal range of motion. She exhibits no edema, tenderness or deformity.  Lymphadenopathy:    She has no cervical adenopathy.  Neurological: She is alert and oriented to person, place, and time. She has normal strength. She displays no atrophy and normal reflexes. No cranial nerve deficit or sensory deficit. She exhibits normal muscle tone. Coordination and gait normal. She displays Babinski's sign on the right side. She displays no Babinski's sign on the left side.  Reflex Scores:      Tricep reflexes are 0 on the right side and 0 on the left side.      Bicep reflexes are 0 on the right side and 0 on the left side.      Brachioradialis reflexes are 0 on the right side and 0 on the left side.      Patellar reflexes are 0 on the right side and 0 on the left side.      Achilles reflexes are 0 on the right side and 0 on the left side. Skin: Skin is warm and dry. No rash noted. She is not diaphoretic. No erythema. No pallor.  Psychiatric: She has a normal mood and affect. Her behavior is normal. Judgment and thought content normal.  Vitals reviewed.   Lab Results  Component Value Date   WBC 7.9 02/24/2016   HGB 13.4 02/24/2016   HCT 39.3 02/24/2016    PLT 428.0 (H) 02/24/2016   GLUCOSE 89 02/24/2016   CHOL 170 06/11/2015   TRIG 39.0 06/11/2015   HDL 55.90 06/11/2015   LDLCALC 107 (H) 06/11/2015   ALT 15 09/22/2015   AST 18 09/22/2015   NA 138 02/24/2016   K 3.8 02/24/2016   CL 103 02/24/2016   CREATININE 0.80 02/24/2016   BUN 7 02/24/2016   CO2 30 02/24/2016   TSH 1.31 06/11/2015   HGBA1C 5.5 02/24/2016    No results found.  Assessment & Plan:   Hensleigh was seen today for sinusitis and headache.  Diagnoses and all orders for this visit:  Vitamin B 12 deficiency- her CBC and B12 level are normal now, will continue B12 replacement therapy. -     CBC with Differential/Platelet; Future -     Vitamin B12; Future  Elevated blood sugar level- her A1c is 5.5%, she is very mildly prediabetic, no medications are needed to treat this. -     Basic metabolic panel; Future -     Hemoglobin A1c; Future  Intractable episodic headache, unspecified headache type-her neuro exam is normal with the exception of a  positive Babinski in her right foot. I will scan her brain to look for hydrocephalus, mass, CVA, optic neuropathy -     MR BRAIN WO CONTRAST; Future  Visual disturbance- as above -     MR BRAIN WO CONTRAST; Future  Acute non-recurrent maxillary sinusitis -     cefdinir (OMNICEF) 300 MG capsule; Take 1 capsule (300 mg total) by mouth 2 (two) times daily.   I have discontinued Ms. Meinecke's cetirizine-pseudoephedrine, fluticasone, and HYDROcodone-acetaminophen. I am also having her start on cefdinir. Additionally, I am having her maintain her esomeprazole and ibuprofen.  Meds ordered this encounter  Medications  . cefdinir (OMNICEF) 300 MG capsule    Sig: Take 1 capsule (300 mg total) by mouth 2 (two) times daily.    Dispense:  20 capsule    Refill:  0     Follow-up: Return in about 3 weeks (around 03/16/2016).  Scarlette Calico, MD

## 2016-02-24 NOTE — Progress Notes (Signed)
Pre visit review using our clinic review tool, if applicable. No additional management support is needed unless otherwise documented below in the visit note. 

## 2016-02-24 NOTE — Patient Instructions (Signed)
General Headache Without Cause A headache is pain or discomfort felt around the head or neck area. The specific cause of a headache may not be found. There are many causes and types of headaches. A few common ones are:  Tension headaches.  Migraine headaches.  Cluster headaches.  Chronic daily headaches.  Follow these instructions at home: Watch your condition for any changes. Take these steps to help with your condition: Managing pain  Take over-the-counter and prescription medicines only as told by your health care provider.  Lie down in a dark, quiet room when you have a headache.  If directed, apply ice to the head and neck area: ? Put ice in a plastic bag. ? Place a towel between your skin and the bag. ? Leave the ice on for 20 minutes, 2-3 times per day.  Use a heating pad or hot shower to apply heat to the head and neck area as told by your health care provider.  Keep lights dim if bright lights bother you or make your headaches worse. Eating and drinking  Eat meals on a regular schedule.  Limit alcohol use.  Decrease the amount of caffeine you drink, or stop drinking caffeine. General instructions  Keep all follow-up visits as told by your health care provider. This is important.  Keep a headache journal to help find out what may trigger your headaches. For example, write down: ? What you eat and drink. ? How much sleep you get. ? Any change to your diet or medicines.  Try massage or other relaxation techniques.  Limit stress.  Sit up straight, and do not tense your muscles.  Do not use tobacco products, including cigarettes, chewing tobacco, or e-cigarettes. If you need help quitting, ask your health care provider.  Exercise regularly as told by your health care provider.  Sleep on a regular schedule. Get 7-9 hours of sleep, or the amount recommended by your health care provider. Contact a health care provider if:  Your symptoms are not helped by  medicine.  You have a headache that is different from the usual headache.  You have nausea or you vomit.  You have a fever. Get help right away if:  Your headache becomes severe.  You have repeated vomiting.  You have a stiff neck.  You have a loss of vision.  You have problems with speech.  You have pain in the eye or ear.  You have muscular weakness or loss of muscle control.  You lose your balance or have trouble walking.  You feel faint or pass out.  You have confusion. This information is not intended to replace advice given to you by your health care provider. Make sure you discuss any questions you have with your health care provider. Document Released: 01/10/2005 Document Revised: 06/18/2015 Document Reviewed: 05/05/2014 Elsevier Interactive Patient Education  2017 [employer]  

## 2016-03-07 ENCOUNTER — Other Ambulatory Visit: Payer: Self-pay | Admitting: Internal Medicine

## 2016-03-07 ENCOUNTER — Ambulatory Visit
Admission: RE | Admit: 2016-03-07 | Discharge: 2016-03-07 | Disposition: A | Discharge status: Home / Self Care | Payer: 59 | Source: Ambulatory Visit | Attending: Internal Medicine | Admitting: Internal Medicine

## 2016-03-07 ENCOUNTER — Encounter: Payer: Self-pay | Admitting: Internal Medicine

## 2016-03-07 DIAGNOSIS — R51 Headache: Secondary | ICD-10-CM | POA: Diagnosis not present

## 2016-03-07 DIAGNOSIS — R930 Abnormal findings on diagnostic imaging of skull and head, not elsewhere classified: Secondary | ICD-10-CM

## 2016-03-07 DIAGNOSIS — H539 Unspecified visual disturbance: Secondary | ICD-10-CM

## 2016-03-07 DIAGNOSIS — R519 Headache, unspecified: Secondary | ICD-10-CM

## 2016-03-14 DIAGNOSIS — Z1231 Encounter for screening mammogram for malignant neoplasm of breast: Secondary | ICD-10-CM | POA: Diagnosis not present

## 2016-03-17 ENCOUNTER — Ambulatory Visit (INDEPENDENT_AMBULATORY_CARE_PROVIDER_SITE_OTHER): Payer: 59 | Admitting: Emergency Medicine

## 2016-03-17 DIAGNOSIS — E538 Deficiency of other specified B group vitamins: Secondary | ICD-10-CM | POA: Diagnosis not present

## 2016-03-17 MED ORDER — CYANOCOBALAMIN 1000 MCG/ML IJ SOLN
1000.0000 ug | Freq: Once | INTRAMUSCULAR | Status: AC
  Administered 2016-03-17: 1000 ug via INTRAMUSCULAR

## 2016-03-22 DIAGNOSIS — Z01419 Encounter for gynecological examination (general) (routine) without abnormal findings: Secondary | ICD-10-CM | POA: Diagnosis not present

## 2016-03-29 ENCOUNTER — Encounter: Payer: Self-pay | Admitting: Neurology

## 2016-03-29 ENCOUNTER — Ambulatory Visit (INDEPENDENT_AMBULATORY_CARE_PROVIDER_SITE_OTHER): Payer: 59 | Admitting: Neurology

## 2016-03-29 VITALS — BP 109/65 | HR 84 | Resp 16 | Ht 65.0 in | Wt 200.0 lb

## 2016-03-29 DIAGNOSIS — G935 Compression of brain: Secondary | ICD-10-CM

## 2016-03-29 DIAGNOSIS — G44229 Chronic tension-type headache, not intractable: Secondary | ICD-10-CM | POA: Diagnosis not present

## 2016-03-29 MED ORDER — NORTRIPTYLINE HCL 10 MG PO CAPS: 10.0000 mg | ORAL_CAPSULE | Freq: Every day | ORAL | 6 refills | Status: DC

## 2016-03-29 NOTE — Progress Notes (Signed)
[id number]    Provider:  Dr Jaynee Eagles Referring Provider: Janith Lima, MD Primary Care Physician:  Scarlette Calico, MD  CC:  Abnormal MRI brain  HPI:  Rhonda [name] is a 46 y.o. female here as a referral from Dr. Ronnald Ramp for abnormal MRI brain. No Hx of headaches until recently. PMHx B12 deficiency She has had headaches for 2 months. No inciting events or head trauma.  They are in the temples. She has shooting pain in the back of the head (points to occipital lobes). She also has sinus issues and has decreased smell and taste. She has headaches every day. They are frontal daily. She does not often wake up with a headaches. Not pounding or throbbing. Advil can help sometimes. Sleeping helps. No light or sound sensitivity. She has blurry vision but she needs glasses, not associated with the headaches. No facial numbness or weakness. Headache is not positional and not worse with bending over or laying down. If she has to strain the headaches worsens and she needs to stop so she tries not to strain or valsalva. No neck pain. No difficulty swallowing, nausea, dizizness or vertigo. No FHx of migraines.   Reviewed notes, labs and imaging from outside physicians, which showed:  B12 was 161 2 years ago last 784.  Personally reviewed imaging and agree with the following:  FINDINGS: Brain: Ventricle size normal. Cerebral volume normal. Negative for acute infarct. Negative for hemorrhage or mass.  Scattered small hyperintensities in the anterior frontal white matter bilaterally. Remaining white matter within normal limits. Brainstem and cerebellum normal.  Cerebellar tonsils low lying, 11 mm below V4 foramen magnum. No significant impaction of the tonsils. Pituitary normal in size.  Vascular: Normal arterial flow void.  Skull and upper cervical spine: Negative  Sinuses/Orbits: Minimal mucosal edema in the paranasal sinuses. No orbital lesion.  Other:  None  IMPRESSION: No acute intracranial abnormality  Scattered small white matter hyperintensities in the frontal white matter bilaterally. These are nonspecific. Differential includes chronic microvascular ischemia, vasculitis, migraine headaches, demyelinating disease among other causes  Low lying cerebellar tonsils 11 mm below the foramen magnum.  Review of Systems: Patient complains of symptoms per HPI as well as the following symptoms: No CP, no SOB. Pertinent negatives per HPI. All others negative.   Social History   Social History  . Marital status: Single    Spouse name: N/A  . Number of children: 0  . Years of education: Assoc   Occupational History  . Office Support    Social History Main Topics  . Smoking status: Never Smoker  . Smokeless tobacco: Never Used  . Alcohol use No  . Drug use: No  . Sexual activity: Not on file   Other Topics Concern  . Not on file   Social History Narrative   College grad   Single   Bought her own home in '09   Occupation: Programme researcher, broadcasting/film/video and cleaning   Fun: Resting, yard work   Denies religious beliefs effecting health care.    Drinks caffeine daily     Family History  Problem Relation Age of Onset  . Heart disease Father   . Hypertension Father   . Diabetes Father   . Heart disease Maternal Grandmother   . Headache Neg Hx     Past Medical History:  Diagnosis Date  . Allergic rhinitis   . Allergy   . B12 deficiency   . Carpal tunnel syndrome   . Dysfunction of  eustachian tube     Past Surgical History:  Procedure Laterality Date  . BUNIONECTOMY     right   . CHOLECYSTECTOMY N/A 09/25/2015   Procedure: LAPAROSCOPIC CHOLECYSTECTOMY WITH INTRAOPERATIVE CHOLANGIOGRAM;  Surgeon: Alphonsa Overall, MD;  Location: [facility];  Service: General;  Laterality: N/A;  . TONSILLECTOMY      Current Outpatient Prescriptions  Medication Sig Dispense Refill  . vitamin B-12 (CYANOCOBALAMIN) 100 MCG tablet Take 100 mcg by mouth  daily.    . nortriptyline (PAMELOR) 10 MG capsule Take 1 capsule (10 mg total) by mouth at bedtime. 30 capsule 6   No current facility-administered medications for this visit.     Allergies as of 03/29/2016 - Review Complete 03/29/2016  Allergen Reaction Noted  . Oseltamivir phosphate  01/05/2009  . Amoxicillin Hives and Rash 12/28/2010  . Doxycycline Rash 07/14/2014    Vitals: BP 109/65   Pulse 84   Resp 16   Ht 5\' 5"  (1.651 m)   Wt 200 lb (90.7 kg)   BMI 33.28 kg/m  Last Weight:  Wt Readings from Last 1 Encounters:  03/29/16 200 lb (90.7 kg)   Last Height:   Ht Readings from Last 1 Encounters:  03/29/16 5\' 5"  (1.651 m)    Physical exam: Exam: Gen: NAD, conversant, well nourised, obese, well groomed                     CV: RRR, no MRG. No Carotid Bruits. No peripheral edema, warm, nontender Eyes: Conjunctivae clear without exudates or hemorrhage  Neuro: Detailed Neurologic Exam  Speech:    Speech is normal; fluent and spontaneous with normal comprehension.  Cognition:    The patient is oriented to person, place, and time;     recent and remote memory intact;     language fluent;     normal attention, concentration,     fund of knowledge Cranial Nerves:    The pupils are equal, round, and reactive to light. The fundi are normal and spontaneous venous pulsations are present. Visual fields are full to finger confrontation. Extraocular movements are intact. Trigeminal sensation is intact and the muscles of mastication are normal. The face is symmetric. The palate elevates in the midline. Hearing intact. Voice is normal. Shoulder shrug is normal. The tongue has normal motion without fasciculations.   Coordination:    Normal finger to nose and heel to shin. Normal rapid alternating movements.   Gait:    Heel-toe and tandem gait are normal.   Motor Observation:    No asymmetry, no atrophy, and no involuntary movements noted. Tone:    Normal muscle tone.     Posture:    Posture is normal. normal erect    Strength:    Strength is V/V in the upper and lower limbs.      Sensation: intact to LT     Reflex Exam:  DTR's:    Deep tendon reflexes in the upper and lower extremities are normal bilaterally.   Toes:    The toes are downgoing bilaterally.   Clonus:    Clonus is absent.  Physical exam: Exam: Gen: NAD, conversant, well nourised, obese, well groomed                     CV: RRR, no MRG. No Carotid Bruits. No peripheral edema, warm, nontender Eyes: Conjunctivae clear without exudates or hemorrhage  Neuro: Detailed Neurologic Exam  Speech:    Speech is normal; fluent and  spontaneous with normal comprehension.  Cognition:    The patient is oriented to person, place, and time;     recent and remote memory intact;     language fluent;     normal attention, concentration,     fund of knowledge Cranial Nerves:    The pupils are equal, round, and reactive to light. The fundi are normal and spontaneous venous pulsations are present. Visual fields are full to finger confrontation. Extraocular movements are intact. Trigeminal sensation is intact and the muscles of mastication are normal. The face is symmetric. The palate elevates in the midline. Hearing intact. Voice is normal. Shoulder shrug is normal. The tongue has normal motion without fasciculations.   Coordination:    Normal finger to nose and heel to shin. Normal rapid alternating movements.   Gait:    Heel-toe and tandem gait are normal.   Motor Observation:    No asymmetry, no atrophy, and no involuntary movements noted. Tone:    Normal muscle tone.    Posture:    Posture is normal. normal erect    Strength:    Strength is V/V in the upper and lower limbs.      Sensation: intact to LT     Reflex Exam:  DTR's:    Deep tendon reflexes in the upper and lower extremities are normal bilaterally.   Toes:    The toes are downgoing bilaterally.   Clonus:    Clonus  is absent.     Assessment/Plan:  46 year old with tension type headaches. MRI showed 62mm Chiari and non-specific white matter changes on MRI brain that may be microvascular changes and benign. She does have headaches that worsen on valsalva but otherwise headaches sound tension-type.   Refer to [facility] for consultation on 24mm  Nortriptyline qhs Repeat MRI in 6 months to a year to follow white matter changes RTC 6 months  Discussed: To prevent or relieve headaches, try the following: Cool Compress. Lie down and place a cool compress on your head.  Avoid headache triggers. If certain foods or odors seem to have triggered your migraines in the past, avoid them. A headache diary might help you identify triggers.  Include physical activity in your daily routine. Try a daily walk or other moderate aerobic exercise.  Manage stress. Find healthy ways to cope with the stressors, such as delegating tasks on your to-do list.  Practice relaxation techniques. Try deep breathing, yoga, massage and visualization.  Eat regularly. Eating regularly scheduled meals and maintaining a healthy diet might help prevent headaches. Also, drink plenty of fluids.  Follow a regular sleep schedule. Sleep deprivation might contribute to headaches Consider biofeedback. With this mind-body technique, you learn to control certain bodily functions - such as muscle tension, heart rate and blood pressure - to prevent headaches or reduce headache pain.    Proceed to emergency room if you experience new or worsening symptoms or symptoms do not resolve, if you have new neurologic symptoms or if headache is severe, or for any concerning symptom.    Sarina Ill, MD  [facility] [address] [place], [place] [address]  Phone [phone] Fax [phone]

## 2016-03-29 NOTE — Patient Instructions (Addendum)
Remember to drink plenty of fluid, eat healthy meals and do not skip any meals. Try to eat protein with a every meal and eat a healthy snack such as fruit or nuts in between meals. Try to keep a regular sleep-wake schedule and try to exercise daily, particularly in the form of walking, 20-30 minutes a day, if you can.   As far as your medications are concerned, I would like to suggest: Nortriptyline at bedtime  As far as diagnostic testing: NSY evaluation  I would like to see you back in 1 year, sooner if we need to. Please call us with any interim questions, concerns, problems, updates or refill requests.   Our phone number is [phone]. We also have an after hours call service for urgent matters and there is a physician on-call for urgent questions. For any emergencies you know to call 911 or go to the nearest emergency room  Nortriptyline capsules What is this medicine? NORTRIPTYLINE (nor TRIP ti leen) is used to treat headaches and migraines. This medicine may be used for other purposes; ask your health care provider or pharmacist if you have questions. COMMON BRAND NAME(S): Aventyl, Pamelor What should I tell my health care provider before I take this medicine? They need to know if you have any of these conditions: -an alcohol problem -bipolar disorder or schizophrenia -difficulty passing urine, prostate trouble -glaucoma -heart disease or recent heart attack -liver disease -over active thyroid -seizures -thoughts or plans of suicide or a previous suicide attempt or family history of suicide attempt -an unusual or allergic reaction to nortriptyline, other medicines, foods, dyes, or preservatives -pregnant or trying to get pregnant -breast-feeding How should I use this medicine? Take this medicine by mouth with a glass of water. Follow the directions on the prescription label. Take your doses at regular intervals. Do not take it more often than directed. Do not stop taking this  medicine suddenly except upon the advice of your doctor. Stopping this medicine too quickly may cause serious side effects or your condition may worsen. A special MedGuide will be given to you by the pharmacist with each prescription and refill. Be sure to read this information carefully each time. Talk to your pediatrician regarding the use of this medicine in children. Special care may be needed. Overdosage: If you think you have taken too much of this medicine contact a poison control center or emergency room at once. NOTE: This medicine is only for you. Do not share this medicine with others. What if I miss a dose? If you miss a dose, take it as soon as you can. If it is almost time for your next dose, take only that dose. Do not take double or extra doses. What may interact with this medicine? Do not take this medicine with any of the following medications: -arsenic trioxide -certain medicines medicines for irregular heart beat -cisapride -halofantrine -linezolid -MAOIs like Carbex, Eldepryl, Marplan, Nardil, and Parnate -methylene blue (injected into a vein) -other medicines for mental depression -phenothiazines like perphenazine, thioridazine and chlorpromazine -pimozide -probucol -procarbazine -sparfloxacin -St. John's Wort -ziprasidone This medicine may also interact with any of the following medications: -atropine and related drugs like hyoscyamine, scopolamine, tolterodine and others -barbiturate medicines for inducing sleep or treating seizures, such as phenobarbital -cimetidine -medicines for diabetes -medicines for seizures like carbamazepine or phenytoin -reserpine -thyroid medicine This list may not describe all possible interactions. Give your health care provider a list of all the medicines, herbs, non-prescription drugs, or dietary  supplements you use. Also tell them if you smoke, drink alcohol, or use illegal drugs. Some items may interact with your medicine. What  should I watch for while using this medicine? Tell your doctor if your symptoms do not get better or if they get worse. Visit your doctor or health care professional for regular checks on your progress. Because it may take several weeks to see the full effects of this medicine, it is important to continue your treatment as prescribed by your doctor. Patients and their families should watch out for new or worsening thoughts of suicide or depression. Also watch out for sudden changes in feelings such as feeling anxious, agitated, panicky, irritable, hostile, aggressive, impulsive, severely restless, overly excited and hyperactive, or not being able to sleep. If this happens, especially at the beginning of treatment or after a change in dose, call your health care professional. Rhonda [name] may get drowsy or dizzy. Do not drive, use machinery, or do anything that needs mental alertness until you know how this medicine affects you. Do not stand or sit up quickly, especially if you are an older patient. This reduces the risk of dizzy or fainting spells. Alcohol may interfere with the effect of this medicine. Avoid alcoholic drinks. Do not treat yourself for coughs, colds, or allergies without asking your doctor or health care professional for advice. Some ingredients can increase possible side effects. Your mouth may get dry. Chewing sugarless gum or sucking hard candy, and drinking plenty of water may help. Contact your doctor if the problem does not go away or is severe. This medicine may cause dry eyes and blurred vision. If you wear contact lenses you may feel some discomfort. Lubricating drops may help. See your eye doctor if the problem does not go away or is severe. This medicine can cause constipation. Try to have a bowel movement at least every 2 to 3 days. If you do not have a bowel movement for 3 days, call your doctor or health care professional. This medicine can make you more sensitive to the sun. Keep out  of the sun. If you cannot avoid being in the sun, wear protective clothing and use sunscreen. Do not use sun lamps or tanning beds/booths. What side effects may I notice from receiving this medicine? Side effects that you should report to your doctor or health care professional as soon as possible: -allergic reactions like skin rash, itching or hives, swelling of the face, lips, or tongue -anxious -breathing problems -changes in vision -confusion -elevated mood, decreased need for sleep, racing thoughts, impulsive behavior -eye pain -fast, irregular heartbeat -feeling faint or lightheaded, falls -feeling agitated, angry, or irritable -fever with increased sweating -hallucination, loss of contact with reality -seizures -stiff muscles -suicidal thoughts or other mood changes -tingling, pain, or numbness in the feet or hands -trouble passing urine or change in the amount of urine -trouble sleeping -unusually weak or tired -vomiting -yellowing of the eyes or skin Side effects that usually do not require medical attention (report to your doctor or health care professional if they continue or are bothersome): -change in sex drive or performance -change in appetite or weight -constipation -dizziness -dry mouth -nausea -tired -tremors -upset stomach This list may not describe all possible side effects. Call your doctor for medical advice about side effects. You may report side effects to FDA at 1-800-FDA-1088. Where should I keep my medicine? Keep out of the reach of children. Store at room temperature between 15 and 30 degrees C (  59 and 86 degrees F). Keep container tightly closed. Throw away any unused medicine after the expiration date. NOTE: This sheet is a summary. It may not cover all possible information. If you have questions about this medicine, talk to your doctor, pharmacist, or health care provider.  2018 Elsevier/Gold Standard (2015-06-12 12:53:08)

## 2016-04-11 DIAGNOSIS — Q0700 Arnold-Chiari syndrome without spina bifida or hydrocephalus: Secondary | ICD-10-CM | POA: Diagnosis not present

## 2016-04-13 ENCOUNTER — Telehealth: Payer: Self-pay | Admitting: Neurology

## 2016-04-13 NOTE — Telephone Encounter (Signed)
Pt said that she forgot all about the Rx that Dr Jaynee Eagles called in for her but would like to know if she would send the nortriptyline (PAMELOR) 10 MG capsule once again to the  [place], [place]  Pt can be reached at [phone]

## 2016-04-13 NOTE — Telephone Encounter (Signed)
Returned pt TC. Said that she never picked up the rx that was sent in at last OV so the pharmacy put it back. Let her know that she should still have refills on file and would need to contact her pharmacy and request that they fill again in order for her to pick it up. Voiced understanding and appreciation for call.

## 2016-04-14 ENCOUNTER — Ambulatory Visit (INDEPENDENT_AMBULATORY_CARE_PROVIDER_SITE_OTHER): Payer: 59 | Admitting: General Practice

## 2016-04-14 DIAGNOSIS — E538 Deficiency of other specified B group vitamins: Secondary | ICD-10-CM | POA: Diagnosis not present

## 2016-04-14 MED ORDER — CYANOCOBALAMIN 1000 MCG/ML IJ SOLN
1000.0000 ug | Freq: Once | INTRAMUSCULAR | Status: AC
  Administered 2016-04-14: 1000 ug via INTRAMUSCULAR

## 2016-05-16 ENCOUNTER — Ambulatory Visit (INDEPENDENT_AMBULATORY_CARE_PROVIDER_SITE_OTHER): Payer: 59

## 2016-05-16 DIAGNOSIS — E538 Deficiency of other specified B group vitamins: Secondary | ICD-10-CM | POA: Diagnosis not present

## 2016-05-16 MED ORDER — CYANOCOBALAMIN 1000 MCG/ML IJ SOLN
1000.0000 ug | Freq: Once | INTRAMUSCULAR | Status: AC
  Administered 2016-05-16: 1000 ug via INTRAMUSCULAR

## 2016-05-26 DIAGNOSIS — H04123 Dry eye syndrome of bilateral lacrimal glands: Secondary | ICD-10-CM | POA: Diagnosis not present

## 2016-06-15 ENCOUNTER — Telehealth: Payer: Self-pay

## 2016-06-15 ENCOUNTER — Ambulatory Visit (INDEPENDENT_AMBULATORY_CARE_PROVIDER_SITE_OTHER): Payer: 59

## 2016-06-15 DIAGNOSIS — E538 Deficiency of other specified B group vitamins: Secondary | ICD-10-CM

## 2016-06-15 MED ORDER — CYANOCOBALAMIN 1000 MCG/ML IJ SOLN
1000.0000 ug | Freq: Once | INTRAMUSCULAR | Status: AC
  Administered 2016-06-15: 1000 ug via INTRAMUSCULAR

## 2016-06-15 NOTE — Telephone Encounter (Signed)
Routing to dr jones---patient's b12 lab value (jan/2018) was 784---she came in for nurse visit today and recd b12 injection---do you want patient to continue injections or stop for now and continue to monitor levels---patient is in agreement with d/c b12 injections for awhile to see if she maintains a good level---please advise, I will send mychart message to patient and may need to schedule next nurse visit if you want to continue injections---she is not currently taking b12 supplements orally---would you want her to start this as well---please advise, thanks

## 2016-07-29 ENCOUNTER — Ambulatory Visit (INDEPENDENT_AMBULATORY_CARE_PROVIDER_SITE_OTHER): Payer: 59

## 2016-07-29 DIAGNOSIS — E538 Deficiency of other specified B group vitamins: Secondary | ICD-10-CM

## 2016-07-29 MED ORDER — CYANOCOBALAMIN 1000 MCG/ML IJ SOLN
1000.0000 ug | Freq: Once | INTRAMUSCULAR | Status: AC
  Administered 2016-07-29: 1000 ug via INTRAMUSCULAR

## 2016-08-29 ENCOUNTER — Ambulatory Visit (INDEPENDENT_AMBULATORY_CARE_PROVIDER_SITE_OTHER): Payer: 59

## 2016-08-29 DIAGNOSIS — E538 Deficiency of other specified B group vitamins: Secondary | ICD-10-CM | POA: Diagnosis not present

## 2016-08-29 MED ORDER — CYANOCOBALAMIN 1000 MCG/ML IJ SOLN
1000.0000 ug | Freq: Once | INTRAMUSCULAR | Status: AC
  Administered 2016-08-29: 1000 ug via INTRAMUSCULAR

## 2016-09-27 ENCOUNTER — Ambulatory Visit (INDEPENDENT_AMBULATORY_CARE_PROVIDER_SITE_OTHER): Payer: 59 | Admitting: Internal Medicine

## 2016-09-27 ENCOUNTER — Encounter: Payer: Self-pay | Admitting: Internal Medicine

## 2016-09-27 VITALS — BP 118/60 | HR 91 | Temp 98.1°F | Resp 16 | Ht 65.0 in | Wt 196.0 lb

## 2016-09-27 DIAGNOSIS — H539 Unspecified visual disturbance: Secondary | ICD-10-CM

## 2016-09-27 DIAGNOSIS — Q0700 Arnold-Chiari syndrome without spina bifida or hydrocephalus: Secondary | ICD-10-CM | POA: Diagnosis not present

## 2016-09-27 DIAGNOSIS — R930 Abnormal findings on diagnostic imaging of skull and head, not elsewhere classified: Secondary | ICD-10-CM

## 2016-09-27 DIAGNOSIS — R739 Hyperglycemia, unspecified: Secondary | ICD-10-CM

## 2016-09-27 DIAGNOSIS — R200 Anesthesia of skin: Secondary | ICD-10-CM

## 2016-09-27 DIAGNOSIS — R202 Paresthesia of skin: Secondary | ICD-10-CM | POA: Diagnosis not present

## 2016-09-27 DIAGNOSIS — E538 Deficiency of other specified B group vitamins: Secondary | ICD-10-CM | POA: Diagnosis not present

## 2016-09-27 DIAGNOSIS — Q0701 Arnold-Chiari syndrome with spina bifida: Secondary | ICD-10-CM

## 2016-09-27 LAB — POCT GLYCOSYLATED HEMOGLOBIN (HGB A1C): HEMOGLOBIN A1C: 5.1

## 2016-09-27 NOTE — Progress Notes (Signed)
Subjective:  Patient ID: Rhonda [name], female    DOB: 02/18/1970  Age: 46 y.o. MRN: [id number]  CC: Numbness   HPI Rhonda [name] presents for f/up - She has a history of an abnormal MRI with a Chiari malformation and white matter changes. She was seen about 6 or 7 months ago by a neurologist and was felt to have migraine headaches. Nortriptyline was prescribed but she is not taking it. She saw neurosurgery and they felt that the Chiari formation did not require surgical intervention. She was feeling well until about 3 weeks ago when she started developing numbness and tingling symmetrically over her fingers and toes. She has also had a few episodes of visual disturbance that she describes as decreased visual acuity. She's had no headaches.  Outpatient Medications Prior to Visit  Medication Sig Dispense Refill  . vitamin B-12 (CYANOCOBALAMIN) 100 MCG tablet Take 100 mcg by mouth daily.    . nortriptyline (PAMELOR) 10 MG capsule Take 1 capsule (10 mg total) by mouth at bedtime. 30 capsule 6   No facility-administered medications prior to visit.     ROS Review of Systems  Constitutional: Negative.  Negative for appetite change, diaphoresis, fatigue and unexpected weight change.  HENT: Negative.  Negative for trouble swallowing.   Eyes: Positive for visual disturbance. Negative for pain and redness.  Respiratory: Negative.  Negative for cough, chest tightness, shortness of breath and wheezing.   Cardiovascular: Negative for chest pain, palpitations and leg swelling.  Gastrointestinal: Negative for abdominal pain, constipation, diarrhea and nausea.  Endocrine: Negative.   Genitourinary: Negative for difficulty urinating.  Musculoskeletal: Negative.  Negative for back pain, myalgias and neck pain.  Skin: Negative.   Allergic/Immunologic: Negative.   Neurological: Positive for numbness. Negative for dizziness, facial asymmetry, weakness, light-headedness and headaches.  Hematological:  Negative for adenopathy. Does not bruise/bleed easily.  Psychiatric/Behavioral: Negative.     Objective:  BP 118/60 (BP Location: Left Arm, Patient Position: Sitting, Cuff Size: Normal)   Pulse 91   Temp 98.1 F (36.7 C) (Oral)   Resp 16   Ht 5\' 5"  (1.651 m)   Wt 196 lb (88.9 kg)   SpO2 100%   BMI 32.62 kg/m   BP Readings from Last 3 Encounters:  09/27/16 118/60  03/29/16 109/65  02/24/16 110/70    Wt Readings from Last 3 Encounters:  09/27/16 196 lb (88.9 kg)  03/29/16 200 lb (90.7 kg)  02/24/16 203 lb 8 oz (92.3 kg)    Physical Exam  Constitutional: No distress.  HENT:  Head: Normocephalic and atraumatic.  Mouth/Throat: Oropharynx is clear and moist. No oropharyngeal exudate.  Eyes: Pupils are equal, round, and reactive to light. Conjunctivae and EOM are normal. Right eye exhibits no discharge. Left eye exhibits no discharge. No scleral icterus.  Neck: Normal range of motion. Neck supple. No JVD present. No thyromegaly present.  Cardiovascular: Normal rate, regular rhythm and intact distal pulses.  Exam reveals no gallop and no friction rub.   No murmur heard. Pulmonary/Chest: Effort normal and breath sounds normal. No respiratory distress. She has no wheezes. She has no rales. She exhibits no tenderness.  Abdominal: Soft. Bowel sounds are normal. She exhibits no distension and no mass. There is no tenderness. There is no rebound and no guarding.  Musculoskeletal: Normal range of motion. She exhibits no edema, tenderness or deformity.  Lymphadenopathy:    She has no cervical adenopathy.  Neurological: She has normal strength. She displays no atrophy, no  tremor and normal reflexes. No cranial nerve deficit or sensory deficit. She exhibits normal muscle tone. She displays a negative Romberg sign. She displays no seizure activity. Coordination and gait normal. She displays no Babinski's sign on the right side. She displays no Babinski's sign on the left side.  Reflex  Scores:      Tricep reflexes are 0 on the right side and 0 on the left side.      Bicep reflexes are 0 on the right side and 0 on the left side.      Brachioradialis reflexes are 0 on the right side and 0 on the left side.      Patellar reflexes are 0 on the right side and 0 on the left side.      Achilles reflexes are 0 on the right side and 0 on the left side. Skin: Skin is warm and dry. No rash noted. She is not diaphoretic. No erythema. No pallor.  Psychiatric: She has a normal mood and affect. Her behavior is normal. Judgment and thought content normal.  Vitals reviewed.   Lab Results  Component Value Date   WBC 7.9 02/24/2016   HGB 13.4 02/24/2016   HCT 39.3 02/24/2016   PLT 428.0 (H) 02/24/2016   GLUCOSE 89 02/24/2016   CHOL 170 06/11/2015   TRIG 39.0 06/11/2015   HDL 55.90 06/11/2015   LDLCALC 107 (H) 06/11/2015   ALT 15 09/22/2015   AST 18 09/22/2015   NA 138 02/24/2016   K 3.8 02/24/2016   CL 103 02/24/2016   CREATININE 0.80 02/24/2016   BUN 7 02/24/2016   CO2 30 02/24/2016   TSH 1.31 06/11/2015   HGBA1C 5.1 09/27/2016    Mr Brain Wo Contrast  Result Date: 03/07/2016 CLINICAL DATA:  Intractable episodic headache and visual change EXAM: MRI HEAD WITHOUT CONTRAST TECHNIQUE: Multiplanar, multiecho pulse sequences of the brain and surrounding structures were obtained without intravenous contrast. COMPARISON:  None. FINDINGS: Brain: Ventricle size normal. Cerebral volume normal. Negative for acute infarct. Negative for hemorrhage or mass. Scattered small hyperintensities in the anterior frontal white matter bilaterally. Remaining white matter within normal limits. Brainstem and cerebellum normal. Cerebellar tonsils low lying, 11 mm below V4 foramen magnum. No significant impaction of the tonsils. Pituitary normal in size. Vascular: Normal arterial flow void. Skull and upper cervical spine: Negative Sinuses/Orbits: Minimal mucosal edema in the paranasal sinuses. No orbital  lesion. Other: None IMPRESSION: No acute intracranial abnormality Scattered small white matter hyperintensities in the frontal white matter bilaterally. These are nonspecific. Differential includes chronic microvascular ischemia, vasculitis, migraine headaches, demyelinating disease among other causes Low lying cerebellar tonsils 11 mm below the foramen magnum. Electronically Signed   By: Franchot Gallo M.D.   On: 03/07/2016 09:42    Assessment & Plan:   Aeriel was seen today for numbness.  Diagnoses and all orders for this visit:  Vitamin B 12 deficiency- will continue B12 replacement therapy.  Elevated blood sugar level- her A1c is normal at 5.1%. -     POCT glycosylated hemoglobin (Hb A1C)  Abnormal MRI of head- as below -     MR Brain Wo Contrast; Future  Chiari malformation type II ([place])- as below -     MR Brain Wo Contrast; Future  Visual disturbance- as below -     MR Brain Wo Contrast; Future  Numbness and tingling of upper and lower extremities of both sides- she has a recurrence of symptoms but her neurologic exam is normal.  According to the note from her neurologist she was supposed to undergo another MRI at the six-month interval. We are now at the 7 month interval. I have ordered another MRI to see if there any changes in the interval. -     MR Brain Wo Contrast; Future   I have discontinued Ms. Colden's nortriptyline. I am also having her maintain her vitamin B-12.  No orders of the defined types were placed in this encounter.    Follow-up: No Follow-up on file.  Scarlette Calico, MD

## 2016-09-28 NOTE — Patient Instructions (Signed)
Vitamin B12 Deficiency Vitamin B12 deficiency occurs when the body does not have enough vitamin B12. Vitamin B12 is an important vitamin. The body needs vitamin B12:  To make red blood cells.  To make DNA. This is the genetic material inside cells.  To help the nerves work properly so they can carry messages from the brain to the body.  Vitamin B12 deficiency can cause various health problems, such as a low red blood cell count (anemia) or nerve damage. What are the causes? This condition may be caused by:  Not eating enough foods that contain vitamin B12.  Not having enough stomach acid and digestive fluids to properly absorb vitamin B12 from the food that you eat.  Certain digestive system diseases that make it hard to absorb vitamin B12. These diseases include Crohn disease, chronic pancreatitis, and cystic fibrosis.  Pernicious anemia. This is a condition in which the body does not make enough of a protein (intrinsic factor), resulting in too few red blood cells.  Having a surgery in which part of the stomach or small intestine is removed.  Taking certain medicines that make it hard for the body to absorb vitamin B12. These medicines include: ? Heartburn medicine (antacids and proton pump inhibitors). ? An antibiotic medicine called neomycin. ? Some medicines that are used to treat diabetes, tuberculosis, gout, or high cholesterol.  What increases the risk? The following factors may make you more likely to develop a B12 deficiency:  Being older than age 50.  Eating a vegetarian or vegan diet, especially while you are pregnant.  Eating a poor diet while you are pregnant.  Taking certain drugs.  Having alcoholism.  What are the signs or symptoms? In some cases, there are no symptoms of this condition. If the condition leads to anemia or nerve damage, various symptoms can occur, such as:  Weakness.  Fatigue.  Loss of appetite.  Weight loss.  Numbness or tingling  in your hands and feet.  Redness and burning of the tongue.  Confusion or memory problems.  Depression.  Sensory problems, such as color blindness, ringing in the ears, or loss of taste.  Diarrhea or constipation.  Trouble walking.  If anemia is severe, symptoms can include:  Shortness of breath.  Dizziness.  Rapid heart rate (tachycardia).  How is this diagnosed? This condition may be diagnosed with a blood test to measure the level of vitamin B12 in your blood. You may have other tests to help find the cause of your vitamin B12 deficiency. These tests may include:  A complete blood count (CBC). This is a group of tests that measure certain characteristics of blood cells.  A blood test to measure intrinsic factor.  An endoscopy. In this procedure, a thin tube with a camera on the end is used to look into your stomach or intestines.  How is this treated? Treatment for this condition depends on the cause. Common treatment options include:  Changing your eating and drinking habits, such as: ? Eating more foods that contain vitamin B12. ? Drinking less alcohol or no alcohol.  Taking vitamin B12 supplements. Your health care provider will tell you which dosage is best for you.  Getting vitamin B12 injections.  Follow these instructions at home:  Take supplements only as told by your health care provider. Follow the directions carefully.  Get any injections that are prescribed by your health care provider.  Do not miss your appointments.  Eat lots of healthy foods that contain vitamin B12.   Ask your health care provider if you should work with a dietitian. Foods that contain vitamin B12 include: ? Meat. ? Meat from birds (poultry). ? Fish. ? Eggs. ? Cereal and dairy products that are fortified. This means that vitamin B12 has been added to the food. Check the label on the package to see if the food is fortified.  Do not abuse alcohol.  Keep all follow-up visits as  told by your health care provider. This is important. Contact a health care provider if:  Your symptoms come back. Get help right away if:  You develop shortness of breath.  You have chest pain.  You become dizzy or you lose consciousness. This information is not intended to replace advice given to you by your health care provider. Make sure you discuss any questions you have with your health care provider. Document Released: 04/04/2011 Document Revised: 06/24/2015 Document Reviewed: 05/28/2014 Elsevier Interactive Patient Education  2018 [employer]  

## 2016-09-29 ENCOUNTER — Ambulatory Visit (INDEPENDENT_AMBULATORY_CARE_PROVIDER_SITE_OTHER): Payer: 59 | Admitting: General Practice

## 2016-09-29 DIAGNOSIS — E538 Deficiency of other specified B group vitamins: Secondary | ICD-10-CM

## 2016-09-29 MED ORDER — CYANOCOBALAMIN 1000 MCG/ML IJ SOLN
1000.0000 ug | Freq: Once | INTRAMUSCULAR | Status: AC
  Administered 2016-09-29: 1000 ug via INTRAMUSCULAR

## 2016-10-05 ENCOUNTER — Ambulatory Visit
Admission: RE | Admit: 2016-10-05 | Discharge: 2016-10-05 | Disposition: A | Discharge status: Home / Self Care | Payer: 59 | Source: Ambulatory Visit | Attending: Internal Medicine | Admitting: Internal Medicine

## 2016-10-05 DIAGNOSIS — R202 Paresthesia of skin: Secondary | ICD-10-CM

## 2016-10-05 DIAGNOSIS — H539 Unspecified visual disturbance: Secondary | ICD-10-CM

## 2016-10-05 DIAGNOSIS — R200 Anesthesia of skin: Secondary | ICD-10-CM

## 2016-10-05 DIAGNOSIS — R930 Abnormal findings on diagnostic imaging of skull and head, not elsewhere classified: Secondary | ICD-10-CM

## 2016-10-05 DIAGNOSIS — Q0701 Arnold-Chiari syndrome with spina bifida: Secondary | ICD-10-CM

## 2016-10-05 DIAGNOSIS — N200 Calculus of kidney: Secondary | ICD-10-CM | POA: Diagnosis not present

## 2016-10-06 ENCOUNTER — Encounter: Payer: Self-pay | Admitting: Internal Medicine

## 2016-10-12 ENCOUNTER — Encounter: Payer: Self-pay | Admitting: Internal Medicine

## 2016-10-12 ENCOUNTER — Ambulatory Visit (INDEPENDENT_AMBULATORY_CARE_PROVIDER_SITE_OTHER): Payer: 59 | Admitting: Internal Medicine

## 2016-10-12 VITALS — BP 134/80 | HR 74 | Temp 98.0°F | Resp 16 | Ht 65.0 in | Wt 198.0 lb

## 2016-10-12 DIAGNOSIS — J301 Allergic rhinitis due to pollen: Secondary | ICD-10-CM | POA: Diagnosis not present

## 2016-10-12 DIAGNOSIS — B309 Viral conjunctivitis, unspecified: Secondary | ICD-10-CM | POA: Diagnosis not present

## 2016-10-12 MED ORDER — CETIRIZINE-PSEUDOEPHEDRINE ER 5-120 MG PO TB12: 1.0000 | ORAL_TABLET | Freq: Two times a day (BID) | ORAL | 11 refills | Status: DC

## 2016-10-12 MED ORDER — NEOMYCIN-POLYMYXIN-HC 3.5-10000-1 OP SUSP: 3.0000 [drp] | Freq: Three times a day (TID) | OPHTHALMIC | 0 refills | Status: DC

## 2016-10-12 NOTE — Patient Instructions (Signed)

## 2016-10-12 NOTE — Progress Notes (Signed)
Subjective:  Patient ID: Rhonda [name], female    DOB: 12-Jan-1971  Age: 46 y.o. MRN: [id number]  CC: Allergic Rhinitis  and Conjunctivitis   HPI Rhonda [name] presents for a 2 day hx of bilateral eye itching with mild redness and matting. She also complains of nasal congestion, sneezing, runny nose, and postnasal drip.  Outpatient Medications Prior to Visit  Medication Sig Dispense Refill  . vitamin B-12 (CYANOCOBALAMIN) 100 MCG tablet Take 100 mcg by mouth daily.     No facility-administered medications prior to visit.     ROS Review of Systems  Constitutional: Negative for chills, diaphoresis, fatigue and fever.  HENT: Positive for congestion, postnasal drip, rhinorrhea and sneezing. Negative for facial swelling, sinus pain, sinus pressure, sore throat, trouble swallowing and voice change.   Eyes: Negative.   Respiratory: Negative.  Negative for cough, chest tightness, shortness of breath and wheezing.   Cardiovascular: Negative for chest pain, palpitations and leg swelling.  Gastrointestinal: Negative for abdominal pain, constipation, diarrhea, nausea and vomiting.  Endocrine: Negative.   Genitourinary: Negative.   Musculoskeletal: Negative.   Skin: Negative.  Negative for color change and rash.  Allergic/Immunologic: Negative.   Neurological: Negative.  Negative for dizziness and headaches.  Hematological: Negative for adenopathy. Does not bruise/bleed easily.  Psychiatric/Behavioral: Negative.     Objective:  BP 134/80 (BP Location: Left Arm, Patient Position: Sitting, Cuff Size: Large)   Pulse 74   Temp 98 F (36.7 C) (Oral)   Resp 16   Ht 5\' 5"  (1.651 m)   Wt 198 lb (89.8 kg)   SpO2 100%   BMI 32.95 kg/m   BP Readings from Last 3 Encounters:  10/12/16 134/80  09/27/16 118/60  03/29/16 109/65    Wt Readings from Last 3 Encounters:  10/12/16 198 lb (89.8 kg)  09/27/16 196 lb (88.9 kg)  03/29/16 200 lb (90.7 kg)    Physical Exam  Constitutional:  She is oriented to person, place, and time. No distress.  HENT:  Nose: Mucosal edema and rhinorrhea present. No epistaxis.  No foreign bodies. Right sinus exhibits no maxillary sinus tenderness and no frontal sinus tenderness. Left sinus exhibits no maxillary sinus tenderness and no frontal sinus tenderness.  Mouth/Throat: Oropharynx is clear and moist and mucous membranes are normal. Mucous membranes are not pale, not dry and not cyanotic. No oral lesions. No trismus in the jaw. No uvula swelling. No oropharyngeal exudate, posterior oropharyngeal edema, posterior oropharyngeal erythema or tonsillar abscesses.  Eyes: Pupils are equal, round, and reactive to light. EOM are normal. Right eye exhibits no chemosis, no discharge and no hordeolum. Left eye exhibits no chemosis, no discharge and no hordeolum. Right conjunctiva is injected. Right conjunctiva has no hemorrhage. Left conjunctiva is injected. Left conjunctiva has no hemorrhage. No scleral icterus.    There is very, very mild bilateral conjunctival injection.  Neck: Normal range of motion. Neck supple. No JVD present. No thyromegaly present.  Cardiovascular: Normal rate, regular rhythm and intact distal pulses.  Exam reveals no gallop and no friction rub.   No murmur heard. Pulmonary/Chest: Effort normal and breath sounds normal. No respiratory distress. She has no wheezes. She has no rales. She exhibits no tenderness.  Abdominal: Soft. Bowel sounds are normal. She exhibits no distension and no mass. There is no tenderness. There is no rebound and no guarding.  Musculoskeletal: Normal range of motion. She exhibits no edema, tenderness or deformity.  Lymphadenopathy:    She has no cervical  adenopathy.  Neurological: She is alert and oriented to person, place, and time.  Skin: Skin is warm and dry. No rash noted. She is not diaphoretic. No erythema. No pallor.  Vitals reviewed.   Lab Results  Component Value Date   WBC 7.9 02/24/2016   HGB  13.4 02/24/2016   HCT 39.3 02/24/2016   PLT 428.0 (H) 02/24/2016   GLUCOSE 89 02/24/2016   CHOL 170 06/11/2015   TRIG 39.0 06/11/2015   HDL 55.90 06/11/2015   LDLCALC 107 (H) 06/11/2015   ALT 15 09/22/2015   AST 18 09/22/2015   NA 138 02/24/2016   K 3.8 02/24/2016   CL 103 02/24/2016   CREATININE 0.80 02/24/2016   BUN 7 02/24/2016   CO2 30 02/24/2016   TSH 1.31 06/11/2015   HGBA1C 5.1 09/27/2016    Mr Brain Wo Contrast  Result Date: 10/05/2016 CLINICAL DATA:  Abnormal MRI of the head. Chiari malformation. Visual disturbance. Numbness and tingling in the upper and lower extremities bilaterally. EXAM: MRI HEAD WITHOUT CONTRAST TECHNIQUE: Multiplanar, multiecho pulse sequences of the brain and surrounding structures were obtained without intravenous contrast. COMPARISON:  MRI of the brain 03/07/2016. FINDINGS: Brain: Bifrontal subcortical T2 hyperintensities are stable. The cerebellar tonsils extend 5 mm below the foramen magnum. The tonsils are rounded. The fourth ventricle is normal. No acute infarct, hemorrhage, or mass lesion is present. The ventricles are of normal size. No significant extra-axial fluid collection is present. Vascular: Flow is present in the major intracranial arteries. Skull and upper cervical spine: The skullbase is normal. The upper cervical spine is within normal limits. Midline sagittal structures are otherwise unremarkable. Sinuses/Orbits: The paranasal sinuses and mastoid air cells are clear. The globes and orbits are within normal limits. IMPRESSION: 1. Stable appearance of bifrontal subcortical T2 hyperintensities, greater than expected for age. The finding is nonspecific but can be seen in the setting of chronic microvascular ischemia, a demyelinating process such as multiple sclerosis, vasculitis, complicated migraine headaches, or as the sequelae of a prior infectious or inflammatory process. 2. Cerebellar tonsillar ectopia without a definite Chiari malformation.  3. No other acute or focal lesion to explain the patient's visual disturbance or paresthesias. Electronically Signed   By: San Morelle M.D.   On: 10/05/2016 15:16    Assessment & Plan:   Kyra was seen today for allergic rhinitis  and conjunctivitis.  Diagnoses and all orders for this visit:  Allergic rhinitis due to pollen, unspecified seasonality -     cetirizine-pseudoephedrine (ZYRTEC-D ALLERGY & CONGESTION) 5-120 MG tablet; Take 1 tablet by mouth 2 (two) times daily.  Acute viral conjunctivitis of both eyes -     neomycin-polymyxin-hydrocortisone (CORTISPORIN) 3.5-10000-1 ophthalmic suspension; Place 3 drops into both eyes 3 (three) times daily.   I have discontinued Ms. Pettaway's vitamin B-12. I am also having her start on cetirizine-pseudoephedrine and neomycin-polymyxin-hydrocortisone.  Meds ordered this encounter  Medications  . cetirizine-pseudoephedrine (ZYRTEC-D ALLERGY & CONGESTION) 5-120 MG tablet    Sig: Take 1 tablet by mouth 2 (two) times daily.    Dispense:  60 tablet    Refill:  11  . neomycin-polymyxin-hydrocortisone (CORTISPORIN) 3.5-10000-1 ophthalmic suspension    Sig: Place 3 drops into both eyes 3 (three) times daily.    Dispense:  7.5 mL    Refill:  0     Follow-up: Return if symptoms worsen or fail to improve.  Scarlette Calico, MD

## 2016-10-28 ENCOUNTER — Ambulatory Visit: Payer: 59

## 2016-11-15 DIAGNOSIS — L816 Other disorders of diminished melanin formation: Secondary | ICD-10-CM | POA: Diagnosis not present

## 2016-11-15 DIAGNOSIS — L819 Disorder of pigmentation, unspecified: Secondary | ICD-10-CM | POA: Diagnosis not present

## 2017-01-30 DIAGNOSIS — N760 Acute vaginitis: Secondary | ICD-10-CM | POA: Diagnosis not present

## 2017-01-30 DIAGNOSIS — R350 Frequency of micturition: Secondary | ICD-10-CM | POA: Diagnosis not present

## 2017-02-03 ENCOUNTER — Other Ambulatory Visit: Payer: 59

## 2017-02-03 ENCOUNTER — Ambulatory Visit: Payer: 59 | Admitting: Family

## 2017-02-03 ENCOUNTER — Encounter: Payer: Self-pay | Admitting: Family

## 2017-02-03 VITALS — BP 110/70 | HR 84 | Temp 98.8°F | Ht 65.0 in | Wt 202.8 lb

## 2017-02-03 DIAGNOSIS — R358 Other polyuria: Secondary | ICD-10-CM

## 2017-02-03 DIAGNOSIS — R3589 Other polyuria: Secondary | ICD-10-CM

## 2017-02-03 DIAGNOSIS — H6993 Unspecified Eustachian tube disorder, bilateral: Secondary | ICD-10-CM

## 2017-02-03 DIAGNOSIS — R3129 Other microscopic hematuria: Secondary | ICD-10-CM | POA: Diagnosis not present

## 2017-02-03 DIAGNOSIS — H6983 Other specified disorders of Eustachian tube, bilateral: Secondary | ICD-10-CM

## 2017-02-03 LAB — POC URINALSYSI DIPSTICK (AUTOMATED)
Bilirubin, UA: NEGATIVE
Glucose, UA: NEGATIVE
KETONES UA: NEGATIVE
Leukocytes, UA: NEGATIVE
Nitrite, UA: NEGATIVE
PROTEIN UA: NEGATIVE
RBC UA: POSITIVE
SPEC GRAV UA: 1.025 (ref 1.010–1.025)
UROBILINOGEN UA: 0.2 U/dL
pH, UA: 6 (ref 5.0–8.0)

## 2017-02-03 MED ORDER — SULFAMETHOXAZOLE-TRIMETHOPRIM 800-160 MG PO TABS: 1.0000 | ORAL_TABLET | Freq: Two times a day (BID) | ORAL | 0 refills | Status: DC

## 2017-02-03 MED ORDER — FLUTICASONE PROPIONATE 50 MCG/ACT NA SUSP: 2.0000 | Freq: Every day | NASAL | 1 refills | Status: DC

## 2017-02-03 NOTE — Progress Notes (Signed)
Rhonda [name] is a 47 y.o. female with the following history as recorded in [employer]:  Patient Active Problem List   Diagnosis Date Noted  . Acute viral conjunctivitis of both eyes 10/12/2016  . Chiari malformation type II ([place]) 09/27/2016  . Abnormal MRI of head 03/07/2016  . Elevated blood sugar level 02/24/2016  . Visual disturbance 02/24/2016  . Gallstones and inflammation of gallbladder without obstruction 08/28/2015  . Gastroesophageal reflux disease without esophagitis 08/19/2015  . IBS (irritable bowel syndrome) 08/19/2015  . Numbness and tingling of upper and lower extremities of both sides 04/14/2015  . Routine general medical examination at a health care facility 07/14/2014  . Vitamin B 12 deficiency 12/08/2008  . CARPAL TUNNEL SYNDROME 05/14/2007  . DE QUERVAIN'S TENOSYNOVITIS 05/14/2007  . Allergic rhinitis 01/31/2007    Current Outpatient Medications  Medication Sig Dispense Refill  . fluticasone (FLONASE) 50 MCG/ACT nasal spray Place 2 sprays into both nostrils daily. 16 g 1  . sulfamethoxazole-trimethoprim (BACTRIM DS,SEPTRA DS) 800-160 MG tablet Take 1 tablet by mouth 2 (two) times daily. 14 tablet 0   No current facility-administered medications for this visit.     Allergies: Cephalosporins; Oseltamivir phosphate; Amoxicillin; and Doxycycline  Past Medical History:  Diagnosis Date  . Allergic rhinitis   . Allergy   . B12 deficiency   . Carpal tunnel syndrome   . Dysfunction of eustachian tube     Past Surgical History:  Procedure Laterality Date  . BUNIONECTOMY     right   . CHOLECYSTECTOMY N/A 09/25/2015   Procedure: LAPAROSCOPIC CHOLECYSTECTOMY WITH INTRAOPERATIVE CHOLANGIOGRAM;  Surgeon: Alphonsa Overall, MD;  Location: [facility];  Service: General;  Laterality: N/A;  . TONSILLECTOMY      Family History  Problem Relation Age of Onset  . Heart disease Father   . Hypertension Father   . Diabetes Father   . Heart disease Maternal Grandmother   . Headache  Neg Hx     Social History   Tobacco Use  . Smoking status: Never Smoker  . Smokeless tobacco: Never Used  Substance Use Topics  . Alcohol use: No    Subjective:  Patient had severe pain x 1 episode with urination on Saturday- notes that pain was so severe it caught her breath; pain quickly passed but left with sensation of pain in abdomen/ umbilical region;  Went to GYN on Monday- normal pelvic exam- no vaginal infections, no dropped bladder; U/A normal but no culture done; was given Myrbetriq but opted not start; continuing with sensation of urinary frequency as the week has progressed; has not seen blood in urine; no flank pain/ no history of kidney stones; bowel movements essentially normal;     Objective:  Vitals:   02/03/17 1315  BP: 110/70  Pulse: 84  Temp: 98.8 F (37.1 C)  TempSrc: Oral  SpO2: 98%  Weight: 202 lb 12 oz (92 kg)  Height: 5\' 5"  (1.651 m)    General: Well developed, well nourished, in no acute distress  Skin : Warm and dry.  Head: Normocephalic and atraumatic  Lungs: Respirations unlabored; clear to auscultation bilaterally without wheeze, rales, rhonchi  CVS exam: normal rate and regular rhythm.  Abdomen: Soft; nontender; nondistended; normoactive bowel sounds; no masses or hepatosplenomegaly  Neurologic: Alert and oriented; speech intact; face symmetrical; moves all extremities well; CNII-XII intact without focal deficit  Assessment:  1. Polyuria   2. Hematuria, microscopic   3. Dysfunction of both eustachian tubes     Plan:  1. & 2. ? If patient recently passed a kidney stone with description of severe episode of pain with one episode of urinating; will check urine culture today; start patient on Bactrim DS bid x 7 days; follow-up to be determined after culture results obtained- may need imaging; 3. Also recommended that patient re-start her Flonase due to marked congestion in her ears; recommend follow-up with ENT if symptoms persist.   No Follow-up  on file.  Orders Placed This Encounter  Procedures  . CULTURE, URINE COMPREHENSIVE    Standing Status:   Future    Number of Occurrences:   1    Standing Expiration Date:   02/03/2018  . POCT Urinalysis Dipstick (Automated)    Requested Prescriptions   Signed Prescriptions Disp Refills  . sulfamethoxazole-trimethoprim (BACTRIM DS,SEPTRA DS) 800-160 MG tablet 14 tablet 0    Sig: Take 1 tablet by mouth 2 (two) times daily.  . fluticasone (FLONASE) 50 MCG/ACT nasal spray 16 g 1    Sig: Place 2 sprays into both nostrils daily.

## 2017-02-05 LAB — CULTURE, URINE COMPREHENSIVE
MICRO NUMBER:: 90046778
SPECIMEN QUALITY: ADEQUATE

## 2017-02-06 ENCOUNTER — Other Ambulatory Visit: Payer: Self-pay | Admitting: Internal Medicine

## 2017-02-06 ENCOUNTER — Other Ambulatory Visit: Payer: Self-pay | Admitting: Family

## 2017-02-06 ENCOUNTER — Telehealth: Payer: Self-pay | Admitting: Internal Medicine

## 2017-02-06 DIAGNOSIS — R350 Frequency of micturition: Secondary | ICD-10-CM

## 2017-02-06 DIAGNOSIS — R3129 Other microscopic hematuria: Secondary | ICD-10-CM

## 2017-02-06 NOTE — Telephone Encounter (Signed)
Pt had a reaction to the Bactrim. Do you want to change abx rx?

## 2017-02-06 NOTE — Telephone Encounter (Signed)
Confirmed with patient that she did stop taking bactrim the day of reaction and has not taken any more, her allergic reaction is better and cleared up---she will not take any more, advised of laura's note/instructions

## 2017-02-06 NOTE — Telephone Encounter (Signed)
Copied from [place]. Topic: Quick Communication - See Telephone Encounter >> Feb 06, 2017  8:59 AM Bea Graff, NT wrote: CRM for notification. See Telephone encounter for: Pt states that on Saturday when she took the  sulfamethoxazole-trimethoprim (BACTRIM DS,SEPTRA DS) she got blisters. Asked pt if she was still having symptoms and she stated no. She stopped taking the medication and states now she is feeling better from the allergic reaction. She wanted to see if the doctor wanted to prescribe something else or what the next step should be.   02/06/17.

## 2017-02-06 NOTE — Telephone Encounter (Signed)
Please make sure she understands to stop the Bactrim; will add this an allergy; since the culture was clear, I am not going to start another antibiotic; let's get the ultrasound done and then re-evaluate;  I would like her to see her ENT about her ears;

## 2017-02-06 NOTE — Telephone Encounter (Signed)
Message was to go to [place].

## 2017-02-09 DIAGNOSIS — H6691 Otitis media, unspecified, right ear: Secondary | ICD-10-CM | POA: Diagnosis not present

## 2017-02-09 DIAGNOSIS — H6521 Chronic serous otitis media, right ear: Secondary | ICD-10-CM | POA: Diagnosis not present

## 2017-02-09 DIAGNOSIS — J320 Chronic maxillary sinusitis: Secondary | ICD-10-CM | POA: Diagnosis not present

## 2017-02-17 ENCOUNTER — Ambulatory Visit
Admission: RE | Admit: 2017-02-17 | Discharge: 2017-02-17 | Disposition: A | Discharge status: Home / Self Care | Payer: 59 | Source: Ambulatory Visit | Attending: Family | Admitting: Family

## 2017-02-17 DIAGNOSIS — R3129 Other microscopic hematuria: Secondary | ICD-10-CM

## 2017-02-17 DIAGNOSIS — R350 Frequency of micturition: Secondary | ICD-10-CM

## 2017-02-21 DIAGNOSIS — N83202 Unspecified ovarian cyst, left side: Secondary | ICD-10-CM | POA: Diagnosis not present

## 2017-02-21 DIAGNOSIS — J322 Chronic ethmoidal sinusitis: Secondary | ICD-10-CM | POA: Diagnosis not present

## 2017-02-21 DIAGNOSIS — J320 Chronic maxillary sinusitis: Secondary | ICD-10-CM | POA: Diagnosis not present

## 2017-03-22 ENCOUNTER — Other Ambulatory Visit (INDEPENDENT_AMBULATORY_CARE_PROVIDER_SITE_OTHER): Payer: 59

## 2017-03-22 ENCOUNTER — Ambulatory Visit: Payer: 59 | Admitting: Family

## 2017-03-22 ENCOUNTER — Encounter: Payer: Self-pay | Admitting: Family

## 2017-03-22 VITALS — BP 118/78 | HR 84 | Temp 98.5°F | Ht 65.0 in | Wt 200.0 lb

## 2017-03-22 DIAGNOSIS — E538 Deficiency of other specified B group vitamins: Secondary | ICD-10-CM | POA: Diagnosis not present

## 2017-03-22 DIAGNOSIS — R5383 Other fatigue: Secondary | ICD-10-CM

## 2017-03-22 DIAGNOSIS — Z113 Encounter for screening for infections with a predominantly sexual mode of transmission: Secondary | ICD-10-CM

## 2017-03-22 LAB — COMPREHENSIVE METABOLIC PANEL
ALBUMIN: 3.7 g/dL (ref 3.5–5.2)
ALT: 12 U/L (ref 0–35)
AST: 11 U/L (ref 0–37)
Alkaline Phosphatase: 74 U/L (ref 39–117)
BILIRUBIN TOTAL: 1 mg/dL (ref 0.2–1.2)
BUN: 8 mg/dL (ref 6–23)
CO2: 25 mEq/L (ref 19–32)
CREATININE: 0.86 mg/dL (ref 0.40–1.20)
Calcium: 9.6 mg/dL (ref 8.4–10.5)
Chloride: 103 mEq/L (ref 96–112)
GFR: 90.76 mL/min (ref >60.00)
Glucose, Bld: 99 mg/dL (ref 70–99)
Potassium: 4 mEq/L (ref 3.5–5.1)
SODIUM: 138 meq/L (ref 135–145)
TOTAL PROTEIN: 7.3 g/dL (ref 6.0–8.3)

## 2017-03-22 LAB — CBC WITH DIFFERENTIAL/PLATELET
BASOS PCT: 1 % (ref 0.0–3.0)
Basophils Absolute: 0.1 10*3/uL (ref 0.0–0.1)
EOS ABS: 0.1 10*3/uL (ref 0.0–0.7)
Eosinophils Relative: 0.8 % (ref 0.0–5.0)
HCT: 40.3 % (ref 36.0–46.0)
HEMOGLOBIN: 13.9 g/dL (ref 12.0–15.0)
Lymphocytes Relative: 27.6 % (ref 12.0–46.0)
Lymphs Abs: 1.7 10*3/uL (ref 0.7–4.0)
MCHC: 34.5 g/dL (ref 30.0–36.0)
MCV: 89.2 fl (ref 78.0–100.0)
MONO ABS: 0.4 10*3/uL (ref 0.1–1.0)
Monocytes Relative: 6.6 % (ref 3.0–12.0)
NEUTROS ABS: 3.9 10*3/uL (ref 1.4–7.7)
Neutrophils Relative %: 64 % (ref 43.0–77.0)
PLATELETS: 447 10*3/uL — AB (ref 150.0–400.0)
RBC: 4.51 Mil/uL (ref 3.87–5.11)
RDW: 13.8 % (ref 11.5–15.5)
WBC: 6 10*3/uL (ref 4.0–10.5)

## 2017-03-22 LAB — TSH: TSH: 0.9 u[IU]/mL (ref 0.35–4.50)

## 2017-03-22 LAB — VITAMIN B12: Vitamin B-12: 214 pg/mL (ref 211–911)

## 2017-03-22 MED ORDER — CYANOCOBALAMIN 1000 MCG/ML IJ SOLN
1000.0000 ug | Freq: Once | INTRAMUSCULAR | Status: AC
  Administered 2017-03-22: 1000 ug via INTRAMUSCULAR

## 2017-03-22 NOTE — Progress Notes (Signed)
Rhonda [name] is a 47 y.o. female with the following history as recorded in EpicCare:  Patient Active Problem List   Diagnosis Date Noted  . Acute viral conjunctivitis of both eyes 10/12/2016  . Chiari malformation type II ([place]) 09/27/2016  . Abnormal MRI of head 03/07/2016  . Elevated blood sugar level 02/24/2016  . Visual disturbance 02/24/2016  . Gallstones and inflammation of gallbladder without obstruction 08/28/2015  . Gastroesophageal reflux disease without esophagitis 08/19/2015  . IBS (irritable bowel syndrome) 08/19/2015  . Numbness and tingling of upper and lower extremities of both sides 04/14/2015  . Routine general medical examination at a health care facility 07/14/2014  . Vitamin B 12 deficiency 12/08/2008  . CARPAL TUNNEL SYNDROME 05/14/2007  . DE QUERVAIN'S TENOSYNOVITIS 05/14/2007  . Allergic rhinitis 01/31/2007    Current Outpatient Medications  Medication Sig Dispense Refill  . fluticasone (FLONASE) 50 MCG/ACT nasal spray Place 2 sprays into both nostrils daily. 16 g 1   No current facility-administered medications for this visit.     Allergies: Bactrim ds [sulfamethoxazole-trimethoprim]; Cephalosporins; Oseltamivir phosphate; Amoxicillin; and Doxycycline  Past Medical History:  Diagnosis Date  . Allergic rhinitis   . Allergy   . B12 deficiency   . Carpal tunnel syndrome   . Dysfunction of eustachian tube     Past Surgical History:  Procedure Laterality Date  . BUNIONECTOMY     right   . CHOLECYSTECTOMY N/A 09/25/2015   Procedure: LAPAROSCOPIC CHOLECYSTECTOMY WITH INTRAOPERATIVE CHOLANGIOGRAM;  Surgeon: Alphonsa Overall, MD;  Location: [facility];  Service: General;  Laterality: N/A;  . TONSILLECTOMY      Family History  Problem Relation Age of Onset  . Heart disease Father   . Hypertension Father   . Diabetes Father   . Heart disease Maternal Grandmother   . Headache Neg Hx     Social History   Tobacco Use  . Smoking status: Never Smoker  .  Smokeless tobacco: Never Used  Substance Use Topics  . Alcohol use: No    Subjective:  Patient presents with sensation of "being more tired" in the past week; does have history of B12 deficiency- has not had a shot in the past year; has tried OTC B12 supplements and is concerned that may need to re-start shots; feels that appetite has been normal/ sleeping well; denies any concerns for UTI; of note, GYN started patient on Junel in the past month- took 3 weeks and had to stop due to side effects; also in the past month, had to see ENT, due to persisting sinus infection- took Azithromycin and symptoms persisting; admits that her anxiety has been higher recently- in a new relationship and concerned about some of his underlying health issues;    Objective:  Vitals:   03/22/17 0810  BP: 118/78  Pulse: 84  Temp: 98.5 F (36.9 C)  TempSrc: Oral  SpO2: 99%  Weight: 200 lb (90.7 kg)  Height: '5\' 5"'$  (1.651 m)    General: Well developed, well nourished, in no acute distress  Skin : Warm and dry.  Head: Normocephalic and atraumatic  Eyes: Sclera and conjunctiva clear; pupils round and reactive to light; extraocular movements intact  Ears: External normal; canals clear; tympanic membranes normal  Oropharynx: Pink, supple. No suspicious lesions  Neck: Supple without thyromegaly, adenopathy  Lungs: Respirations unlabored; clear to auscultation bilaterally without wheeze, rales, rhonchi  CVS exam: normal rate and regular rhythm.  Neurologic: Alert and oriented; speech intact; face symmetrical; moves all extremities well;  CNII-XII intact without focal deficit  Assessment:  1. Other fatigue   2. B12 deficiency   3. Screen for STD (sexually transmitted disease)     Plan:  Suspect symptoms are mostly due to side effect/ intolerance of recent trial of OCPs; she has planned follow-up with her GYN next week to discuss alternatives to help with ovarian cyst; will check B12 level today and go ahead and  give her a B12 injection; she has had issues with B12 deficiency for the past 9+ years; may need to re-start monthly injections; update STD screen per patient request due to being in new relationship; follow-up to be determined;  Time spent with patient 30 minutes- greater than 50%  Spent in counseling.   No Follow-up on file.  Orders Placed This Encounter  Procedures  . Urine Culture    Standing Status:   Future    Number of Occurrences:   1    Standing Expiration Date:   03/22/2018  . CBC w/Diff    Standing Status:   Future    Number of Occurrences:   1    Standing Expiration Date:   03/22/2018  . Comp Met (CMET)    Standing Status:   Future    Number of Occurrences:   1    Standing Expiration Date:   03/22/2018  . TSH    Standing Status:   Future    Number of Occurrences:   1    Standing Expiration Date:   03/22/2018  . B12    Standing Status:   Future    Number of Occurrences:   1    Standing Expiration Date:   03/22/2018  . HIV antibody    Standing Status:   Future    Number of Occurrences:   1    Standing Expiration Date:   03/22/2018  . RPR    Standing Status:   Future    Number of Occurrences:   1    Standing Expiration Date:   03/22/2018  . GC Probe amplification, urine    [facility] result please    Standing Status:   Future    Standing Expiration Date:   03/22/2018    Requested Prescriptions    No prescriptions requested or ordered in this encounter

## 2017-03-23 ENCOUNTER — Telehealth: Payer: Self-pay

## 2017-03-23 LAB — URINE CULTURE
MICRO NUMBER:: 90256314
Result:: NO GROWTH
SPECIMEN QUALITY: ADEQUATE

## 2017-03-23 LAB — RPR: RPR: NONREACTIVE

## 2017-03-23 LAB — HIV ANTIBODY (ROUTINE TESTING W REFLEX): HIV 1&2 Ab, 4th Generation: NONREACTIVE

## 2017-03-28 ENCOUNTER — Ambulatory Visit (INDEPENDENT_AMBULATORY_CARE_PROVIDER_SITE_OTHER): Payer: 59

## 2017-03-28 DIAGNOSIS — Z01419 Encounter for gynecological examination (general) (routine) without abnormal findings: Secondary | ICD-10-CM | POA: Diagnosis not present

## 2017-03-28 DIAGNOSIS — E538 Deficiency of other specified B group vitamins: Secondary | ICD-10-CM

## 2017-03-28 DIAGNOSIS — Z6832 Body mass index (BMI) 32.0-32.9, adult: Secondary | ICD-10-CM | POA: Diagnosis not present

## 2017-03-28 MED ORDER — CYANOCOBALAMIN 1000 MCG/ML IJ SOLN
1000.0000 ug | Freq: Once | INTRAMUSCULAR | Status: AC
  Administered 2017-03-28: 1000 ug via INTRAMUSCULAR

## 2017-04-03 ENCOUNTER — Ambulatory Visit (INDEPENDENT_AMBULATORY_CARE_PROVIDER_SITE_OTHER): Payer: 59

## 2017-04-03 DIAGNOSIS — E538 Deficiency of other specified B group vitamins: Secondary | ICD-10-CM | POA: Diagnosis not present

## 2017-04-03 DIAGNOSIS — N83202 Unspecified ovarian cyst, left side: Secondary | ICD-10-CM | POA: Diagnosis not present

## 2017-04-03 DIAGNOSIS — D259 Leiomyoma of uterus, unspecified: Secondary | ICD-10-CM | POA: Diagnosis not present

## 2017-04-03 MED ORDER — CYANOCOBALAMIN 1000 MCG/ML IJ SOLN
1000.0000 ug | Freq: Once | INTRAMUSCULAR | Status: AC
  Administered 2017-04-03: 1000 ug via INTRAMUSCULAR

## 2017-04-04 ENCOUNTER — Ambulatory Visit: Payer: 59

## 2017-04-04 ENCOUNTER — Ambulatory Visit: Payer: 59 | Admitting: Neurology

## 2017-05-01 DIAGNOSIS — K649 Unspecified hemorrhoids: Secondary | ICD-10-CM | POA: Diagnosis not present

## 2017-05-04 ENCOUNTER — Ambulatory Visit (INDEPENDENT_AMBULATORY_CARE_PROVIDER_SITE_OTHER): Payer: 59 | Admitting: *Deleted

## 2017-05-04 DIAGNOSIS — E538 Deficiency of other specified B group vitamins: Secondary | ICD-10-CM

## 2017-05-04 MED ORDER — CYANOCOBALAMIN 1000 MCG/ML IJ SOLN
1000.0000 ug | Freq: Once | INTRAMUSCULAR | Status: AC
  Administered 2017-05-04: 1000 ug via INTRAMUSCULAR

## 2017-05-04 NOTE — Progress Notes (Signed)
I have reviewed and agree.

## 2017-05-30 ENCOUNTER — Encounter: Payer: Self-pay | Admitting: Family

## 2017-05-30 ENCOUNTER — Ambulatory Visit: Payer: 59 | Admitting: Family

## 2017-05-30 ENCOUNTER — Other Ambulatory Visit (INDEPENDENT_AMBULATORY_CARE_PROVIDER_SITE_OTHER): Payer: 59

## 2017-05-30 VITALS — BP 112/80 | HR 94 | Temp 98.5°F | Ht 65.0 in | Wt 199.1 lb

## 2017-05-30 DIAGNOSIS — J329 Chronic sinusitis, unspecified: Secondary | ICD-10-CM | POA: Diagnosis not present

## 2017-05-30 DIAGNOSIS — E538 Deficiency of other specified B group vitamins: Secondary | ICD-10-CM

## 2017-05-30 LAB — VITAMIN B12: VITAMIN B 12: 182 pg/mL — AB (ref 211–911)

## 2017-05-30 MED ORDER — AZITHROMYCIN 250 MG PO TABS: ORAL_TABLET | ORAL | 0 refills | Status: DC

## 2017-05-30 MED ORDER — PREDNISONE 20 MG PO TABS: 20.0000 mg | ORAL_TABLET | Freq: Every day | ORAL | 0 refills | Status: DC

## 2017-05-30 MED ORDER — CYANOCOBALAMIN 1000 MCG/ML IJ SOLN
1000.0000 ug | Freq: Once | INTRAMUSCULAR | Status: AC
  Administered 2017-05-30: 1000 ug via INTRAMUSCULAR

## 2017-05-30 NOTE — Progress Notes (Signed)
Rhonda [name] is a 47 y.o. female with the following history as recorded in EpicCare:  Patient Active Problem List   Diagnosis Date Noted  . Acute viral conjunctivitis of both eyes 10/12/2016  . Chiari malformation type II ([place]) 09/27/2016  . Abnormal MRI of head 03/07/2016  . Elevated blood sugar level 02/24/2016  . Visual disturbance 02/24/2016  . Gallstones and inflammation of gallbladder without obstruction 08/28/2015  . Gastroesophageal reflux disease without esophagitis 08/19/2015  . IBS (irritable bowel syndrome) 08/19/2015  . Numbness and tingling of upper and lower extremities of both sides 04/14/2015  . Routine general medical examination at a health care facility 07/14/2014  . Vitamin B 12 deficiency 12/08/2008  . CARPAL TUNNEL SYNDROME 05/14/2007  . DE QUERVAIN'S TENOSYNOVITIS 05/14/2007  . Allergic rhinitis 01/31/2007    Current Outpatient Medications  Medication Sig Dispense Refill  . fluticasone (FLONASE) 50 MCG/ACT nasal spray Place 2 sprays into both nostrils daily. 16 g 1  . LO LOESTRIN FE 1 MG-10 MCG / 10 MCG tablet TK 1 T PO D UTD  12  . nitrofurantoin, macrocrystal-monohydrate, (MACROBID) 100 MG capsule TK 1 C PO BID UNTIL GONE  0  . terconazole (TERAZOL 3) 0.8 % vaginal cream I 1 APL INTRAVAGINALLY QHS  0  . azithromycin (ZITHROMAX) 250 MG tablet 2 tabs po qd x 1 day; 1 tablet per day x 4 days; 6 tablet 0  . predniSONE (DELTASONE) 20 MG tablet Take 1 tablet (20 mg total) by mouth daily with breakfast. 5 tablet 0   No current facility-administered medications for this visit.     Allergies: Bactrim ds [sulfamethoxazole-trimethoprim]; Cephalosporins; Oseltamivir phosphate; Amoxicillin; and Doxycycline  Past Medical History:  Diagnosis Date  . Allergic rhinitis   . Allergy   . B12 deficiency   . Carpal tunnel syndrome   . Dysfunction of eustachian tube     Past Surgical History:  Procedure Laterality Date  . BUNIONECTOMY     right   . CHOLECYSTECTOMY  N/A 09/25/2015   Procedure: LAPAROSCOPIC CHOLECYSTECTOMY WITH INTRAOPERATIVE CHOLANGIOGRAM;  Surgeon: Alphonsa Overall, MD;  Location: [facility];  Service: General;  Laterality: N/A;  . TONSILLECTOMY      Family History  Problem Relation Age of Onset  . Heart disease Father   . Hypertension Father   . Diabetes Father   . Heart disease Maternal Grandmother   . Headache Neg Hx     Social History   Tobacco Use  . Smoking status: Never Smoker  . Smokeless tobacco: Never Used  Substance Use Topics  . Alcohol use: No    Subjective:  Patient presents with concerns for recurrent right ear pain x 1 week; chronic sinus issues; going on vacation next week and concerned about upcoming flights; has been on Flonase with regularity;   Needs to get B12 level checked today;   Objective:  Vitals:   05/30/17 1335  BP: 112/80  Pulse: 94  Temp: 98.5 F (36.9 C)  TempSrc: Oral  SpO2: 98%  Weight: 199 lb 1.3 oz (90.3 kg)  Height: 5\' 5"  (1.651 m)    General: Well developed, well nourished, in no acute distress  Skin : Warm and dry.  Head: Normocephalic and atraumatic  Eyes: Sclera and conjunctiva clear; pupils round and reactive to light; extraocular movements intact  Ears: External normal; canals clear; tympanic membranes erythematous Oropharynx: Pink, supple. No suspicious lesions  Neck: Supple without thyromegaly, adenopathy  Lungs: Respirations unlabored; clear to auscultation bilaterally without wheeze, rales,  rhonchi  CVS exam: normal rate and regular rhythm.  Abdomen: Soft; nontender; nondistended; normoactive bowel sounds; no masses or hepatosplenomegaly  Musculoskeletal: No deformities; no active joint inflammation  Extremities: No edema, cyanosis, clubbing  Vessels: Symmetric bilaterally  Neurologic: Alert and oriented; speech intact; face symmetrical; moves all extremities well; CNII-XII intact without focal deficit  Assessment:  1. Recurrent sinus infections   2. B12 deficiency      Plan:  1. Rx for Z-pak #1 take as directed; Rx for Prednisone 20 mg qd x 5 days; will refer to allergist for further evaluation; per patient, ENT did not feel she needed surgery; have asked her to get that last OV from ENT sent here for review. 2. Check B12 level today; update B12 today;   No follow-ups on file.  Orders Placed This Encounter  Procedures  . B12    Standing Status:   Future    Number of Occurrences:   1    Standing Expiration Date:   05/30/2018  . Ambulatory referral to Allergy    Referral Priority:   Routine    Referral Type:   Allergy Testing    Referral Reason:   Specialty Services Required    Requested Specialty:   Allergy    Number of Visits Requested:   1    Requested Prescriptions   Signed Prescriptions Disp Refills  . azithromycin (ZITHROMAX) 250 MG tablet 6 tablet 0    Sig: 2 tabs po qd x 1 day; 1 tablet per day x 4 days;  . predniSONE (DELTASONE) 20 MG tablet 5 tablet 0    Sig: Take 1 tablet (20 mg total) by mouth daily with breakfast.

## 2017-06-05 ENCOUNTER — Telehealth: Payer: Self-pay | Admitting: Family

## 2017-06-05 ENCOUNTER — Ambulatory Visit (INDEPENDENT_AMBULATORY_CARE_PROVIDER_SITE_OTHER): Payer: 59

## 2017-06-05 DIAGNOSIS — E538 Deficiency of other specified B group vitamins: Secondary | ICD-10-CM

## 2017-06-05 MED ORDER — LEVOFLOXACIN 500 MG PO TABS: 500.0000 mg | ORAL_TABLET | Freq: Every day | ORAL | 0 refills | Status: DC

## 2017-06-05 MED ORDER — CYANOCOBALAMIN 1000 MCG/ML IJ SOLN
1000.0000 ug | Freq: Once | INTRAMUSCULAR | Status: AC
  Administered 2017-06-05: 1000 ug via INTRAMUSCULAR

## 2017-06-05 NOTE — Addendum Note (Signed)
Addended by: Sherlene Shams on: 06/05/2017 04:16 PM   Modules accepted: Orders

## 2017-06-05 NOTE — Telephone Encounter (Signed)
I will call in a different antibiotic for her; I still think seeing an allergist is a good thing for her to do. Has she been contacted about that referral? We also need to see the last OV note from her ENT as we discussed- I still think she might benefit from tubes due to the frequency of these symptoms.

## 2017-06-05 NOTE — Telephone Encounter (Signed)
Patient has came in today for a nurse visit.  Patient states that she last seen Mickel Baas for a sinus infection and was prescribed an antibiotic.  Patient states that she has not had any improvement on the pain she is having in her ears.  States they have not gotten worse but has not gotten any better.  Patient would like to know if there is anything else she can take for the ear pain or if there is an ear drop?

## 2017-06-06 MED ORDER — NEOMYCIN-POLYMYXIN-HC 3.5-10000-1 OT SOLN: 3.0000 [drp] | Freq: Four times a day (QID) | OTIC | 0 refills | Status: DC

## 2017-06-06 NOTE — Telephone Encounter (Signed)
(  FYI) Spoke with Keenan Bachelor today and he is going to fax over note from her [place] from 02/21/17. Dr Ernesto Rutherford  apparently did recommend surgery on her R ear. (He is with [facility] now but they are not on Epic).

## 2017-06-06 NOTE — Addendum Note (Signed)
Addended by: Sherlene Shams on: 06/06/2017 09:38 AM   Modules accepted: Orders

## 2017-06-06 NOTE — Telephone Encounter (Signed)
Debrox won't help with pain- its best for helping to soften ear wax; I can call in drops but doubt they will offer much benefit; I think her symptoms are more inner ear related which is why I want to see what her ENT is recommending from last visit. I just need copy of last OV with Dr. Ernesto Rutherford.

## 2017-06-06 NOTE — Telephone Encounter (Signed)
Spoke with patient and info given today. Will try and contact Dr. Jeneen Rinks [place]'s office for notes.

## 2017-06-06 NOTE — Telephone Encounter (Signed)
I spoke with patient today and info given. She has been called about her allergy appointment. She has been informed of new script. She asked about OTC eardrops so I told her you typically recommend Debrox. I will reach out to Dr. Berle Mull office for notes. Did you need all her notes or something specific?

## 2017-06-07 ENCOUNTER — Ambulatory Visit: Payer: 59

## 2017-06-07 ENCOUNTER — Telehealth: Payer: Self-pay | Admitting: Family

## 2017-06-07 NOTE — Telephone Encounter (Signed)
Please let her know that we did get ENT notes from Dr. Ernesto Rutherford; it looks like he wanted her to see an allergist as well so definitely keep that follow-up. He also mentioned that she was coming back in to do a bone scan over her right mastoid. He specifically said he didn't think a tube would help but might need to consider surgery to clean up "debris" causing her symptoms. I know it's expensive for her to see him but I think it would be good for her to schedule another follow-up there.

## 2017-06-07 NOTE — Telephone Encounter (Signed)
Spoke with patient today and info given. She will keep apt with allergist and schedule apt with Dr. Ernesto Rutherford when she gets back from her cruise.

## 2017-06-08 ENCOUNTER — Telehealth: Payer: Self-pay | Admitting: Family

## 2017-06-08 MED ORDER — FLUCONAZOLE 150 MG PO TABS: 150.0000 mg | ORAL_TABLET | Freq: Once | ORAL | 0 refills | Status: AC

## 2017-06-08 NOTE — Telephone Encounter (Signed)
Pt has been informed.

## 2017-06-08 NOTE — Telephone Encounter (Signed)
Okay to rx diflucan?

## 2017-06-08 NOTE — Telephone Encounter (Signed)
Copied from [place] [id number]. Topic: Quick Communication - See Telephone Encounter >> Jun 08, 2017 10:21 AM Aurelio Brash B wrote: CRM for notification. See Telephone encounter for: 06/08/17. PT is asking if she can get  rx for diflucan  for yeast infection  she has acquired since taking antibiotic    [facility] [phone] - [place], [phone] (Phone) [phone] (Fax)

## 2017-06-08 NOTE — Telephone Encounter (Signed)
Sent Rx in for patient.

## 2017-06-20 ENCOUNTER — Ambulatory Visit (INDEPENDENT_AMBULATORY_CARE_PROVIDER_SITE_OTHER): Payer: 59 | Admitting: *Deleted

## 2017-06-20 DIAGNOSIS — E538 Deficiency of other specified B group vitamins: Secondary | ICD-10-CM | POA: Diagnosis not present

## 2017-06-20 MED ORDER — CYANOCOBALAMIN 1000 MCG/ML IJ SOLN
1000.0000 ug | Freq: Once | INTRAMUSCULAR | Status: AC
  Administered 2017-06-20: 1000 ug via INTRAMUSCULAR

## 2017-06-20 NOTE — Progress Notes (Signed)
I have reviewed and agree.

## 2017-06-26 DIAGNOSIS — J301 Allergic rhinitis due to pollen: Secondary | ICD-10-CM | POA: Diagnosis not present

## 2017-06-26 DIAGNOSIS — J3089 Other allergic rhinitis: Secondary | ICD-10-CM | POA: Diagnosis not present

## 2017-06-26 DIAGNOSIS — J3081 Allergic rhinitis due to animal (cat) (dog) hair and dander: Secondary | ICD-10-CM | POA: Diagnosis not present

## 2017-06-27 ENCOUNTER — Other Ambulatory Visit: Payer: Self-pay | Admitting: Family

## 2017-06-27 ENCOUNTER — Telehealth: Payer: Self-pay

## 2017-06-27 ENCOUNTER — Ambulatory Visit (INDEPENDENT_AMBULATORY_CARE_PROVIDER_SITE_OTHER): Payer: 59

## 2017-06-27 DIAGNOSIS — E538 Deficiency of other specified B group vitamins: Secondary | ICD-10-CM | POA: Diagnosis not present

## 2017-06-27 MED ORDER — CYANOCOBALAMIN 1000 MCG/ML IJ SOLN
1000.0000 ug | Freq: Once | INTRAMUSCULAR | Status: AC
  Administered 2017-06-27: 1000 ug via INTRAMUSCULAR

## 2017-06-27 NOTE — Telephone Encounter (Signed)
Yes, that is fine; will put in lab order for her to come get at her convenience next week to re-check B12.

## 2017-06-27 NOTE — Telephone Encounter (Signed)
Patient has received her 4th weekly b12 injection today (nurse visit)--per laura's previous lab note, patient needs to recheck b12 lab value after 4th injection---patient advised to come back in one week to have lab drawn, then we will call her with further instructions---routing to laura, are you ok with entering b12 lab value for patient to come in and do in about one week---please advise, thanks

## 2017-06-27 NOTE — Telephone Encounter (Signed)
Patient advised, can talk with Hadeel Hillebrand,RN at [place] office if any further questions

## 2017-06-27 NOTE — Progress Notes (Signed)
I have reviewed and agree.

## 2017-06-30 NOTE — Telephone Encounter (Signed)
error 

## 2017-07-12 ENCOUNTER — Other Ambulatory Visit (INDEPENDENT_AMBULATORY_CARE_PROVIDER_SITE_OTHER): Payer: 59

## 2017-07-12 DIAGNOSIS — E538 Deficiency of other specified B group vitamins: Secondary | ICD-10-CM

## 2017-07-12 LAB — VITAMIN B12: VITAMIN B 12: 201 pg/mL — AB (ref 211–911)

## 2017-07-14 ENCOUNTER — Ambulatory Visit (INDEPENDENT_AMBULATORY_CARE_PROVIDER_SITE_OTHER): Payer: 59

## 2017-07-14 DIAGNOSIS — E538 Deficiency of other specified B group vitamins: Secondary | ICD-10-CM

## 2017-07-14 MED ORDER — CYANOCOBALAMIN 1000 MCG/ML IJ SOLN
1000.0000 ug | Freq: Once | INTRAMUSCULAR | Status: AC
  Administered 2017-07-14: 1000 ug via INTRAMUSCULAR

## 2017-07-15 NOTE — Progress Notes (Signed)
I have reviewed and agree.

## 2017-07-21 ENCOUNTER — Ambulatory Visit (INDEPENDENT_AMBULATORY_CARE_PROVIDER_SITE_OTHER): Payer: 59

## 2017-07-21 DIAGNOSIS — E538 Deficiency of other specified B group vitamins: Secondary | ICD-10-CM

## 2017-07-21 MED ORDER — CYANOCOBALAMIN 1000 MCG/ML IJ SOLN
1000.0000 ug | Freq: Once | INTRAMUSCULAR | Status: AC
  Administered 2017-07-21: 1000 ug via INTRAMUSCULAR

## 2017-07-21 NOTE — Progress Notes (Signed)
I have reviewed and agree.

## 2017-07-28 ENCOUNTER — Ambulatory Visit (INDEPENDENT_AMBULATORY_CARE_PROVIDER_SITE_OTHER): Payer: 59

## 2017-07-28 DIAGNOSIS — E538 Deficiency of other specified B group vitamins: Secondary | ICD-10-CM | POA: Diagnosis not present

## 2017-07-28 MED ORDER — CYANOCOBALAMIN 1000 MCG/ML IJ SOLN
1000.0000 ug | Freq: Once | INTRAMUSCULAR | Status: AC
  Administered 2017-07-28: 1000 ug via INTRAMUSCULAR

## 2017-08-04 ENCOUNTER — Ambulatory Visit (INDEPENDENT_AMBULATORY_CARE_PROVIDER_SITE_OTHER): Payer: 59

## 2017-08-04 DIAGNOSIS — E538 Deficiency of other specified B group vitamins: Secondary | ICD-10-CM | POA: Diagnosis not present

## 2017-08-04 MED ORDER — CYANOCOBALAMIN 1000 MCG/ML IJ SOLN
1000.0000 ug | Freq: Once | INTRAMUSCULAR | Status: AC
  Administered 2017-08-04: 1000 ug via INTRAMUSCULAR

## 2017-08-04 NOTE — Progress Notes (Signed)
I have reviewed and agree.

## 2017-08-11 ENCOUNTER — Telehealth: Payer: Self-pay

## 2017-08-11 ENCOUNTER — Other Ambulatory Visit (INDEPENDENT_AMBULATORY_CARE_PROVIDER_SITE_OTHER): Payer: 59

## 2017-08-11 DIAGNOSIS — E538 Deficiency of other specified B group vitamins: Secondary | ICD-10-CM

## 2017-08-11 LAB — VITAMIN B12: VITAMIN B 12: 434 pg/mL (ref 211–911)

## 2017-08-11 NOTE — Telephone Encounter (Signed)
error 

## 2017-08-25 ENCOUNTER — Ambulatory Visit: Payer: 59 | Admitting: Family

## 2017-08-25 ENCOUNTER — Ambulatory Visit (INDEPENDENT_AMBULATORY_CARE_PROVIDER_SITE_OTHER): Payer: 59

## 2017-08-25 DIAGNOSIS — E538 Deficiency of other specified B group vitamins: Secondary | ICD-10-CM

## 2017-08-25 MED ORDER — CYANOCOBALAMIN 1000 MCG/ML IJ SOLN
1000.0000 ug | Freq: Once | INTRAMUSCULAR | Status: AC
  Administered 2017-08-25: 1000 ug via INTRAMUSCULAR

## 2017-09-26 ENCOUNTER — Ambulatory Visit (INDEPENDENT_AMBULATORY_CARE_PROVIDER_SITE_OTHER): Payer: 59 | Admitting: *Deleted

## 2017-09-26 DIAGNOSIS — E538 Deficiency of other specified B group vitamins: Secondary | ICD-10-CM | POA: Diagnosis not present

## 2017-09-26 MED ORDER — CYANOCOBALAMIN 1000 MCG/ML IJ SOLN
1000.0000 ug | Freq: Once | INTRAMUSCULAR | Status: AC
  Administered 2017-09-26: 1000 ug via INTRAMUSCULAR

## 2017-09-26 NOTE — Progress Notes (Signed)
I have reviewed and agree.

## 2017-10-10 ENCOUNTER — Telehealth: Payer: Self-pay | Admitting: Internal Medicine

## 2017-10-10 NOTE — Telephone Encounter (Signed)
Pt is requesting to get her B12 early. Please advise if you approve.

## 2017-10-10 NOTE — Telephone Encounter (Signed)
Pt would like to have her B12 every 2 weeks since she is tired and fatigued. Please advise

## 2017-10-10 NOTE — Telephone Encounter (Signed)
Copied from [place] [id number]. Topic: General - Other >> Oct 10, 2017 10:33 AM Rhonda [name] wrote: Reason for CRM: pt calling stating that she would like to get her B12 injection early because shes been feeling fatigued and she knows when she need injection

## 2017-10-17 NOTE — Telephone Encounter (Signed)
Pt called to check status of getting b12 injection early. She states that Jodi Mourning, NP started her on b12. Please advise.

## 2017-10-18 ENCOUNTER — Ambulatory Visit (INDEPENDENT_AMBULATORY_CARE_PROVIDER_SITE_OTHER): Payer: 59

## 2017-10-18 ENCOUNTER — Other Ambulatory Visit: Payer: Self-pay | Admitting: Family

## 2017-10-18 DIAGNOSIS — E538 Deficiency of other specified B group vitamins: Secondary | ICD-10-CM

## 2017-10-18 DIAGNOSIS — R5383 Other fatigue: Secondary | ICD-10-CM

## 2017-10-18 DIAGNOSIS — Z23 Encounter for immunization: Secondary | ICD-10-CM | POA: Diagnosis not present

## 2017-10-18 MED ORDER — CYANOCOBALAMIN 1000 MCG/ML IJ SOLN
1000.0000 ug | Freq: Once | INTRAMUSCULAR | Status: AC
  Administered 2017-10-18: 1000 ug via INTRAMUSCULAR

## 2017-10-18 NOTE — Progress Notes (Signed)
I have reviewed and agree.

## 2017-10-26 ENCOUNTER — Other Ambulatory Visit (INDEPENDENT_AMBULATORY_CARE_PROVIDER_SITE_OTHER): Payer: 59

## 2017-10-26 ENCOUNTER — Ambulatory Visit (INDEPENDENT_AMBULATORY_CARE_PROVIDER_SITE_OTHER): Payer: 59

## 2017-10-26 ENCOUNTER — Ambulatory Visit: Payer: 59

## 2017-10-26 DIAGNOSIS — E538 Deficiency of other specified B group vitamins: Secondary | ICD-10-CM | POA: Diagnosis not present

## 2017-10-26 DIAGNOSIS — R5383 Other fatigue: Secondary | ICD-10-CM | POA: Diagnosis not present

## 2017-10-26 LAB — VITAMIN D 25 HYDROXY (VIT D DEFICIENCY, FRACTURES): VITD: 18.55 ng/mL — AB (ref 30.00–100.00)

## 2017-10-26 LAB — COMPREHENSIVE METABOLIC PANEL
ALT: 15 U/L (ref 0–35)
AST: 15 U/L (ref 0–37)
Albumin: 3.8 g/dL (ref 3.5–5.2)
Alkaline Phosphatase: 75 U/L (ref 39–117)
BUN: 10 mg/dL (ref 6–23)
CHLORIDE: 101 meq/L (ref 96–112)
CO2: 32 meq/L (ref 19–32)
Calcium: 9.4 mg/dL (ref 8.4–10.5)
Creatinine, Ser: 0.87 mg/dL (ref 0.40–1.20)
GFR: 89.33 mL/min (ref >60.00)
GLUCOSE: 93 mg/dL (ref 70–99)
POTASSIUM: 4.2 meq/L (ref 3.5–5.1)
SODIUM: 138 meq/L (ref 135–145)
Total Bilirubin: 1.1 mg/dL (ref 0.2–1.2)
Total Protein: 7.4 g/dL (ref 6.0–8.3)

## 2017-10-26 LAB — CBC WITH DIFFERENTIAL/PLATELET
BASOS PCT: 1.2 % (ref 0.0–3.0)
Basophils Absolute: 0.1 10*3/uL (ref 0.0–0.1)
EOS PCT: 1.3 % (ref 0.0–5.0)
Eosinophils Absolute: 0.1 10*3/uL (ref 0.0–0.7)
HCT: 41 % (ref 36.0–46.0)
HEMOGLOBIN: 14.1 g/dL (ref 12.0–15.0)
LYMPHS ABS: 1.9 10*3/uL (ref 0.7–4.0)
Lymphocytes Relative: 31.9 % (ref 12.0–46.0)
MCHC: 34.4 g/dL (ref 30.0–36.0)
MCV: 90.3 fl (ref 78.0–100.0)
MONO ABS: 0.4 10*3/uL (ref 0.1–1.0)
Monocytes Relative: 6.2 % (ref 3.0–12.0)
NEUTROS ABS: 3.5 10*3/uL (ref 1.4–7.7)
Neutrophils Relative %: 59.4 % (ref 43.0–77.0)
PLATELETS: 412 10*3/uL — AB (ref 150.0–400.0)
RBC: 4.53 Mil/uL (ref 3.87–5.11)
RDW: 13.5 % (ref 11.5–15.5)
WBC: 5.9 10*3/uL (ref 4.0–10.5)

## 2017-10-26 LAB — VITAMIN B12: Vitamin B-12: 537 pg/mL (ref 211–911)

## 2017-10-26 MED ORDER — CYANOCOBALAMIN 1000 MCG/ML IJ SOLN
1000.0000 ug | Freq: Once | INTRAMUSCULAR | Status: AC
  Administered 2017-10-26: 1000 ug via INTRAMUSCULAR

## 2017-10-27 ENCOUNTER — Encounter: Payer: Self-pay | Admitting: Hematology and Oncology

## 2017-10-27 ENCOUNTER — Other Ambulatory Visit: Payer: Self-pay | Admitting: Family

## 2017-10-27 ENCOUNTER — Telehealth: Payer: Self-pay | Admitting: Hematology and Oncology

## 2017-10-27 MED ORDER — VITAMIN D (ERGOCALCIFEROL) 1.25 MG (50000 UNIT) PO CAPS: 50000.0000 [IU] | ORAL_CAPSULE | ORAL | 0 refills | Status: AC

## 2017-10-27 NOTE — Telephone Encounter (Signed)
New referral received from Marvis Repress, [place] for b12 deficiency. Pt has been scheduled to see Dr. Audelia Hives on 10/14 at [place]. Pt aware to arrive 30 minutes early. Letter mailed.

## 2017-10-29 NOTE — Progress Notes (Signed)
I have reviewed and agree.

## 2017-11-04 NOTE — Progress Notes (Signed)
[facility] Initial Consultation  Patient Name:  Rhonda [name]  DOB: 02/22/1970   Date of Service: November 06, 2017  Referring Provider: Marrian Salvage, FNP Scarlette Calico, MD [address] [facility] [address] [place], [place] [address]  Consulting Physician: Henreitta Leber, MD Hematology/Oncology   Reason for Referral: In the setting of vitamin B12 deficiency and thrombocytosis without anemia, she presents now for further diagnostic and therapeutic recommendations.  History Present Illness: Rhonda [name] is a 47 year old resident of [place] whose past medical history is significant for gastroesophageal reflux disease; vitamin D deficiency on replacement; irritable bowel syndrome; de Quervain tenosynovitis; Chiari malformation II; spinal stenosis; and allergic rhinitis.  Her primary care physician is Dr. Scarlette Calico.  She is followed closely by Marrian Salvage, nurse practitioner.  She is alone at this first visit.  On October 26, 2017 a complete blood count showed hemoglobin 14.1 hematocrit 41.0 MCV 90.3 RDW 13.5 with 59% neutrophils 32% lymphocytes 6% monocytes 1% eosinophils 1% basophil; platelets of 412,000.  Vitamin D 18.55.  A comprehensive metabolic panel was normal.  Those results are detailed below.  Vitamin B12 537 (211-911).    According to Rhonda [name], since at least 2010 she has been treated with vitamin B12 replacement.  Most recently, she started taking vitamin B12 by mouth.  In September because her vitamin B12 levels have not reflected injectable vitamin B12, she was given 4 weekly treatments.  The B12 is being administered in the office of Dr. Ronnald Ramp, now once monthly.  3 months earlier, her vitamin B12 level was 201.  2 months ago it was 434.  1 week earlier, it was 537.  She has no history of any blood disorders in her immediate family.  She has no diabetes mellitus; hypertension; or coronary artery disease.  She has no  cardiac dysrhythmia.  She denies seizure disorder or stroke syndrome.  She has no thyroid disease.  She has no peptic ulcer disease.  There is no viral hepatitis, inflammatory bowel disease, or symptomatic diverticulosis.  She has never had a screening colonoscopy.  She has chronic degenerative joint disease involving the knees and lower back.  She has a vague history of anemia in the distant past.  She has no peripheral arterial venous thromboembolic disease.  She has no bleeding tendency or easy bruisability.  In her family there is no history of any blood disorder.  Over the past several months, her appetite is fair while her weight has remained stable or increase.  She eats once daily with snacks in between.  She generally eats a fast food lunch and chips or candy afterward.  She does not generally eat supper.  She has been on his esomeprazole 4 times weekly for a lengthy period of time.  There is no rash or itching.  She does have white punctate areas in a diffuse distribution especially on the legs.  They are at most 1 mm in diameter.  She has no headache, dizziness, lightheadedness, syncope, near syncopal episodes.  He does control heartburn and indigestion.  Her energy level is low over the past 2 years. She reports no visual changes or hearing deficit.  She has no unusual cough, sore throat, orthopnea.  There is no dyspnea at rest. She has chronic but stable exertional dyspnea unchanged over her usual baseline.    She denies any pain or difficulty in swallowing.  No fever, shaking chills, sweats, or flulike symptoms are reported.  She has no nausea,  vomiting, diarrhea, or constipation.  She denies melena or bright red blood per rectum.  No urinary urgency, hematuria, or dysuria reported.  For 2 days she has had urinary frequency.  She has chronic pain in her knees on a constant basis.  She has lower intermittent lower back pain.  In the past she was told that she was anemic.  She has constant numbness  and tingling of her fingertips for the past 3 years.  The dorsum of her feet she has intermittent numbness.  It is with this background she presents now for further diagnostic and therapeutic recommendations in the setting of apparent vitamin B12 resistance as outlined above.  Past Medical History:  Diagnosis Date  . Allergic rhinitis   . Allergy   . B12 deficiency   . Carpal tunnel syndrome   . Dysfunction of eustachian tube    Past Surgical History:  Procedure Laterality Date  . BUNIONECTOMY     right   . CHOLECYSTECTOMY N/A 09/25/2015   Procedure: LAPAROSCOPIC CHOLECYSTECTOMY WITH INTRAOPERATIVE CHOLANGIOGRAM;  Surgeon: Alphonsa Overall, MD;  Location: [facility];  Service: General;  Laterality: N/A;  . TONSILLECTOMY     Gynecologic History: Her menarche was at age 54 years Rhonda [name] is nulliparous. Her last menses was 3 months ago.   Her menstruation is generally been every 28-30 days. She generally has 5 days of medium blood flow. She has been on oral contraception since her teens. Her last screening mammogram was in February, reportedly normal. Her last Pap smear was in February, reportedly normal.  Family History  Problem Relation Age of Onset  . Heart disease Father   . Hypertension Father   . Diabetes Father   . Heart disease Maternal Grandmother   . Headache Neg Hx   Mother: Age 68 years: 31 Father: Age 78 years: CAD/DM/HTN Brothers (2): Both brothers have diabetes; one brother has HTN She has no sisters  Social History   Socioeconomic History  . Marital status: Single    Spouse name: Not on file  . Number of children: 0  . Years of education: Assoc  . Highest education level: Not on file  Occupational History  . Occupation: Personal assistant  Social Needs  . Financial resource strain: Not on file  . Food insecurity:    Worry: Not on file    Inability: Not on file  . Transportation needs:    Medical: Not on file    Non-medical: Not on file  Tobacco  Use  . Smoking status: Never Smoker  . Smokeless tobacco: Never Used  Substance and Sexual Activity  . Alcohol use: No  . Drug use: No  . Sexual activity: Not on file  Lifestyle  . Physical activity:    Days per week: Not on file    Minutes per session: Not on file  . Stress: Not on file  Relationships  . Social connections:    Talks on phone: Not on file    Gets together: Not on file    Attends religious service: Not on file    Active member of club or organization: Not on file    Attends meetings of clubs or organizations: Not on file    Relationship status: Not on file  . Intimate partner violence:    Fear of current or ex partner: Not on file    Emotionally abused: Not on file    Physically abused: Not on file    Forced sexual activity: Not on file  Other  Topics Concern  . Not on file  Social History Narrative   College grad   Single   Bought her own home in '09   Occupation: Programme researcher, broadcasting/film/video and cleaning   Fun: Resting, yard work   Denies religious beliefs effecting health care.    Drinks caffeine daily   Aylana is divorced. She is a Product/process development scientist. She is a lifetime non-smoker. Her alcohol intake is infrequent, never heavy. She reports no recreational drug use.  Transfusion History: No prior transfusion  Exposure History: She has no known exposure to toxic chemicals, radiation, or pesticides.  Allergies  Allergen Reactions  . Bactrim Ds [Sulfamethoxazole-Trimethoprim]     blisters  . Cephalosporins     hives  . Oseltamivir Phosphate     REACTION: swelling  . Amoxicillin Hives and Rash    Has patient had a PCN reaction causing immediate rash, facial/tongue/throat swelling, SOB or lightheadedness with hypotension: Yes Has patient had a PCN reaction causing severe rash involving mucus membranes or skin necrosis: Yes Has patient had a PCN reaction that required hospitalization No Has patient had a PCN reaction occurring within the last 10 years: Yes If all of the  above answers are "NO", then may proceed with Cephalosporin use.   Marland Kitchen Doxycycline Rash  She has no food allergies She has nonspecific seasonal allergies  Current Outpatient Medications on File Prior to Visit  Medication Sig  . LO LOESTRIN FE 1 MG-10 MCG / 10 MCG tablet TK 1 T PO D UTD  . Vitamin D, Ergocalciferol, (DRISDOL) 50000 units CAPS capsule Take 1 capsule (50,000 Units total) by mouth every 7 (seven) days for 12 doses.  . fluticasone (FLONASE) 50 MCG/ACT nasal spray Place 2 sprays into both nostrils daily. (Patient not taking: Reported on 11/06/2017)  . terconazole (TERAZOL 3) 0.8 % vaginal cream I 1 APL INTRAVAGINALLY QHS   No current facility-administered medications on file prior to visit.     Review of Systems: Constitutional: No fever, sweats, or shaking chills.  No appetite or weight deficit; energy level low Skin: No rash, scaling, sores, lumps, or jaundice; punctate areas of hypopigmentation measuring 1 mm in both upper and especially lower extremities.  No pruritus. HEENT: No visual changes; allergic rhinitis; prior right ear infection with decreased hearing on the right.  No tinnitus. Pulmonary: No unusual cough, sore throat, or orthopnea; DOE. Breasts: No complaints. Cardiovascular: No coronary artery disease, angina, or myocardial infarction.  No cardiac dysrhythmia, essential hypertension, or dyslipidemia. Gastrointestinal: No indigestion, dysphagia, abdominal pain, diarrhea, or constipation.  No change in bowel habits; irritable bowel syndrome; GERD. Genitourinary: No urinary urgency, hematuria, or dysuria; urinary frequency over the past 2 days without burning; premenopausal. Musculoskeletal: Degenerative joint disease involving the lower back and knees.  No new arthralgias or myalgias; no joint swelling, pain, or instability; carpal tunnel; de Quervain tenosynovitis. Hematologic: No bleeding tendency or easy bruisability; vitamin B12 deficiency; vague history of  anemia; mild thrombocytosis. Endocrine: No intolerance to heat or cold; no thyroid disease or diabetes mellitus. Vascular: No peripheral arterial or venous thromboembolic disease. Psychological: No anxiety or depression, or mood changes; no mental health illnesses. Neurological: No dizziness, lightheadedness, syncope, or near syncopal episodes; constant numbness and tingling in the fingertips.  Intermittent numbness of the dorsum of the feet; Chiari malformation II.  Physical Examination: Vital Signs: Body surface area is 2.02 meters squared.  Vitals:   11/06/17 0910  BP: 119/81  Pulse: 81  Resp: 18  Temp: 98.9 F (37.2 C)  SpO2: 100%    Filed Weights   11/06/17 0910  Weight: 196 lb 12.8 oz (89.3 kg)  ECOG PERFORMANCE STATUS: 1 Constitutional:  Rhonda [name] is a fully nourished and developed African-American.  She looks age appropriate.  She is friendly and cooperative without respiratory compromise at rest. Skin: No rashes, scaling, dryness, jaundice, or itching; small punctate areas of hypopigmentation (1 mm) in both upper and lower extremities. HEENT: Head is normocephalic and atraumatic.  There is no obvious mechanical obstruction/irritation/discharge in the right ear canal; cone of light was easily visualized.  Pupils are equal round and reactive to light and accommodation.  Sclerae are anicteric.  Conjunctivae are pink.  No sinus tenderness nor oropharyngeal lesions.  Lips without cracking or peeling; tongue without mass, inflammation, or nodularity.  Mucous membranes are moist. Neck: Supple and symmetric.  No jugular venous distention or thyromegaly.  Trachea is midline. Lymphatics: No cervical or supraclavicular lymphadenopathy.  No epitrochlear, axillary, or inguinal lymphadenopathy is appreciated. Respiratory/chest: Thorax is symmetrical.  Breath sounds are clear to auscultation and percussion.  Normal excursion and respiratory effort. Back: Symmetric without deformity or  tenderness. Cardiovascular: Heart rate and rhythm are regular without murmurs. Gastrointestinal: Abdomen is soft, nontender; no organomegaly.  Bowel sounds are normoactive.  No masses are appreciated. Rectal examination: Not performed. Extremities: In the lower extremities, there is no asymmetric swelling, erythema, tenderness, or cord formation.  No clubbing, cyanosis, nor edema. Hematologic: No petechiae, hematomas, or ecchymoses. Psychological:  She is oriented to person, place, and time; normal affect. Neurological: There are no gross neurologic deficits.  Laboratory Results: I have reviewed the data as listed: CBC Latest Ref Rng & Units 11/06/2017 10/26/2017 03/22/2017  WBC 4.0 - 10.5 K/uL 8.1 5.9 6.0  Hemoglobin 12.0 - 15.0 g/dL 14.5 14.1 13.9  Hematocrit 36.0 - 46.0 % 42.5 41.0 40.3  Platelets 150 - 400 K/uL 388 412.0(H) 447.0(H)    CMP Latest Ref Rng & Units 10/26/2017 03/22/2017 02/24/2016  Glucose 70 - 99 mg/dL 93 99 89  BUN 6 - 23 mg/dL 10 8 7   Creatinine 0.40 - 1.20 mg/dL 0.87 0.86 0.80  Sodium 135 - 145 mEq/L 138 138 138  Potassium 3.5 - 5.1 mEq/L 4.2 4.0 3.8  Chloride 96 - 112 mEq/L 101 103 103  CO2 19 - 32 mEq/L 32 25 30  Calcium 8.4 - 10.5 mg/dL 9.4 9.6 9.3  Total Protein 6.0 - 8.3 g/dL 7.4 7.3 -  Total Bilirubin 0.2 - 1.2 mg/dL 1.1 1.0 -  Alkaline Phos 39 - 117 U/L 75 74 -  AST 0 - 37 U/L 15 11 -  ALT 0 - 35 U/L 15 12 -   Diagnostic/Imaging Studies: RENAL / URINARY TRACT ULTRASOUND COMPLETE  COMPARISON:  Abdominal ultrasound 08/28/2015  FINDINGS: Right Kidney:  Length: 10.0 cm. Echogenicity within normal limits. No mass or hydronephrosis visualized.  Left Kidney:  Length: 10.2 cm. Echogenicity within normal limits. No mass or hydronephrosis visualized.  Bladder:  Appears normal for degree of bladder distention.  There is a multiloculated septated cystic structure in the left adnexa, which measures 4.6 x 4.3 x 5.5 cm.  IMPRESSION: Normal  renal ultrasound.  Possible multiloculated left ovarian cyst, which measures 5.5 cm. Further evaluation with dedicated pelvic ultrasound may be considered.  Fidela Salisbury M.D. 02/17/2017 17:40  Summary/Assessment: In the setting of vitamin B12 resistance and thrombocytosis without anemia, she presents now for further diagnostic and therapeutic recommendations.  On October 26, 2017 a complete blood count  showed hemoglobin 14.1 hematocrit 41.0 MCV 90.3 RDW 13.5 with 59% neutrophils 32% lymphocytes 6% monocytes 1% eosinophils 1% basophil; platelets of 412,000.  Vitamin D 18.55.  A comprehensive metabolic panel was normal.  Those results are detailed below.  Vitamin B12 537 (211-911).    According to Rhonda [name], since at least 2010 she has been treated with vitamin B12 replacement.  Most recently, she started taking vitamin B12 by mouth.  In September because her vitamin B12 levels have not reflected injectable vitamin B12, she was given 4 weekly treatments.  The B12 is being administered in the office of Dr. Clydene Laming, now once monthly.  3 months earlier, her vitamin B12 level was 201.  2 months ago it was 434.  1 week earlier, it was 537.    She has chronic degenerative joint disease involving the knees and lower back.  She has a vague history of anemia in the distant past.  She has no peripheral arterial venous thromboembolic disease.  She has no bleeding tendency or easy bruisability.  She has not had bariatric surgery.  In her family there is no history of any blood disorder.  Over the past several months, her appetite is fair while her weight has remained stable or increase.  She eats once daily with snacks in between.  She generally eats a fast food lunch and chips or candy afterward.  She does not generally eat supper.  She has been on his esomeprazole 4 times weekly for a lengthy period of time.  There is no rash or itching.  She does have white punctate areas of hypopigmentation distributed in  both upper and lower extremities. They measure at most 1 mm in diameter.  She has no headache, dizziness, lightheadedness, syncope, near syncopal episodes.  He does control heartburn and indigestion.  Her energy level is low over the past 2 years. She reports no visual changes.  In the past she has had a right ear infection associated with pain, hearing deficit, without discharge or tinnitus.  She has no unusual cough, sore throat, orthopnea.  There is no dyspnea at rest.    She has chronic but stable exertional dyspnea unchanged over her usual baseline.  She denies any pain or difficulty in swallowing.  No fever, shaking chills, sweats, or flulike symptoms are reported.  She has no nausea, vomiting, diarrhea, or constipation.  She denies melena or bright red blood per rectum.  No urinary urgency, hematuria, or dysuria reported.  For 2 days she has had urinary frequency.  She has chronic pain in her knees on a constant basis.  She has lower intermittent lower back pain.  In the past she was told that she was anemic.  She has constant numbness and tingling of her fingertips for the past 3 years.  The dorsum of her feet she has intermittent numbness.  It is with this background she presents now for further diagnostic and therapeutic recommendations in the setting of apparent vitamin B12 resistance as outlined above.  Her other comorbid problems include gastroesophageal reflux disease; vitamin D deficiency on replacement; irritable bowel syndrome; de Quervain tenosynovitis; Chiari malformation II; spinal stenosis; and allergic rhinitis.    Recommendation/Plan: The results of her laboratory studies from today were not available at the time of discharge.  They will be discussed in detail at the time of her next visit.  We reviewed the metabolism of vitamin B12, possible lack of absorption, and preferred testing with both the metabolites of vitamin B12 and its  RBC cofactor folic acid.  It was explained that the  blood levels of vitamin W54 and folic acid generally reflect dietary intake and not tissue utilization and/or availability.  Questions were answered to her satisfaction.  Because her platelet count is persistently but mildly elevated, laboratory studies were done to exclude iron deficiency since she still continues to menstruate.  It is possible that she is iron deficient but not depleted.  Laboratory studies were done today to evaluate the metabolites of vitamin O27 and folic acid and whether an intrinsic factor antibody exist that may interfere with absorption of vitamin O35 and/or folic acid.  Although the test for intrinsic factor antibody is less than perfect, a positive test would lend credence to antibodies directed against vitamin B12 receptors that is absorption.  Barring any unforeseen complications, her next scheduled doctor visit to discuss those results with recommendations is on October 28.  She was advised to call us in the interim should any new or untoward problems arise.  The total time spent discussing the role, rationale, and specific testing for vitamin K09 and/or folic acid deficiency, and the rationale for iron studies in the setting of thrombocytosis without obvious explanation was 50 minutes.  At least 50% of that time was spent in discussion, reviewing outside records, laboratory evaluation, counseling, and answering questions. All questions were answered to her satisfaction.   This note was dictated using voice activated technology/software.  Unfortunately, typographical errors are not uncommon, and transcription is subject to mistakes and regrettably misinterpretation.  If necessary, clarification of the above information can be discussed with me at any time.  Thank you Dr. Clydene Laming and Mickel Baas for allowing my participation in the care of [facility]. I will keep you closely informed as the results of her preliminary laboratory data become available.  Please do not hesitate to  call should any questions arise regarding this initial consultation and discussion.  FOLLOW UP: AS DIRECTED   cc:        Marrian Salvage, FNP              Scarlette Calico, MD  Henreitta Leber, MD  Hematology/Oncology [place] [address] [place], [place] [address] Office: [phone] LFYB: [phone]

## 2017-11-06 ENCOUNTER — Inpatient Hospital Stay: Payer: 59 | Attending: Hematology and Oncology | Admitting: Hematology and Oncology

## 2017-11-06 ENCOUNTER — Other Ambulatory Visit: Payer: Self-pay

## 2017-11-06 ENCOUNTER — Inpatient Hospital Stay: Payer: 59

## 2017-11-06 ENCOUNTER — Telehealth: Payer: Self-pay | Admitting: Hematology and Oncology

## 2017-11-06 ENCOUNTER — Encounter: Payer: Self-pay | Admitting: Hematology and Oncology

## 2017-11-06 VITALS — BP 119/81 | HR 81 | Temp 98.9°F | Resp 18 | Ht 65.0 in | Wt 196.8 lb

## 2017-11-06 DIAGNOSIS — E559 Vitamin D deficiency, unspecified: Secondary | ICD-10-CM | POA: Diagnosis not present

## 2017-11-06 DIAGNOSIS — R350 Frequency of micturition: Secondary | ICD-10-CM | POA: Insufficient documentation

## 2017-11-06 DIAGNOSIS — E538 Deficiency of other specified B group vitamins: Secondary | ICD-10-CM

## 2017-11-06 DIAGNOSIS — D75839 Thrombocytosis, unspecified: Secondary | ICD-10-CM | POA: Insufficient documentation

## 2017-11-06 DIAGNOSIS — D473 Essential (hemorrhagic) thrombocythemia: Secondary | ICD-10-CM

## 2017-11-06 LAB — CBC WITH DIFFERENTIAL (CANCER CENTER ONLY)
Abs Immature Granulocytes: 0.02 10*3/uL (ref 0.00–0.07)
BASOS ABS: 0 10*3/uL (ref 0.0–0.1)
Basophils Relative: 0 %
EOS ABS: 0 10*3/uL (ref 0.0–0.5)
Eosinophils Relative: 0 %
HCT: 42.5 % (ref 36.0–46.0)
HEMOGLOBIN: 14.5 g/dL (ref 12.0–15.0)
Immature Granulocytes: 0 %
Lymphocytes Relative: 22 %
Lymphs Abs: 1.8 10*3/uL (ref 0.7–4.0)
MCH: 31 pg (ref 26.0–34.0)
MCHC: 34.1 g/dL (ref 30.0–36.0)
MCV: 91 fL (ref 80.0–100.0)
MONO ABS: 0.3 10*3/uL (ref 0.1–1.0)
Monocytes Relative: 4 %
NRBC: 0 % (ref 0.0–0.2)
Neutro Abs: 6 10*3/uL (ref 1.7–7.7)
Neutrophils Relative %: 74 %
Platelet Count: 388 10*3/uL (ref 150–400)
RBC: 4.67 MIL/uL (ref 3.87–5.11)
RDW: 13 % (ref 11.5–15.5)
WBC: 8.1 10*3/uL (ref 4.0–10.5)

## 2017-11-06 LAB — VITAMIN B12: VITAMIN B 12: 329 pg/mL (ref 180–914)

## 2017-11-06 LAB — FOLATE: FOLATE: 8.8 ng/mL (ref >5.9)

## 2017-11-06 NOTE — Patient Instructions (Signed)
We discussed in detail the vitamin B 12 metabolism, possible lack of absorption, and preferred testing with the metabolite of both vitamin B12 and its cofactor folic acid.  Because of your prior elevation in platelets, laboratory studies were done to exclude iron deficiency which is a common but subtle finding associated with an elevation of platelets.  Laboratory studies will be done today to test for the metabolite of vitamin A75 and folic acid, and whether there is an antibody directed against vitamin B12.  The complete blood count will be repeated today.  Barring any unforeseen complications, your scheduled follow-up visit to discuss these results will be on October 28.  Please do not hesitate to call should questions arise in the interim.  Thank you! Ladona Ridgel, MD Hematology/Oncology

## 2017-11-06 NOTE — Telephone Encounter (Signed)
Scheduled appt per 10/14 los - gave patient AVS and calender per los.   

## 2017-11-07 LAB — HOMOCYSTEINE: Homocysteine: 11.3 umol/L (ref 0.0–15.0)

## 2017-11-07 LAB — FERRITIN: FERRITIN: 81 ng/mL (ref 11–307)

## 2017-11-07 LAB — IRON AND TIBC
Iron: 87 ug/dL (ref 41–142)
Saturation Ratios: 24 % (ref 21–57)
TIBC: 356 ug/dL (ref 236–444)
UIBC: 269 ug/dL

## 2017-11-07 LAB — INTRINSIC FACTOR ANTIBODIES: INTRINSIC FACTOR: 1 [AU]/ml (ref 0.0–1.1)

## 2017-11-07 LAB — ERYTHROPOIETIN: ERYTHROPOIETIN: 8.6 m[IU]/mL (ref 2.6–18.5)

## 2017-11-08 LAB — METHYLMALONIC ACID, SERUM: METHYLMALONIC ACID, QUANTITATIVE: 83 nmol/L (ref 0–378)

## 2017-11-20 ENCOUNTER — Telehealth: Payer: Self-pay | Admitting: Hematology and Oncology

## 2017-11-20 ENCOUNTER — Encounter: Payer: Self-pay | Admitting: Hematology and Oncology

## 2017-11-20 ENCOUNTER — Inpatient Hospital Stay (HOSPITAL_BASED_OUTPATIENT_CLINIC_OR_DEPARTMENT_OTHER): Payer: 59 | Admitting: Hematology and Oncology

## 2017-11-20 VITALS — BP 117/69 | HR 96 | Temp 98.9°F | Resp 17 | Ht 65.0 in | Wt 194.7 lb

## 2017-11-20 DIAGNOSIS — E538 Deficiency of other specified B group vitamins: Secondary | ICD-10-CM

## 2017-11-20 DIAGNOSIS — D473 Essential (hemorrhagic) thrombocythemia: Secondary | ICD-10-CM | POA: Diagnosis not present

## 2017-11-20 DIAGNOSIS — R350 Frequency of micturition: Secondary | ICD-10-CM

## 2017-11-20 DIAGNOSIS — E559 Vitamin D deficiency, unspecified: Secondary | ICD-10-CM

## 2017-11-20 NOTE — Progress Notes (Signed)
[facility]ology/Oncology Outpatient Progress Note  Patient Name:  Rhonda [name]  DOB: [birth year]   Date of Service: November 20, 2017  Referring Provider: Scarlette Calico, MD [address] [facility] [address] [place], [place] [address]   Consulting Physician: Henreitta Leber, MD Hematology/Oncology  Reason for Visit: In the setting of vitamin B12 deficiency and transient thrombocytosis, she presents now for the results of her preliminary evaluation and recommendations.  Brief History: Rhonda [name] is a 47 year old resident of [place] whose past medical history is significant for gastroesophageal reflux disease; vitamin D deficiency on replacement; irritable bowel syndrome; de Quervain tenosynovitis; Chiari malformation II; spinal stenosis; and allergic rhinitis.  Her primary care physician is Dr. Scarlette Calico.  She is followed closely by Marrian Salvage, nurse practitioner.  She is alone at this visit.  On October 26, 2017 a complete blood count showed hemoglobin 14.1 hematocrit 41.0 MCV 90.3 RDW 13.5 with 59% neutrophils 32% lymphocytes 6% monocytes 1% eosinophils 1% basophil; platelets of 412,000.  Vitamin D 18.55.  A comprehensive metabolic panel was normal.  Those results are detailed below.  Vitamin B12 537 (211-911).    According to [name], since at least 2010 she has been treated with vitamin B12 replacement.  Most recently, she started taking vitamin B12 by mouth.  In September because her vitamin B12 levels have not reflected injectable vitamin B12, she was given 4 weekly treatments.  The B12 is being administered in the office of Dr. Ronnald Ramp, now once monthly.  3 months earlier, her vitamin B12 level was 201.  2 months ago it was 434.  1 week earlier, it was 537.  She has no history of any blood disorders in her immediate family.  Over the past several months, her appetite is fair while her weight has remained stable or increased. She eats once daily  with snacks in between.  She generally eats a fast food lunch and chips or candy afterward.  She does not generally eat supper.  She has been on his esomeprazole 4 times weekly for a lengthy period of time.  There is no rash or itching.  She does have white punctate areas in a diffuse distribution especially on the legs.  They are at most 1 mm in diameter.    At the time of our initial visit, we reviewed the metabolism of vitamin B12, possible lack of absorption, and preferred testing with both the metabolites of vitamin B12 and its RBC cofactor folic acid.  It was explained that the blood levels of vitamin T70 and folic acid generally reflect dietary intake and not tissue utilization and/or availability. Although the test for intrinsic factor antibody is less than perfect, a positive test would lend credence to antibodies directed against vitamin B12 receptors which would interfere with absorption. Those antibodies are not necessary to make a diagnosis of B12 deficiency however.  Because her platelet count was persistently but mildly elevated, laboratory studies were done to exclude iron deficiency.  Her last menstruation was 3 months ago, however.  It is with this background she presents now for the results of her preliminary evaluation and recommendations in the setting of apparent vitamin B12 resistance as outlined above.  Interval History: In the interim since her last visit, she complains of headaches every other day without dizziness, lightheadedness, syncope, near syncopal episodes.  She takes ibuprofen: 800 mg once daily.  She complains today of substernal achiness over the past several days, occurring intermittently.  She has no  nausea or diaphoresis.  The achiness does not radiate.  She has no pain or difficulty in swallowing.  She reports no heartburn and indigestion.  Her energy level is low over the past 2 years. She reports no visual changes or hearing deficit.  She has no unusual cough, sore  throat, orthopnea.  There is no dyspnea at rest. She has chronic but stable exertional dyspnea unchanged over her usual baseline.  No fever, shaking chills, sweats, or flulike symptoms are reported.  She has no nausea, vomiting, diarrhea, or constipation.  She denies melena or bright red blood per rectum. No urinary urgency, hematuria, or dysuria reported.  She has chronic pain in her knees on a constant basis. She has intermittent lower back pain. She has constant numbness and tingling of her fingertips for the past 3 years. The dorsum of her feet she has intermittent numbness.  Past Medical History Reviewed        Family History Reviewed       Social History Reviewed  Past Medical History:  Diagnosis Date  . Allergic rhinitis   . Allergy   . B12 deficiency   . Carpal tunnel syndrome   . Dysfunction of eustachian tube    Allergies  Allergen Reactions  . Bactrim Ds [Sulfamethoxazole-Trimethoprim]     blisters  . Cephalosporins     hives  . Oseltamivir Phosphate     REACTION: swelling  . Amoxicillin Hives and Rash    Has patient had a PCN reaction causing immediate rash, facial/tongue/throat swelling, SOB or lightheadedness with hypotension: Yes Has patient had a PCN reaction causing severe rash involving mucus membranes or skin necrosis: Yes Has patient had a PCN reaction that required hospitalization No Has patient had a PCN reaction occurring within the last 10 years: Yes If all of the above answers are "NO", then may proceed with Cephalosporin use.   Marland Kitchen Doxycycline Rash  She has no food allergies She has nonspecific seasonal allergies  Current Outpatient Medications on File Prior to Visit  Medication Sig  . fluticasone (FLONASE) 50 MCG/ACT nasal spray Place 2 sprays into both nostrils daily.  . LO LOESTRIN FE 1 MG-10 MCG / 10 MCG tablet TK 1 T PO D UTD  . terconazole (TERAZOL 3) 0.8 % vaginal cream I 1 APL INTRAVAGINALLY QHS  . Vitamin D, Ergocalciferol, (DRISDOL) 50000  units CAPS capsule Take 1 capsule (50,000 Units total) by mouth every 7 (seven) days for 12 doses.   No current facility-administered medications on file prior to visit.     Review of Systems: Constitutional: No fever, sweats, or shaking chills.  No appetite or weight deficit; energy level low Skin: No rash, scaling, sores, lumps, or jaundice; punctate areas of hypopigmentation measuring 1 mm in both upper and especially lower extremities.  No pruritus. HEENT: No visual changes; allergic rhinitis; prior right ear infection with decreased hearing on the right.  No tinnitus. Pulmonary: No unusual cough, sore throat, or orthopnea; DOE. Breasts: No complaints. Cardiovascular: No coronary artery disease, angina, or myocardial infarction.  No cardiac dysrhythmia, essential hypertension, or dyslipidemia. Gastrointestinal: No indigestion, dysphagia, abdominal pain, diarrhea, or constipation.  No change in bowel habits; irritable bowel syndrome; GERD. Genitourinary: No urinary urgency, hematuria, or dysuria; urinary frequency over the past 2 days without burning; premenopausal. Musculoskeletal: Degenerative joint disease involving the lower back and knees.  No new arthralgias or myalgias; no joint swelling, pain, or instability; carpal tunnel; de Quervain tenosynovitis. Hematologic: No bleeding tendency or easy bruisability;  vitamin B12 deficiency; vague history of anemia; mild thrombocytosis. Endocrine: No intolerance to heat or cold; no thyroid disease or diabetes mellitus. Vascular: No peripheral arterial or venous thromboembolic disease. Psychological: No anxiety or depression, or mood changes; no mental health illnesses. Neurological: No dizziness, lightheadedness, syncope, or near syncopal episodes; constant numbness and tingling in the fingertips.  Intermittent numbness of the dorsum of the feet; Chiari malformation II.  Physical Examination: Vital Signs: Body surface area is 2.01 meters squared.   Vitals:   11/20/17 1259  BP: 117/69  Pulse: 96  Resp: 17  Temp: 98.9 F (37.2 C)  SpO2: 100%    Filed Weights   11/20/17 1259  Weight: 194 lb 11.2 oz (88.3 kg)  Body mass index is 32.4 kg/m. ECOG PERFORMANCE STATUS: 1 Constitutional:  Jeyda Siebel is a fully nourished and developed African-American.  She looks age appropriate.  She is friendly and cooperative without respiratory compromise at rest. Skin: No rashes, scaling, dryness, jaundice, or itching; small punctate areas of hypopigmentation (1 mm) in both upper and lower extremities. HEENT: Head is normocephalic and atraumatic.  There is no obvious mechanical obstruction/irritation/discharge in the right ear canal; cone of light was easily visualized.  Pupils are equal round and reactive to light and accommodation.  Sclerae are anicteric.  Conjunctivae are pink.  No sinus tenderness nor oropharyngeal lesions.  Lips without cracking or peeling; tongue without mass, inflammation, or nodularity.  Mucous membranes are moist. Neck: Supple and symmetric.  No jugular venous distention or thyromegaly.  Trachea is midline. Lymphatics: No cervical or supraclavicular lymphadenopathy.  No epitrochlear, axillary, or inguinal lymphadenopathy is appreciated. Respiratory/chest: Thorax is symmetrical.  Breath sounds are clear to auscultation and percussion.  Normal excursion and respiratory effort. Back: Symmetric without deformity or tenderness. Cardiovascular: Heart rate and rhythm are regular without murmurs. Gastrointestinal: Abdomen is soft, nontender; no organomegaly.  Bowel sounds are normoactive.  No masses are appreciated. Rectal examination: Not performed. Extremities: In the lower extremities, there is no asymmetric swelling, erythema, tenderness, or cord formation.  No clubbing, cyanosis, nor edema. Hematologic: No petechiae, hematomas, or ecchymoses. Psychological:  She is oriented to person, place, and time; normal  affect. Neurological: There are no gross neurologic deficits.  Laboratory Results: November 06, 2017  Ref Range & Units 2wk ago  WBC Count 4.0 - 10.5 K/uL 8.1   RBC 3.87 - 5.11 MIL/uL 4.67   Hemoglobin 12.0 - 15.0 g/dL 14.5   HCT 36.0 - 46.0 % 42.5   MCV 80.0 - 100.0 fL 91.0   MCH 26.0 - 34.0 pg 31.0   MCHC 30.0 - 36.0 g/dL 34.1   RDW 11.5 - 15.5 % 13.0   Platelet Count 150 - 400 K/uL 388   nRBC 0.0 - 0.2 % 0.0   Neutrophils Relative % % 74   Neutro Abs 1.7 - 7.7 K/uL 6.0   Lymphocytes Relative % 22   Lymphs Abs 0.7 - 4.0 K/uL 1.8   Monocytes Relative % 4   Monocytes Absolute 0.1 - 1.0 K/uL 0.3   Eosinophils Relative % 0   Eosinophils Absolute 0.0 - 0.5 K/uL 0.0   Basophils Relative % 0   Basophils Absolute 0.0 - 0.1 K/uL 0.0   Immature Granulocytes % 0   Abs Immature Granulocytes 0.00 - 0.07 K/uL 0.02    CMP Latest Ref Rng & Units 10/26/2017 03/22/2017 02/24/2016  Glucose 70 - 99 mg/dL 93 99 89  BUN 6 - 23 mg/dL 10 8 7   Creatinine  0.40 - 1.20 mg/dL 0.87 0.86 0.80  Sodium 135 - 145 mEq/L 138 138 138  Potassium 3.5 - 5.1 mEq/L 4.2 4.0 3.8  Chloride 96 - 112 mEq/L 101 103 103  CO2 19 - 32 mEq/L 32 25 30  Calcium 8.4 - 10.5 mg/dL 9.4 9.6 9.3  Total Protein 6.0 - 8.3 g/dL 7.4 7.3 -  Total Bilirubin 0.2 - 1.2 mg/dL 1.1 1.0 -  Alkaline Phos 39 - 117 U/L 75 74 -  AST 0 - 37 U/L 15 11 -  ALT 0 - 35 U/L 15 12 -  Homocysteine 11.3 Methylmalonic acid 83 Intrinsic factor antibody 1.0 (0-1.1) Vitamin Z32 992 Folic acid 8.8 Erythropoietin 8.6 Ferritin 81 Iron/TIBC 87/356 Iron saturation 24%  Diagnostic/Imaging Studies: February 17, 2017 RENAL / URINARY TRACT ULTRASOUND COMPLETE  COMPARISON: Abdominal ultrasound 08/28/2015  FINDINGS: Right Kidney:  Length: 10.0 cm. Echogenicity within normal limits. No mass or hydronephrosis visualized.  Left Kidney:  Length: 10.2 cm. Echogenicity within normal limits. No mass or hydronephrosis  visualized.  Bladder:  Appears normal for degree of bladder distention.  There is a multiloculated septated cystic structure in the left adnexa, which measures 4.6 x 4.3 x 5.5 cm.  IMPRESSION: Normal renal ultrasound.  Possible multiloculated left ovarian cyst, which measures 5.5 cm. Further evaluation with dedicated pelvic ultrasound may be considered.  Fidela Salisbury M.D. 02/17/2017 17:40  Summary/Assessment: In the setting of vitamin B12 deficiency and transient thrombocytosis, she presents now for the results of her preliminary evaluation and recommendations.  On October 26, 2017 a complete blood count showed hemoglobin 14.1 hematocrit 41.0 MCV 90.3 RDW 13.5 with 59% neutrophils 32% lymphocytes 6% monocytes 1% eosinophils 1% basophil; platelets of 412,000.  Vitamin D 18.55.  A comprehensive metabolic panel was normal. Vitamin B12 537 (211-911). Those results are detailed below.     According to [facility], since at least 2010 she has been treated with vitamin B12 replacement.  Most recently, she started taking vitamin B12 by mouth.  In September because her vitamin B12 levels have not reflected injectable vitamin B12, she was given 4 weekly treatments.  The B12 is being administered in the office of Dr. Ronnald Ramp, now once monthly.  3 months earlier, her vitamin B12 level was 201.  2 months ago it was 434.  1 week earlier, it was 537.  She has no history of any blood disorders in her immediate family.  Over the past several months, her appetite is fair while her weight has remained stable or increased. She eats once daily with snacks in between.  She generally eats a fast food lunch and chips or candy afterward.  She does not generally eat supper.  She has been on his esomeprazole 4 times weekly for a lengthy period of time.  There is no rash or itching.  She does have white punctate areas in a diffuse distribution especially on the legs.  They are at most 1 mm in diameter.    At  the time of our initial visit, we reviewed the metabolism of vitamin B12, possible lack of absorption, and preferred testing with both the metabolites of vitamin B12 and its RBC cofactor folic acid.  It was explained that the blood levels of vitamin E26 and folic acid generally reflect dietary intake and not tissue utilization and/or availability. Although the test for intrinsic factor antibody is less than perfect, a positive test would lend credence to antibodies directed against vitamin B12 receptors which would interfere with absorption. Those antibodies  are not necessary to make a diagnosis of B12 deficiency however.  Because her platelet count was persistently but mildly elevated, laboratory studies were done to exclude iron deficiency.  Her last menstruation was 3 months ago, however.    In the interim since her last visit, she complains of headaches every other day without dizziness, lightheadedness, syncope, near syncopal episodes.  She takes ibuprofen: 800 mg once daily.  She complains today of substernal achiness over the past several days, occurring intermittently.  She has no nausea or diaphoresis.  The achiness does not radiate.  She has no pain or difficulty in swallowing.  She reports no heartburn and indigestion.  Her energy level is low over the past 2 years. She reports no visual changes or hearing deficit.  She has no unusual cough, sore throat, orthopnea.  There is no dyspnea at rest. She has chronic but stable exertional dyspnea unchanged over her usual baseline.  No fever, shaking chills, sweats, or flulike symptoms are reported.  She has no nausea, vomiting, diarrhea, or constipation.  She denies melena or bright red blood per rectum. No urinary urgency, hematuria, or dysuria reported.  She has chronic pain in her knees on a constant basis. She has intermittent lower back pain. She has constant numbness and tingling of her fingertips for the past 3 years. The dorsum of her feet she has  intermittent numbness.  Her other comorbid problems include gastroesophageal reflux disease; vitamin D deficiency on replacement; irritable bowel syndrome; de Quervain tenosynovitis; Chiari malformation II; spinal stenosis; and allergic rhinitis.   Recommendation/Plan: We discussed in detail the results of her initial laboratory evaluation.  There is no evidence of vitamin B12 deficiency.  Both the metabolites of vitamin O67 and folic acid, methylmalonic acid and homocysteine were normal.  Her platelet count was normal on October 14.  Her iron studies however, are somewhat equivocal and suggest that she may have some degree of iron deficit without iron depletion.  Because she has not menstruated in 3 months, that may explain why her iron level (and platelet count) are now in normal range.  Iron deficiency can precipitate mild thrombocytosis.  Her erythropoietin level is normal.  In a myeloproliferative disease, the erythropoietin level is low. With her degree of transient thrombocytosis, a myeloproliferative disease is not suspected.  Given the low cost of vitamin B12, I would continue to supplement with monthly injections: Vitamin B12: 1000 mcg IM once monthly.  The addition of a vitamin B 50 complex is also reasonable to supplement her other important B vitamins.  Because of her dull and achy intermittent mid chest pain, it was recommended that she discuss the risk versus benefits of high-dose ibuprofen which she takes on a daily basis.  I suspect some degree of dyspepsia.  She was advised that daily ibuprofen in high dose can precipitate renal or liver dysfunction, as well as bleeding diathesis. She verbalized understanding.  Barring any unforeseen complications, a follow-up visit with lab work is recommended on February 05, 2018.  She was advised to call us in the interim should any new or untoward problems arise.  The total time spent discussing the results of her laboratory studies, expected  fluctuation of vitamin B-12 when given both by mouth and parenterally, and suspected low level of iron deficit without frank depletion was 25 minutes. At least 50% of that time was spent in face-to-face discussion, reviewing laboratory studies, counseling, and answering questions.  This note was dictated using voice activated technology/software.  Unfortunately, typographical errors are not uncommon, and transcription is subject to mistakes and regrettably misinterpretation.  If necessary, clarification of the above information can be discussed with me at any time.  FOLLOW UP: AS DIRECTED   cc:        Marrian Salvage, FNP              Scarlette Calico, MD   Henreitta Leber, MD  Hematology/Oncology [facility] [address] [place], [place] [address] Office: [phone] VLRT: [phone]

## 2017-11-20 NOTE — Patient Instructions (Addendum)
We discussed in detail the results of your previous laboratory evaluation.  Those results indicate a normal complete blood count.  Normal white blood cells, normal red blood cells, and normal platelets.  Both your vitamin U63 and folic acid levels were normal.  Their are metabolites were also normal.  There is no evidence of nutritional deficiency.  Given the absence of side effects and cost of vitamin B12, I would continue monthly injections.  We mentioned that perhaps your mildly elevated platelet count several months ago may be a reactive process secondary to either some stress like infection/inflammation or a temporary decrease in your iron level.  It is recommended that you follow-up with Dr. Ronnald Ramp or Mickel Baas regarding your dull achy mid chest discomfort.  You are taking a large dose of ibuprofen which on a daily basis can have adverse effects.  Although less than a perfect test, your iron studies do not suggest iron depletion.  They were normal.  Barring any unforeseen complications, a follow-up visit with lab work is recommended on January 13.  Please do not hesitate to call if any questions arise in the interim.   Thank you! Ladona Ridgel, MD Hematology/Oncology

## 2017-11-20 NOTE — Telephone Encounter (Signed)
Gave pt avs and calendar  °

## 2017-11-27 ENCOUNTER — Ambulatory Visit (INDEPENDENT_AMBULATORY_CARE_PROVIDER_SITE_OTHER): Payer: 59

## 2017-11-27 DIAGNOSIS — E538 Deficiency of other specified B group vitamins: Secondary | ICD-10-CM

## 2017-11-27 MED ORDER — CYANOCOBALAMIN 1000 MCG/ML IJ SOLN
1000.0000 ug | Freq: Once | INTRAMUSCULAR | Status: AC
  Administered 2017-11-27: 1000 ug via INTRAMUSCULAR

## 2017-11-27 NOTE — Progress Notes (Addendum)
Cyan  I have reviewed and agree

## 2017-12-28 ENCOUNTER — Ambulatory Visit (INDEPENDENT_AMBULATORY_CARE_PROVIDER_SITE_OTHER): Payer: 59

## 2017-12-28 DIAGNOSIS — E538 Deficiency of other specified B group vitamins: Secondary | ICD-10-CM

## 2017-12-28 MED ORDER — CYANOCOBALAMIN 1000 MCG/ML IJ SOLN
1000.0000 ug | Freq: Once | INTRAMUSCULAR | Status: AC
  Administered 2017-12-28: 1000 ug via INTRAMUSCULAR

## 2017-12-28 NOTE — Progress Notes (Signed)
I have reviewed and agree.

## 2018-01-18 ENCOUNTER — Ambulatory Visit (INDEPENDENT_AMBULATORY_CARE_PROVIDER_SITE_OTHER)
Admission: RE | Admit: 2018-01-18 | Discharge: 2018-01-18 | Disposition: A | Discharge status: Home / Self Care | Payer: 59 | Source: Ambulatory Visit | Attending: Family | Admitting: Family

## 2018-01-18 ENCOUNTER — Encounter: Payer: Self-pay | Admitting: Family

## 2018-01-18 ENCOUNTER — Other Ambulatory Visit: Payer: 59

## 2018-01-18 ENCOUNTER — Ambulatory Visit: Payer: 59 | Admitting: Family

## 2018-01-18 VITALS — BP 118/74 | HR 89 | Temp 98.0°F | Ht 65.0 in | Wt 198.1 lb

## 2018-01-18 DIAGNOSIS — G8929 Other chronic pain: Secondary | ICD-10-CM

## 2018-01-18 DIAGNOSIS — M545 Low back pain, unspecified: Secondary | ICD-10-CM

## 2018-01-18 DIAGNOSIS — R899 Unspecified abnormal finding in specimens from other organs, systems and tissues: Secondary | ICD-10-CM | POA: Diagnosis not present

## 2018-01-18 DIAGNOSIS — Z113 Encounter for screening for infections with a predominantly sexual mode of transmission: Secondary | ICD-10-CM

## 2018-01-18 LAB — THYROID PANEL WITH TSH
Free Thyroxine Index: 2.2 (ref 1.4–3.8)
T3 UPTAKE: 28 % (ref 22–35)
T4, Total: 7.7 ug/dL (ref 5.1–11.9)
TSH: 0.49 mIU/L

## 2018-01-18 MED ORDER — METHYLPREDNISOLONE 4 MG PO TBPK: ORAL_TABLET | ORAL | 0 refills | Status: DC

## 2018-01-18 MED ORDER — METHOCARBAMOL 500 MG PO TABS: 500.0000 mg | ORAL_TABLET | Freq: Every evening | ORAL | 0 refills | Status: DC | PRN

## 2018-01-18 NOTE — Progress Notes (Signed)
Rhonda [name] is a 47 y.o. female with the following history as recorded in EpicCare:  Patient Active Problem List   Diagnosis Date Noted  . Thrombocytosis ([place]) 11/06/2017  . Acute viral conjunctivitis of both eyes 10/12/2016  . Chiari malformation type II ([place]) 09/27/2016  . Abnormal MRI of head 03/07/2016  . Elevated blood sugar level 02/24/2016  . Visual disturbance 02/24/2016  . Gallstones and inflammation of gallbladder without obstruction 08/28/2015  . Gastroesophageal reflux disease without esophagitis 08/19/2015  . IBS (irritable bowel syndrome) 08/19/2015  . Numbness and tingling of upper and lower extremities of both sides 04/14/2015  . Routine general medical examination at a health care facility 07/14/2014  . Vitamin B 12 deficiency 12/08/2008  . CARPAL TUNNEL SYNDROME 05/14/2007  . DE QUERVAIN'S TENOSYNOVITIS 05/14/2007  . Allergic rhinitis 01/31/2007    Current Outpatient Medications  Medication Sig Dispense Refill  . fluticasone (FLONASE) 50 MCG/ACT nasal spray Place 2 sprays into both nostrils daily. (Patient not taking: Reported on 01/18/2018) 16 g 1  . LO LOESTRIN FE 1 MG-10 MCG / 10 MCG tablet TK 1 T PO D UTD  12  . methocarbamol (ROBAXIN) 500 MG tablet Take 1 tablet (500 mg total) by mouth at bedtime as needed for muscle spasms. 30 tablet 0  . methylPREDNISolone (MEDROL DOSEPAK) 4 MG TBPK tablet Taper as directed 21 tablet 0  . terconazole (TERAZOL 3) 0.8 % vaginal cream I 1 APL INTRAVAGINALLY QHS  0   No current facility-administered medications for this visit.     Allergies: Bactrim ds [sulfamethoxazole-trimethoprim]; Cephalosporins; Oseltamivir phosphate; Amoxicillin; and Doxycycline  Past Medical History:  Diagnosis Date  . Allergic rhinitis   . Allergy   . B12 deficiency   . Carpal tunnel syndrome   . Dysfunction of eustachian tube     Past Surgical History:  Procedure Laterality Date  . BUNIONECTOMY     right   . CHOLECYSTECTOMY N/A 09/25/2015   Procedure: LAPAROSCOPIC CHOLECYSTECTOMY WITH INTRAOPERATIVE CHOLANGIOGRAM;  Surgeon: Alphonsa Overall, MD;  Location: [facility];  Service: General;  Laterality: N/A;  . TONSILLECTOMY      Family History  Problem Relation Age of Onset  . Heart disease Father   . Hypertension Father   . Diabetes Father   . Heart disease Maternal Grandmother   . Headache Neg Hx     Social History   Tobacco Use  . Smoking status: Never Smoker  . Smokeless tobacco: Never Used  Substance Use Topics  . Alcohol use: No    Subjective:  Patient presents with concerns for low back pain/ left buttock pain; no known injury or trauma; feels like symptoms are more consistent recently; history of spinal stenosis; no numbness or tingling; using Ibuprofen 800 2-3 x per week with limited benefit; in reviewing old records, had MRI in 2011 which showed spinal stenosis; patient does not remember any treatment;  Also requesting to get her thyroid level checked per her hematologist; concerned about other sources for low B12 level.     Objective:  Vitals:   01/18/18 1500  BP: 118/74  Pulse: 89  Temp: 98 F (36.7 C)  TempSrc: Oral  SpO2: 98%  Weight: 198 lb 1.9 oz (89.9 kg)  Height: 5\' 5"  (1.651 m)    General: Well developed, well nourished, in no acute distress  Skin : Warm and dry.  Head: Normocephalic and atraumatic  Lungs: Respirations unlabored;  Musculoskeletal: No deformities; no active joint inflammation  Extremities: No edema, cyanosis, clubbing  Vessels: Symmetric bilaterally  Neurologic: Alert and oriented; speech intact; face symmetrical; moves all extremities well; CNII-XII intact without focal deficit   Assessment:  1. Abnormal laboratory test result   2. Chronic left-sided low back pain, unspecified whether sciatica present     Plan:  1. Update thyroid studies per hematology request; 2. Update lumbar X-ray today; trial of Medrol Dose pak and Robaxin; follow-up to be determined.   No follow-ups on  file.  Orders Placed This Encounter  Procedures  . DG Lumbar Spine 2-3 Views    Standing Status:   Future    Number of Occurrences:   1    Standing Expiration Date:   03/22/2019    Order Specific Question:   Reason for Exam (SYMPTOM  OR DIAGNOSIS REQUIRED)    Answer:   low back pain    Order Specific Question:   Is patient pregnant?    Answer:   No    Order Specific Question:   Preferred imaging location?    Answer:   [facility]    Order Specific Question:   Radiology Contrast Protocol - do NOT remove file path    Answer:   [email or web address]  . Thyroid Panel With TSH    Requested Prescriptions   Signed Prescriptions Disp Refills  . methylPREDNISolone (MEDROL DOSEPAK) 4 MG TBPK tablet 21 tablet 0    Sig: Taper as directed  . methocarbamol (ROBAXIN) 500 MG tablet 30 tablet 0    Sig: Take 1 tablet (500 mg total) by mouth at bedtime as needed for muscle spasms.

## 2018-01-31 ENCOUNTER — Telehealth: Payer: Self-pay | Admitting: Internal Medicine

## 2018-01-31 NOTE — Telephone Encounter (Signed)
RR OUT 1/13 - MOVED TO [facility] 1/17. Left message. Schedule mailed.

## 2018-02-01 ENCOUNTER — Ambulatory Visit (INDEPENDENT_AMBULATORY_CARE_PROVIDER_SITE_OTHER): Payer: 59 | Admitting: *Deleted

## 2018-02-01 DIAGNOSIS — E538 Deficiency of other specified B group vitamins: Secondary | ICD-10-CM | POA: Diagnosis not present

## 2018-02-01 MED ORDER — CYANOCOBALAMIN 1000 MCG/ML IJ SOLN
1000.0000 ug | Freq: Once | INTRAMUSCULAR | Status: AC
  Administered 2018-02-01: 1000 ug via INTRAMUSCULAR

## 2018-02-01 NOTE — Progress Notes (Signed)
I have reviewed and agree.

## 2018-02-02 ENCOUNTER — Encounter: Payer: Self-pay | Admitting: Family

## 2018-02-02 ENCOUNTER — Ambulatory Visit: Payer: 59 | Admitting: Family

## 2018-02-02 ENCOUNTER — Other Ambulatory Visit (INDEPENDENT_AMBULATORY_CARE_PROVIDER_SITE_OTHER): Payer: 59

## 2018-02-02 ENCOUNTER — Ambulatory Visit: Payer: Self-pay

## 2018-02-02 VITALS — BP 116/72 | HR 89 | Temp 98.3°F | Ht 65.0 in

## 2018-02-02 DIAGNOSIS — R300 Dysuria: Secondary | ICD-10-CM

## 2018-02-02 DIAGNOSIS — E559 Vitamin D deficiency, unspecified: Secondary | ICD-10-CM

## 2018-02-02 LAB — POC URINALSYSI DIPSTICK (AUTOMATED)
Bilirubin, UA: NEGATIVE
Glucose, UA: NEGATIVE
Ketones, UA: NEGATIVE
Leukocytes, UA: NEGATIVE
Nitrite, UA: NEGATIVE
Protein, UA: NEGATIVE
RBC UA: POSITIVE
Spec Grav, UA: 1.015 (ref 1.010–1.025)
UROBILINOGEN UA: 0.2 U/dL
pH, UA: 6 (ref 5.0–8.0)

## 2018-02-02 LAB — VITAMIN D 25 HYDROXY (VIT D DEFICIENCY, FRACTURES): VITD: 37.02 ng/mL (ref 30.00–100.00)

## 2018-02-02 MED ORDER — NITROFURANTOIN MONOHYD MACRO 100 MG PO CAPS: 100.0000 mg | ORAL_CAPSULE | Freq: Two times a day (BID) | ORAL | 0 refills | Status: DC

## 2018-02-02 NOTE — Telephone Encounter (Signed)
Pt. reported onset of urinary symptoms on Monday, 01/29/18.  Has been on  Prednisone dose pak, and feels this has contributed to a UTI.  C/o increased frequency of urination, pain in bladder region, and burning with urination.  Denied fever or chills.  Reported urine is bright yellow, but takes a B12 vitamin daily.  Has not noted any odor with urination.  Appt. scheduled today at 3:20 PM.  Encouraged to increase oral fluids.  Pt. verb. understanding, and agrees with plan.         Reason for Disposition . Urinating more frequently than usual (i.e., frequency)  Answer Assessment - Initial Assessment Questions 1. SYMPTOM: "What's the main symptom you're concerned about?" (e.g., frequency, incontinence)     Frequency of urination and pain in bladder and with urination  2. ONSET: "When did the  sx's  start?"     On 01/29/18 3. PAIN: "Is there any pain?" If so, ask: "How bad is it?" (Scale: 1-10; mild, moderate, severe)     6/10 4. CAUSE: "What do you think is causing the symptoms?"     Thinks Prednisone has caused a UTI 5. OTHER SYMPTOMS: "Do you have any other symptoms?" (e.g., fever, flank pain, blood in urine, pain with urination)     Denied fever/ chills; no signs of blood in urine; urine is bright yellow with B12 complex 6. PREGNANCY: "Is there any chance you are pregnant?" "When was your last menstrual period?"     LMP; in December  Protocols used: URINARY [facility]

## 2018-02-02 NOTE — Progress Notes (Signed)
Rhonda [name] is a 48 y.o. female with the following history as recorded in EpicCare:  Patient Active Problem List   Diagnosis Date Noted  . Thrombocytosis ([place]) 11/06/2017  . Acute viral conjunctivitis of both eyes 10/12/2016  . Chiari malformation type II ([place]) 09/27/2016  . Abnormal MRI of head 03/07/2016  . Elevated blood sugar level 02/24/2016  . Visual disturbance 02/24/2016  . Gallstones and inflammation of gallbladder without obstruction 08/28/2015  . Gastroesophageal reflux disease without esophagitis 08/19/2015  . IBS (irritable bowel syndrome) 08/19/2015  . Numbness and tingling of upper and lower extremities of both sides 04/14/2015  . Routine general medical examination at a health care facility 07/14/2014  . Vitamin B 12 deficiency 12/08/2008  . CARPAL TUNNEL SYNDROME 05/14/2007  . DE QUERVAIN'S TENOSYNOVITIS 05/14/2007  . Allergic rhinitis 01/31/2007    Current Outpatient Medications  Medication Sig Dispense Refill  . LO LOESTRIN FE 1 MG-10 MCG / 10 MCG tablet TK 1 T PO D UTD  12  . methocarbamol (ROBAXIN) 500 MG tablet Take 1 tablet (500 mg total) by mouth at bedtime as needed for muscle spasms. 30 tablet 0  . methylPREDNISolone (MEDROL DOSEPAK) 4 MG TBPK tablet Taper as directed 21 tablet 0  . terconazole (TERAZOL 3) 0.8 % vaginal cream I 1 APL INTRAVAGINALLY QHS  0  . fluticasone (FLONASE) 50 MCG/ACT nasal spray Place 2 sprays into both nostrils daily. (Patient not taking: Reported on 01/18/2018) 16 g 1  . nitrofurantoin, macrocrystal-monohydrate, (MACROBID) 100 MG capsule Take 1 capsule (100 mg total) by mouth 2 (two) times daily. 14 capsule 0   No current facility-administered medications for this visit.     Allergies: Bactrim ds [sulfamethoxazole-trimethoprim]; Cephalosporins; Oseltamivir phosphate; Amoxicillin; and Doxycycline  Past Medical History:  Diagnosis Date  . Allergic rhinitis   . Allergy   . B12 deficiency   . Carpal tunnel syndrome   .  Dysfunction of eustachian tube     Past Surgical History:  Procedure Laterality Date  . BUNIONECTOMY     right   . CHOLECYSTECTOMY N/A 09/25/2015   Procedure: LAPAROSCOPIC CHOLECYSTECTOMY WITH INTRAOPERATIVE CHOLANGIOGRAM;  Surgeon: Alphonsa Overall, MD;  Location: [facility];  Service: General;  Laterality: N/A;  . TONSILLECTOMY      Family History  Problem Relation Age of Onset  . Heart disease Father   . Hypertension Father   . Diabetes Father   . Heart disease Maternal Grandmother   . Headache Neg Hx     Social History   Tobacco Use  . Smoking status: Never Smoker  . Smokeless tobacco: Never Used  Substance Use Topics  . Alcohol use: No    Subjective:  Patient presents with concerns for possible UTI; symptoms x 4-5 days; + urinary urgency, frequency; no blood in urine; no fever, no back pain; feels like recent treatment for sinus infection may have triggered symptoms.  Would also like to get her Vitamin D level re-checked today- just completed 12 weeks of treatment. Still working with hematology for chronic B12 deficiency.     Objective:  Vitals:   02/02/18 1526  BP: 116/72  Pulse: 89  Temp: 98.3 F (36.8 C)  TempSrc: Oral  SpO2: 97%  Height: 5\' 5"  (1.651 m)    General: Well developed, well nourished, in no acute distress  Skin : Warm and dry.  Head: Normocephalic and atraumatic  Lungs: Respirations unlabored; clear to auscultation bilaterally without wheeze, rales, rhonchi  Musculoskeletal: No deformities; no active joint inflammation;  negative CVA tenderness Extremities: No edema, cyanosis, clubbing  Vessels: Symmetric bilaterally  Neurologic: Alert and oriented; speech intact; face symmetrical; moves all extremities well; CNII-XII intact without focal deficit  Assessment:  1. Dysuria   2. Vitamin D deficiency     Plan:  1. Check U/A and urine culture; treat for UTI- Rx for Macrobid 100 mg bid x 7 days; 2. Re-check Vitamin D level today; follow-up to be  determined.    No follow-ups on file.  Orders Placed This Encounter  Procedures  . Urine Culture    Standing Status:   Future    Number of Occurrences:   1    Standing Expiration Date:   02/02/2019  . Vitamin D (25 hydroxy)    Standing Status:   Future    Number of Occurrences:   1    Standing Expiration Date:   02/02/2019  . POCT Urinalysis Dipstick (Automated)    Requested Prescriptions   Signed Prescriptions Disp Refills  . nitrofurantoin, macrocrystal-monohydrate, (MACROBID) 100 MG capsule 14 capsule 0    Sig: Take 1 capsule (100 mg total) by mouth 2 (two) times daily.

## 2018-02-03 LAB — URINE CULTURE
MICRO NUMBER:: 39338
SPECIMEN QUALITY: ADEQUATE

## 2018-02-05 ENCOUNTER — Other Ambulatory Visit: Payer: 59

## 2018-02-05 ENCOUNTER — Ambulatory Visit: Payer: 59 | Admitting: Hematology and Oncology

## 2018-02-08 ENCOUNTER — Other Ambulatory Visit (HOSPITAL_COMMUNITY): Payer: Self-pay | Admitting: Internal Medicine

## 2018-02-08 DIAGNOSIS — E538 Deficiency of other specified B group vitamins: Secondary | ICD-10-CM

## 2018-02-09 ENCOUNTER — Inpatient Hospital Stay: Payer: 59

## 2018-02-09 ENCOUNTER — Inpatient Hospital Stay: Payer: 59 | Attending: Hematology and Oncology | Admitting: Internal Medicine

## 2018-02-09 VITALS — BP 120/76 | HR 99 | Temp 98.0°F | Resp 18 | Ht 65.0 in | Wt 193.0 lb

## 2018-02-09 DIAGNOSIS — Z800 Family history of malignant neoplasm of digestive organs: Secondary | ICD-10-CM | POA: Insufficient documentation

## 2018-02-09 DIAGNOSIS — Z79899 Other long term (current) drug therapy: Secondary | ICD-10-CM | POA: Diagnosis not present

## 2018-02-09 DIAGNOSIS — R7989 Other specified abnormal findings of blood chemistry: Secondary | ICD-10-CM | POA: Insufficient documentation

## 2018-02-09 DIAGNOSIS — B301 Conjunctivitis due to adenovirus: Secondary | ICD-10-CM | POA: Diagnosis not present

## 2018-02-09 DIAGNOSIS — K219 Gastro-esophageal reflux disease without esophagitis: Secondary | ICD-10-CM

## 2018-02-09 DIAGNOSIS — E538 Deficiency of other specified B group vitamins: Secondary | ICD-10-CM | POA: Diagnosis present

## 2018-02-09 LAB — VITAMIN B12: Vitamin B-12: 959 pg/mL — ABNORMAL HIGH (ref 180–914)

## 2018-02-09 LAB — CBC WITH DIFFERENTIAL/PLATELET
Abs Immature Granulocytes: 0.02 10*3/uL (ref 0.00–0.07)
Basophils Absolute: 0 10*3/uL (ref 0.0–0.1)
Basophils Relative: 0 %
EOS PCT: 1 %
Eosinophils Absolute: 0.1 10*3/uL (ref 0.0–0.5)
HCT: 39.9 % (ref 36.0–46.0)
Hemoglobin: 13.7 g/dL (ref 12.0–15.0)
Immature Granulocytes: 0 %
LYMPHS ABS: 2.7 10*3/uL (ref 0.7–4.0)
Lymphocytes Relative: 35 %
MCH: 31.1 pg (ref 26.0–34.0)
MCHC: 34.3 g/dL (ref 30.0–36.0)
MCV: 90.5 fL (ref 80.0–100.0)
Monocytes Absolute: 0.4 10*3/uL (ref 0.1–1.0)
Monocytes Relative: 5 %
NRBC: 0 % (ref 0.0–0.2)
Neutro Abs: 4.6 10*3/uL (ref 1.7–7.7)
Neutrophils Relative %: 59 %
Platelets: 368 10*3/uL (ref 150–400)
RBC: 4.41 MIL/uL (ref 3.87–5.11)
RDW: 13.2 % (ref 11.5–15.5)
WBC: 7.8 10*3/uL (ref 4.0–10.5)

## 2018-02-09 LAB — COMPREHENSIVE METABOLIC PANEL
ALT: 22 U/L (ref 0–44)
AST: 18 U/L (ref 15–41)
Albumin: 3.6 g/dL (ref 3.5–5.0)
Alkaline Phosphatase: 110 U/L (ref 38–126)
Anion gap: 10 (ref 5–15)
BUN: 6 mg/dL (ref 6–20)
CO2: 26 mmol/L (ref 22–32)
CREATININE: 0.93 mg/dL (ref 0.44–1.00)
Calcium: 9 mg/dL (ref 8.9–10.3)
Chloride: 103 mmol/L (ref 98–111)
GFR calc Af Amer: >60 mL/min (ref >60)
GFR calc non Af Amer: >60 mL/min (ref >60)
Glucose, Bld: 144 mg/dL — ABNORMAL HIGH (ref 70–99)
Potassium: 3.6 mmol/L (ref 3.5–5.1)
Sodium: 139 mmol/L (ref 135–145)
Total Bilirubin: 1.6 mg/dL — ABNORMAL HIGH (ref 0.3–1.2)
Total Protein: 7.3 g/dL (ref 6.5–8.1)

## 2018-02-09 LAB — LACTATE DEHYDROGENASE: LDH: 151 U/L (ref 98–192)

## 2018-02-09 NOTE — Progress Notes (Signed)
Diagnosis B12 nutritional deficiency - Plan: CBC with Differential/Platelet, Comprehensive metabolic panel, Lactate dehydrogenase, Protein electrophoresis, serum, Ferritin, Methylmalonic acid, serum, Vitamin B12, Folate, Ambulatory referral to Gastroenterology  Gastroesophageal reflux disease, esophagitis presence not specified - Plan: Ambulatory referral to Gastroenterology  Staging Cancer Staging No matching staging information was found for the patient.  Assessment and Plan:  1.  Vitamin B12 deficiency and transient thrombocytosis.  Pt was previously followed by Dr. Audelia Hives.  She is on monthly B12 and is also on oral supplementation.  She reports problems with reflux, she presents now for the results of her preliminary evaluation and recommendations.  Labs done 02/09/2018 reviewed and showed WBC 7.8 HB 13.7 plts 368,000.  Chemistries WNL with K+ 3.6 Cr 0.93 and normal LFTs.  B12 959.   Ferritin 81 in October.  I have reassured pt today that HB is WNL at 13.7 and plt count is normal at 368,000.  She will continue monthly B12 and RTC in 04/2018 for follow-up with labs.    2.  Reflux and family history of CRC  Pt is referred to GI for evaluation.    3.  Health maintenance.  Pt is referred to GI for evaluation.    Current Status:  Pt is seen today for follow-up.  She is here to go over labs.  She reports problems with reflux.  She has family history of colon cancer in uncle diagnosed > age of 15.     Problem List Patient Active Problem List   Diagnosis Date Noted  . Thrombocytosis ([place]) [D47.3] 11/06/2017  . Acute viral conjunctivitis of both eyes [B30.9] 10/12/2016  . Chiari malformation type II ([place]) [Q07.01] 09/27/2016  . Abnormal MRI of head [R93.0] 03/07/2016  . Elevated blood sugar level [R73.9] 02/24/2016  . Visual disturbance [H53.9] 02/24/2016  . Gallstones and inflammation of gallbladder without obstruction [K80.00] 08/28/2015  . Gastroesophageal reflux disease without  esophagitis [K21.9] 08/19/2015  . IBS (irritable bowel syndrome) [K58.9] 08/19/2015  . Numbness and tingling of upper and lower extremities of both sides [R20.0, R20.2] 04/14/2015  . Routine general medical examination at a health care facility [Z00.00] 07/14/2014  . Vitamin B 12 deficiency [E53.8] 12/08/2008  . CARPAL TUNNEL SYNDROME [G56.00] 05/14/2007  . DE QUERVAIN'S TENOSYNOVITIS [M65.4] 05/14/2007  . Allergic rhinitis [J30.9] 01/31/2007    Past Medical History Past Medical History:  Diagnosis Date  . Allergic rhinitis   . Allergy   . B12 deficiency   . Carpal tunnel syndrome   . Dysfunction of eustachian tube     Past Surgical History Past Surgical History:  Procedure Laterality Date  . BUNIONECTOMY     right   . CHOLECYSTECTOMY N/A 09/25/2015   Procedure: LAPAROSCOPIC CHOLECYSTECTOMY WITH INTRAOPERATIVE CHOLANGIOGRAM;  Surgeon: Alphonsa Overall, MD;  Location: [facility];  Service: General;  Laterality: N/A;  . TONSILLECTOMY      Family History Family History  Problem Relation Age of Onset  . Heart disease Father   . Hypertension Father   . Diabetes Father   . Heart disease Maternal Grandmother   . Headache Neg Hx      Social History  reports that she has never smoked. She has never used smokeless tobacco. She reports that she does not drink alcohol or use drugs.  Medications  Current Outpatient Medications:  .  cyanocobalamin 1000 MCG tablet, Take 1,000 mcg by mouth daily., Disp: , Rfl:  .  methocarbamol (ROBAXIN) 500 MG tablet, Take 1 tablet (500 mg total) by mouth at  bedtime as needed for muscle spasms. (Patient not taking: Reported on 02/09/2018), Disp: 30 tablet, Rfl: 0 .  nitrofurantoin, macrocrystal-monohydrate, (MACROBID) 100 MG capsule, Take 1 capsule (100 mg total) by mouth 2 (two) times daily., Disp: 14 capsule, Rfl: 0  Allergies Bactrim ds [sulfamethoxazole-trimethoprim]; Cephalosporins; Oseltamivir phosphate; Amoxicillin; and Doxycycline  Review of  Systems Review of Systems - Oncology ROS negative   Physical Exam  Vitals Wt Readings from Last 3 Encounters:  02/09/18 193 lb (87.5 kg)  01/18/18 198 lb 1.9 oz (89.9 kg)  11/20/17 194 lb 11.2 oz (88.3 kg)   Temp Readings from Last 3 Encounters:  02/09/18 98 F (36.7 C) (Oral)  02/02/18 98.3 F (36.8 C) (Oral)  01/18/18 98 F (36.7 C) (Oral)   BP Readings from Last 3 Encounters:  02/09/18 120/76  02/02/18 116/72  01/18/18 118/74   Pulse Readings from Last 3 Encounters:  02/09/18 99  02/02/18 89  01/18/18 89   Constitutional: Well-developed, well-nourished, and in no distress.   HENT: Head: Normocephalic and atraumatic.  Mouth/Throat: No oropharyngeal exudate. Mucosa moist. Eyes: Pupils are equal, round, and reactive to light. Conjunctivae are normal. No scleral icterus.  Neck: Normal range of motion. Neck supple. No JVD present.  Cardiovascular: Normal rate, regular rhythm and normal heart sounds.  Exam reveals no gallop and no friction rub.   No murmur heard. Pulmonary/Chest: Effort normal and breath sounds normal. No respiratory distress. No wheezes.No rales.  Abdominal: Soft. Bowel sounds are normal. No distension. There is no tenderness. There is no guarding.  Musculoskeletal: No edema or tenderness.  Lymphadenopathy: No cervical, axillary or supraclavicular adenopathy.  Neurological: Alert and oriented to person, place, and time. No cranial nerve deficit.  Skin: Skin is warm and dry. No rash noted. No erythema. No pallor.  Psychiatric: Affect and judgment normal.   Labs Appointment on 02/09/2018  Component Date Value Ref Range Status  . Vitamin B-12 02/09/2018 959* 180 - 914 pg/mL Final   Comment: (NOTE) This assay is not validated for testing neonatal or myeloproliferative syndrome specimens for Vitamin B12 levels. Performed at [facility], [place] [address] [place], [place] [address]   . LDH 02/09/2018 151  98 - 192 U/L Final    Performed at [facility], [place] [address], [place],  [address]  . Sodium 02/09/2018 139  135 - 145 mmol/L Final  . Potassium 02/09/2018 3.6  3.5 - 5.1 mmol/L Final  . Chloride 02/09/2018 103  98 - 111 mmol/L Final  . CO2 02/09/2018 26  22 - 32 mmol/L Final  . Glucose, Bld 02/09/2018 144* 70 - 99 mg/dL Final  . BUN 02/09/2018 6  6 - 20 mg/dL Final  . Creatinine, Ser 02/09/2018 0.93  0.44 - 1.00 mg/dL Final  . Calcium 02/09/2018 9.0  8.9 - 10.3 mg/dL Final  . Total Protein 02/09/2018 7.3  6.5 - 8.1 g/dL Final  . Albumin 02/09/2018 3.6  3.5 - 5.0 g/dL Final  . AST 02/09/2018 18  15 - 41 U/L Final  . ALT 02/09/2018 22  0 - 44 U/L Final  . Alkaline Phosphatase 02/09/2018 110  38 - 126 U/L Final  . Total Bilirubin 02/09/2018 1.6* 0.3 - 1.2 mg/dL Final  . GFR calc non Af Amer 02/09/2018 >60  >60 mL/min Final  . GFR calc Af Amer 02/09/2018 >60  >60 mL/min Final  . Anion gap 02/09/2018 10  5 - 15 Final   Performed at [facility], [place] [address], [place], [place] [address]  . WBC 02/09/2018 7.8  4.0 - 10.5 K/uL Final  . RBC 02/09/2018 4.41  3.87 - 5.11 MIL/uL Final  . Hemoglobin 02/09/2018 13.7  12.0 - 15.0 g/dL Final  . HCT 02/09/2018 39.9  36.0 - 46.0 % Final  . MCV 02/09/2018 90.5  80.0 - 100.0 fL Final  . MCH 02/09/2018 31.1  26.0 - 34.0 pg Final  . MCHC 02/09/2018 34.3  30.0 - 36.0 g/dL Final  . RDW 02/09/2018 13.2  11.5 - 15.5 % Final  . Platelets 02/09/2018 368  150 - 400 K/uL Final  . nRBC 02/09/2018 0.0  0.0 - 0.2 % Final  . Neutrophils Relative % 02/09/2018 59  % Final  . Neutro Abs 02/09/2018 4.6  1.7 - 7.7 K/uL Final  . Lymphocytes Relative 02/09/2018 35  % Final  . Lymphs Abs 02/09/2018 2.7  0.7 - 4.0 K/uL Final  . Monocytes Relative 02/09/2018 5  % Final  . Monocytes Absolute 02/09/2018 0.4  0.1 - 1.0 K/uL Final  . Eosinophils Relative 02/09/2018 1  % Final  . Eosinophils Absolute 02/09/2018 0.1  0.0 - 0.5 K/uL Final   . Basophils Relative 02/09/2018 0  % Final  . Basophils Absolute 02/09/2018 0.0  0.0 - 0.1 K/uL Final  . Immature Granulocytes 02/09/2018 0  % Final  . Abs Immature Granulocytes 02/09/2018 0.02  0.00 - 0.07 K/uL Final   Performed at [facility], [place] [address], [place],  [address]     Pathology Orders Placed This Encounter  Procedures  . CBC with Differential/Platelet    Standing Status:   Future    Standing Expiration Date:   02/10/2019  . Comprehensive metabolic panel    Standing Status:   Future    Standing Expiration Date:   02/10/2019  . Lactate dehydrogenase    Standing Status:   Future    Standing Expiration Date:   02/10/2019  . Protein electrophoresis, serum    Standing Status:   Future    Standing Expiration Date:   02/10/2019  . Ferritin    Standing Status:   Future    Standing Expiration Date:   02/10/2019  . Methylmalonic acid, serum    Standing Status:   Future    Standing Expiration Date:   02/10/2019  . Vitamin B12    Standing Status:   Future    Standing Expiration Date:   02/10/2019  . Folate    Standing Status:   Future    Standing Expiration Date:   02/10/2019  . Ambulatory referral to Gastroenterology    Referral Priority:   Routine    Referral Type:   Consultation    Referral Reason:   Specialty Services Required    Number of Visits Requested:   1       Zoila Shutter MD

## 2018-02-12 LAB — FERRITIN: Ferritin: 112 ng/mL (ref 11–307)

## 2018-02-13 ENCOUNTER — Encounter: Payer: Self-pay | Admitting: Internal Medicine

## 2018-02-14 LAB — METHYLMALONIC ACID, SERUM: Methylmalonic Acid, Quantitative: 68 nmol/L (ref 0–378)

## 2018-02-19 ENCOUNTER — Telehealth: Payer: Self-pay

## 2018-02-19 ENCOUNTER — Other Ambulatory Visit: Payer: Self-pay | Admitting: Family

## 2018-02-19 DIAGNOSIS — M4807 Spinal stenosis, lumbosacral region: Secondary | ICD-10-CM

## 2018-02-19 NOTE — Telephone Encounter (Signed)
Copied from [place] [id number]. Topic: General - Other >> Feb 19, 2018 11:07 AM Carolyn Stare wrote:  Pt saw Jodi Mourning in 01/18/2018 for left leg pain and was told if it did not get better to call and she would schedule an MRI

## 2018-02-19 NOTE — Telephone Encounter (Signed)
Sent patient message via mychart.

## 2018-02-19 NOTE — Telephone Encounter (Signed)
Order done as requested; someone will be contacting her to schedule.

## 2018-02-20 NOTE — Telephone Encounter (Signed)
Message read by patient yesterday.

## 2018-02-26 ENCOUNTER — Other Ambulatory Visit: Payer: Self-pay | Admitting: Family

## 2018-02-26 DIAGNOSIS — E538 Deficiency of other specified B group vitamins: Secondary | ICD-10-CM

## 2018-03-02 ENCOUNTER — Ambulatory Visit
Admission: RE | Admit: 2018-03-02 | Discharge: 2018-03-02 | Disposition: A | Discharge status: Home / Self Care | Payer: 59 | Source: Ambulatory Visit | Attending: Family | Admitting: Family

## 2018-03-02 ENCOUNTER — Other Ambulatory Visit: Payer: Self-pay | Admitting: Family

## 2018-03-02 ENCOUNTER — Other Ambulatory Visit (INDEPENDENT_AMBULATORY_CARE_PROVIDER_SITE_OTHER): Payer: 59

## 2018-03-02 DIAGNOSIS — M5126 Other intervertebral disc displacement, lumbar region: Secondary | ICD-10-CM

## 2018-03-02 DIAGNOSIS — E538 Deficiency of other specified B group vitamins: Secondary | ICD-10-CM | POA: Diagnosis not present

## 2018-03-02 DIAGNOSIS — M545 Low back pain: Secondary | ICD-10-CM | POA: Diagnosis not present

## 2018-03-02 DIAGNOSIS — M5416 Radiculopathy, lumbar region: Secondary | ICD-10-CM

## 2018-03-02 DIAGNOSIS — M4807 Spinal stenosis, lumbosacral region: Secondary | ICD-10-CM

## 2018-03-02 DIAGNOSIS — R937 Abnormal findings on diagnostic imaging of other parts of musculoskeletal system: Secondary | ICD-10-CM

## 2018-03-02 LAB — VITAMIN B12: Vitamin B-12: 363 pg/mL (ref 211–911)

## 2018-03-06 ENCOUNTER — Ambulatory Visit: Payer: 59 | Admitting: Internal Medicine

## 2018-03-08 ENCOUNTER — Telehealth: Payer: Self-pay | Admitting: Internal Medicine

## 2018-03-08 NOTE — Telephone Encounter (Signed)
Scheduled appt per staff message from Dr. Walden Field ;  Higgs, Mathis Dad, MD  [name]        If she has b12 done at other office she can continue that there. She should follow-up in 04/2018 at [facility] with MD and labs    Pt is aware of appt date and time

## 2018-04-12 ENCOUNTER — Ambulatory Visit (INDEPENDENT_AMBULATORY_CARE_PROVIDER_SITE_OTHER): Payer: 59 | Admitting: *Deleted

## 2018-04-12 ENCOUNTER — Other Ambulatory Visit: Payer: Self-pay

## 2018-04-12 DIAGNOSIS — E538 Deficiency of other specified B group vitamins: Secondary | ICD-10-CM | POA: Diagnosis not present

## 2018-04-12 MED ORDER — CYANOCOBALAMIN 1000 MCG/ML IJ SOLN
1000.0000 ug | Freq: Once | INTRAMUSCULAR | Status: AC
  Administered 2018-04-12: 1000 ug via INTRAMUSCULAR

## 2018-04-12 NOTE — Progress Notes (Addendum)
Pls co-sign for B12.Marland KitchenJohny Chess  I have reviewed and agree

## 2018-04-18 ENCOUNTER — Encounter: Payer: Self-pay | Admitting: Internal Medicine

## 2018-04-24 ENCOUNTER — Ambulatory Visit: Payer: Self-pay

## 2018-04-24 NOTE — Telephone Encounter (Signed)
Pt called stating that she is achy all over and has a mild dry cough. She lives in [place] with little known COVID-19.  She works with [employer] in [place] and is exposed to public all day.  She state she has no fever. She is chilled. She has headache and stuffy nose.  She has no Chest pain or SOB.She states her face hurts from possible allergies. Home care instructions for possible COVID-19 read to patient. Pt verbalized understanding of instructions and will go home.  She is at work. She will call back if symptoms worsen.  Reason for Disposition . 1] COVID-19 infection diagnosed or suspected AND [2] mild symptoms (fever, cough) AND [2] no trouble breathing or other complications  Answer Assessment - Initial Assessment Questions 1. COVID-19 DIAGNOSIS: "Who made your Coronavirus (COVID-19) diagnosis?" "Was it confirmed by a positive lab test?" If not diagnosed by a HCP, ask "Are there lots of cases (community spread) where you live?" (See public health department website, if unsure)   * MAJOR community spread: high number of cases; numbers of cases are increasing; many people hospitalized.   * MINOR community spread: low number of cases; not increasing; few or no people hospitalized     Works with public in [place] not a lot of cases in [place] 2. ONSET: "When did the COVID-19 symptoms start?"      Last night started with Dry cough achy, chills,headache 3. WORST SYMPTOM: "What is your worst symptom?" (e.g., cough, fever, shortness of breath, muscle aches)    Muscle aches 4. COUGH: "How bad is the cough?"       Mild dry like a tickle 5. FEVER: "Do you have a fever?" If so, ask: "What is your temperature, how was it measured, and when did it start?"    No fever 6. RESPIRATORY STATUS: "Describe your breathing?" (e.g., shortness of breath No 7. BETTER-SAME-WORSE: "Are you getting better, staying the same or getting worse compared to yesterday?"  If getting worse, ask, "In what way?"   The  achyness 8. HIGH RISK DISEASE: "Do you have any chronic medical problems?" (e.g., asthma, heart or lung disease, weak immune system, etc.)     no 9. PREGNANCY: "Is there any chance you are pregnant?" "When was your last menstrual period?"     No should be this week 10. OTHER SYMPTOMS: "Do you have any other symptoms?"  (e.g., runny nose, headache, sore throat, loss of smell)      Headache, stuffy nose  Protocols used: CORONAVIRUS (COVID-19) DIAGNOSED OR SUSPECTED-A-AH

## 2018-04-24 NOTE — Telephone Encounter (Signed)
Routing to dr jones, fyi.... 

## 2018-04-26 ENCOUNTER — Telehealth: Payer: Self-pay

## 2018-04-26 NOTE — Telephone Encounter (Signed)
Yes, a note is fine with me

## 2018-04-26 NOTE — Telephone Encounter (Signed)
Contacted pt. Sx (fever, cough, headache) started 04/19/2018. All sx but cough stopped 04/25/2018. Pt is wanting to go back to work. Informed pt that 05/03/2018 would be 2 wks.   Pt is wanting a note to go back sooner. Please advise if you approve.

## 2018-04-26 NOTE — Telephone Encounter (Signed)
Note printed and signed by PCP.   Email pt via copy machine.   Pt informed to let us know if the attached is unclear.

## 2018-04-26 NOTE — Telephone Encounter (Signed)
Copied from [place] [id number]. Topic: General - Inquiry >> Apr 26, 2018 11:11 AM Margot Ables wrote: Reason for CRM: Pt called stating she is now having no COVID symptoms. She reports 3/26 symptoms started of cough, stuffy nose and headache. Pt has not had chest pains, sob, or fever. Pt notes she is not coughing much, she has not had a fever at all, headache went away yesterday. Pt employer needing a note to approve her to return to work. Please advise.

## 2018-04-27 ENCOUNTER — Other Ambulatory Visit: Payer: Self-pay

## 2018-04-27 ENCOUNTER — Encounter: Payer: Self-pay | Admitting: Family

## 2018-04-27 ENCOUNTER — Ambulatory Visit (INDEPENDENT_AMBULATORY_CARE_PROVIDER_SITE_OTHER): Payer: 59 | Admitting: Family

## 2018-04-27 DIAGNOSIS — K219 Gastro-esophageal reflux disease without esophagitis: Secondary | ICD-10-CM | POA: Diagnosis not present

## 2018-04-27 DIAGNOSIS — B349 Viral infection, unspecified: Secondary | ICD-10-CM | POA: Diagnosis not present

## 2018-04-27 MED ORDER — ESOMEPRAZOLE MAGNESIUM 40 MG PO CPDR: 40.0000 mg | DELAYED_RELEASE_CAPSULE | Freq: Every day | ORAL | 0 refills | Status: DC

## 2018-04-27 NOTE — Progress Notes (Signed)
Rhonda [name] is a 48 y.o. female with the following history as recorded in EpicCare:  Patient Active Problem List   Diagnosis Date Noted  . Thrombocytosis ([place]) 11/06/2017  . Acute viral conjunctivitis of both eyes 10/12/2016  . Chiari malformation type II ([place]) 09/27/2016  . Abnormal MRI of head 03/07/2016  . Elevated blood sugar level 02/24/2016  . Visual disturbance 02/24/2016  . Gallstones and inflammation of gallbladder without obstruction 08/28/2015  . Gastroesophageal reflux disease without esophagitis 08/19/2015  . IBS (irritable bowel syndrome) 08/19/2015  . Numbness and tingling of upper and lower extremities of both sides 04/14/2015  . Routine general medical examination at a health care facility 07/14/2014  . Vitamin B 12 deficiency 12/08/2008  . CARPAL TUNNEL SYNDROME 05/14/2007  . DE QUERVAIN'S TENOSYNOVITIS 05/14/2007  . Allergic rhinitis 01/31/2007    Current Outpatient Medications  Medication Sig Dispense Refill  . esomeprazole (NEXIUM) 20 MG capsule Take 20 mg by mouth daily at 12 noon.    . cyanocobalamin 1000 MCG tablet Take 1,000 mcg by mouth daily.     No current facility-administered medications for this visit.     Allergies: Bactrim ds [sulfamethoxazole-trimethoprim]; Cephalosporins; Oseltamivir phosphate; Amoxicillin; and Doxycycline  Past Medical History:  Diagnosis Date  . Allergic rhinitis   . Allergy   . B12 deficiency   . Carpal tunnel syndrome   . Dysfunction of eustachian tube     Past Surgical History:  Procedure Laterality Date  . BUNIONECTOMY     right   . CHOLECYSTECTOMY N/A 09/25/2015   Procedure: LAPAROSCOPIC CHOLECYSTECTOMY WITH INTRAOPERATIVE CHOLANGIOGRAM;  Surgeon: Alphonsa Overall, MD;  Location: [facility];  Service: General;  Laterality: N/A;  . TONSILLECTOMY      Family History  Problem Relation Age of Onset  . Heart disease Father   . Hypertension Father   . Diabetes Father   . Heart disease Maternal Grandmother   . Headache  Neg Hx     Social History   Tobacco Use  . Smoking status: Never Smoker  . Smokeless tobacco: Never Used  Substance Use Topics  . Alcohol use: No    Subjective:  I connected with Rhonda [name] on 04/27/18 at  1:20 PM EDT by a video enabled telemedicine application and verified that I am speaking with the correct person using two identifiers.   I discussed the limitations of evaluation and management by telemedicine and the availability of in person appointments. The patient expressed understanding and agreed to proceed.  Patient presents with concerns for abdominal pain x 4 days; notes that her stomach is "rolling"; does have history of GERD- only taking Nexium as needed; admits that stress level is very high concerning COVID 19 scare- has to return to work on Monday; had one episode of diarrhea yesterday- no blood in stool; s/p cholecystectomy; denies any fever; denies any abdominal pain; no changes in urination;  Notes that on Tuesday of this week was sent home from work with concerns for possible COVID 19 symptoms- body aches, chills, "bad headache" and cough; notes that symptoms are improving but is concerned about returning to work too quickly with concerns for COVID- 19; has not had any further headaches or chills or body aches; understands that recommended to quarantine at home for 14 days from time of suspected symptoms; needs updated work note;      Objective: No visual available for current visit/ only audio  General:  in no acute distress  Lungs: Respirations unlabored;  Neurologic:  Alert and oriented; speech intact; face symmetrical;  Assessment:  1. Gastroesophageal reflux disease without esophagitis   2. Viral illness     Plan:  Suspect stress from current pandemic is aggravating underlying GERD; will increase Nexium to 40 mg daily; prescription updated; Discussed presenting symptoms that patient had earlier this week- agree that concerning for possible COVID  illness; agree that patient should not return to work for another week; work note will be updated.   No follow-ups on file.  No orders of the defined types were placed in this encounter.   Requested Prescriptions    No prescriptions requested or ordered in this encounter

## 2018-04-27 NOTE — Telephone Encounter (Signed)
Pt called in to request an updated work note. Pt says that she had a web visit with Jodi Mourning today and was advised to stay out the entire 14 days to quarantine. Pt would like to know if her work note is able to give details? If not pt understands due to HIPAA. Pt also would like to have the note reflect her original date of start which is 04/24/18. Pt says her current note shows today's date.   Please advise.

## 2018-04-30 ENCOUNTER — Telehealth: Payer: Self-pay | Admitting: *Deleted

## 2018-04-30 NOTE — Telephone Encounter (Signed)
Letter completed and faxed. Pt informed of same.

## 2018-04-30 NOTE — Telephone Encounter (Signed)
Received vm message from patient stating that she needs to get her B12 injection here as her PCP office is limiting patients there d/t corona virus concerns. She would like to get her injection, labs and MD visit on the same day. Next B12 injection is due around April 20th. Scheduling message sent.

## 2018-04-30 NOTE — Telephone Encounter (Signed)
Patient saw Mickel Baas on 04/27/18 for a virtual visit.  She was told to self quarantine per Mickel Baas for 14 days (patient was displaying possible symptoms of COVID-19). Her original symptoms started on 4/31/20 when she was sent home from work. Mickel Baas did note already but patient now needs updated note to reflect her being out from 04/24/18 - 05/04/18 and she can return on 05/07/18. She stated it needed to be signed by Dr. Ronnald Ramp and she would like it sent to her via my-chart. Can you please address her note since Mickel Baas is out this week and she said note needs to be from a provider.  Thanks

## 2018-05-02 ENCOUNTER — Encounter: Payer: Self-pay | Admitting: Internal Medicine

## 2018-05-02 NOTE — Telephone Encounter (Signed)
I sent schedule request for rescheduled appt.  Cancelled appts for this week

## 2018-05-03 ENCOUNTER — Inpatient Hospital Stay: Payer: 59 | Admitting: Internal Medicine

## 2018-05-03 ENCOUNTER — Inpatient Hospital Stay: Payer: 59

## 2018-05-09 ENCOUNTER — Encounter: Payer: Self-pay | Admitting: Internal Medicine

## 2018-05-09 DIAGNOSIS — Z0279 Encounter for issue of other medical certificate: Secondary | ICD-10-CM

## 2018-05-09 DIAGNOSIS — M5416 Radiculopathy, lumbar region: Secondary | ICD-10-CM | POA: Diagnosis not present

## 2018-05-09 NOTE — Telephone Encounter (Signed)
Forms have been completed & signed, faxed, copy sent to scan, &charged for.  Patient informed, original mailed to patient.

## 2018-05-14 ENCOUNTER — Ambulatory Visit: Payer: 59

## 2018-05-16 ENCOUNTER — Other Ambulatory Visit: Payer: Self-pay | Admitting: Internal Medicine

## 2018-05-17 ENCOUNTER — Other Ambulatory Visit: Payer: Self-pay

## 2018-05-17 ENCOUNTER — Inpatient Hospital Stay: Payer: 59

## 2018-05-17 ENCOUNTER — Encounter: Payer: Self-pay | Admitting: Internal Medicine

## 2018-05-17 ENCOUNTER — Inpatient Hospital Stay: Payer: 59 | Attending: Internal Medicine | Admitting: Internal Medicine

## 2018-05-17 VITALS — BP 129/69 | HR 82 | Temp 98.6°F | Resp 18 | Ht 65.0 in | Wt 199.0 lb

## 2018-05-17 DIAGNOSIS — E538 Deficiency of other specified B group vitamins: Secondary | ICD-10-CM

## 2018-05-17 DIAGNOSIS — D473 Essential (hemorrhagic) thrombocythemia: Secondary | ICD-10-CM | POA: Insufficient documentation

## 2018-05-17 LAB — COMPREHENSIVE METABOLIC PANEL
ALT: 13 U/L (ref 0–44)
AST: 16 U/L (ref 15–41)
Albumin: 3.5 g/dL (ref 3.5–5.0)
Alkaline Phosphatase: 95 U/L (ref 38–126)
Anion gap: 11 (ref 5–15)
BUN: 9 mg/dL (ref 6–20)
CO2: 24 mmol/L (ref 22–32)
Calcium: 8.7 mg/dL — ABNORMAL LOW (ref 8.9–10.3)
Chloride: 105 mmol/L (ref 98–111)
Creatinine, Ser: 0.9 mg/dL (ref 0.44–1.00)
GFR calc Af Amer: >60 mL/min (ref >60)
GFR calc non Af Amer: >60 mL/min (ref >60)
Glucose, Bld: 98 mg/dL (ref 70–99)
Potassium: 3.9 mmol/L (ref 3.5–5.1)
Sodium: 140 mmol/L (ref 135–145)
Total Bilirubin: 0.8 mg/dL (ref 0.3–1.2)
Total Protein: 7.3 g/dL (ref 6.5–8.1)

## 2018-05-17 LAB — CBC WITH DIFFERENTIAL/PLATELET
Abs Immature Granulocytes: 0.01 10*3/uL (ref 0.00–0.07)
Basophils Absolute: 0 10*3/uL (ref 0.0–0.1)
Basophils Relative: 1 %
Eosinophils Absolute: 0.1 10*3/uL (ref 0.0–0.5)
Eosinophils Relative: 1 %
HCT: 39.6 % (ref 36.0–46.0)
Hemoglobin: 13.4 g/dL (ref 12.0–15.0)
Immature Granulocytes: 0 %
Lymphocytes Relative: 27 %
Lymphs Abs: 1.8 10*3/uL (ref 0.7–4.0)
MCH: 30.2 pg (ref 26.0–34.0)
MCHC: 33.8 g/dL (ref 30.0–36.0)
MCV: 89.2 fL (ref 80.0–100.0)
Monocytes Absolute: 0.5 10*3/uL (ref 0.1–1.0)
Monocytes Relative: 7 %
Neutro Abs: 4.1 10*3/uL (ref 1.7–7.7)
Neutrophils Relative %: 64 %
Platelets: 353 10*3/uL (ref 150–400)
RBC: 4.44 MIL/uL (ref 3.87–5.11)
RDW: 13.1 % (ref 11.5–15.5)
WBC: 6.4 10*3/uL (ref 4.0–10.5)
nRBC: 0 % (ref 0.0–0.2)

## 2018-05-17 LAB — VITAMIN B12: Vitamin B-12: 234 pg/mL (ref 180–914)

## 2018-05-17 LAB — FERRITIN: Ferritin: 67 ng/mL (ref 11–307)

## 2018-05-17 LAB — LACTATE DEHYDROGENASE: LDH: 137 U/L (ref 98–192)

## 2018-05-17 LAB — FOLATE: Folate: 10.9 ng/mL (ref >5.9)

## 2018-05-17 MED ORDER — CYANOCOBALAMIN 1000 MCG/ML IJ SOLN
INTRAMUSCULAR | Status: AC
  Filled 2018-05-17: qty 1

## 2018-05-17 MED ORDER — CYANOCOBALAMIN 1000 MCG/ML IJ SOLN
1000.0000 ug | Freq: Once | INTRAMUSCULAR | Status: AC
  Administered 2018-05-17: 1000 ug via INTRAMUSCULAR

## 2018-05-17 NOTE — Progress Notes (Signed)
Diagnosis B12 nutritional deficiency - Plan: Ambulatory referral to Gastroenterology, CBC with Differential ([place] Only), CMP ([place] only), Lactate dehydrogenase (LDH)  Staging Cancer Staging No matching staging information was found for the patient.  Assessment and Plan:  1.  Vitamin B12 deficiency and transient thrombocytosis.  Pt was previously followed by Dr. Audelia Hives.  She is on monthly B12 and is also on oral supplementation.  She reports problems with reflux.    Labs done today 05/17/2018 reviewed and showed WBC 6.4 HB 13.4 plts 353,000.  Platelet count is WNL.  Chemistries WNL with K+ 3.9 Cr 0.90 and normal LFTs.  Ferritin WNL at 67.  B12 WNL.  Pt will continue monthly B12 injections.  She is referred to GI for evaluation.  She will RTC in 10/2018 with labs and follow-up.    2.  Reflux and family history of CRC  Pt is referred to GI for evaluation.    3.  Health maintenance.  Pt is referred to GI for evaluation.    25 minutes spent with more than 50% spent in review of records, counseling and coordination of care.    Current Status:  Pt is seen today for follow-up.  She is here to go over labs and for evaluation prior to B12.  She reports some problems with indigestion. She has family history of colon cancer in uncle diagnosed > age of 9.    Problem List Patient Active Problem List   Diagnosis Date Noted  . Thrombocytosis ([place]) [D47.3] 11/06/2017  . Acute viral conjunctivitis of both eyes [B30.9] 10/12/2016  . Chiari malformation type II ([place]) [Q07.01] 09/27/2016  . Abnormal MRI of head [R93.0] 03/07/2016  . Elevated blood sugar level [R73.9] 02/24/2016  . Visual disturbance [H53.9] 02/24/2016  . Gallstones and inflammation of gallbladder without obstruction [K80.00] 08/28/2015  . Gastroesophageal reflux disease without esophagitis [K21.9] 08/19/2015  . IBS (irritable bowel syndrome) [K58.9] 08/19/2015  . Numbness and tingling of upper and lower extremities of  both sides [R20.0, R20.2] 04/14/2015  . Routine general medical examination at a health care facility [Z00.00] 07/14/2014  . Vitamin B 12 deficiency [E53.8] 12/08/2008  . CARPAL TUNNEL SYNDROME [G56.00] 05/14/2007  . DE QUERVAIN'S TENOSYNOVITIS [M65.4] 05/14/2007  . Allergic rhinitis [J30.9] 01/31/2007    Past Medical History Past Medical History:  Diagnosis Date  . Allergic rhinitis   . Allergy   . B12 deficiency   . Carpal tunnel syndrome   . Dysfunction of eustachian tube     Past Surgical History Past Surgical History:  Procedure Laterality Date  . BUNIONECTOMY     right   . CHOLECYSTECTOMY N/A 09/25/2015   Procedure: LAPAROSCOPIC CHOLECYSTECTOMY WITH INTRAOPERATIVE CHOLANGIOGRAM;  Surgeon: Alphonsa Overall, MD;  Location: [facility];  Service: General;  Laterality: N/A;  . TONSILLECTOMY      Family History Family History  Problem Relation Age of Onset  . Heart disease Father   . Hypertension Father   . Diabetes Father   . Heart disease Maternal Grandmother   . Headache Neg Hx      Social History  reports that she has never smoked. She has never used smokeless tobacco. She reports that she does not drink alcohol or use drugs.  Medications  Current Outpatient Medications:  .  cyanocobalamin 1000 MCG tablet, Take 1,000 mcg by mouth daily., Disp: , Rfl:  .  esomeprazole (NEXIUM) 40 MG capsule, Take 1 capsule (40 mg total) by mouth daily at 12 noon., Disp: 90 capsule, Rfl:  0  Allergies Bactrim ds [sulfamethoxazole-trimethoprim]; Cephalosporins; Oseltamivir phosphate; Amoxicillin; and Doxycycline  Review of Systems Review of Systems - Oncology ROS negative other than indigestion.     Physical Exam  Vitals Wt Readings from Last 3 Encounters:  05/17/18 199 lb (90.3 kg)  02/09/18 193 lb (87.5 kg)  01/18/18 198 lb 1.9 oz (89.9 kg)   Temp Readings from Last 3 Encounters:  05/17/18 98.6 F (37 C) (Oral)  02/09/18 98 F (36.7 C) (Oral)  02/02/18 98.3 F (36.8 C)  (Oral)   BP Readings from Last 3 Encounters:  05/17/18 129/69  02/09/18 120/76  02/02/18 116/72   Pulse Readings from Last 3 Encounters:  05/17/18 82  02/09/18 99  02/02/18 89   Constitutional: Well-developed, well-nourished, and in no distress.   HENT: Head: Normocephalic and atraumatic.  Mouth/Throat: No oropharyngeal exudate. Mucosa moist. Eyes: Pupils are equal, round, and reactive to light. Conjunctivae are normal. No scleral icterus.  Neck: Normal range of motion. Neck supple. No JVD present.  Cardiovascular: Normal rate, regular rhythm and normal heart sounds.  Exam reveals no gallop and no friction rub.   No murmur heard. Pulmonary/Chest: Effort normal and breath sounds normal. No respiratory distress. No wheezes.No rales.  Abdominal: Soft. Bowel sounds are normal. No distension. There is no tenderness. There is no guarding.  Musculoskeletal: No edema or tenderness.  Lymphadenopathy: No cervical, axillary or supraclavicular adenopathy.  Neurological: Alert and oriented to person, place, and time. No cranial nerve deficit.  Skin: Skin is warm and dry. No rash noted. No erythema. No pallor.  Psychiatric: Affect and judgment normal.   Labs Appointment on 05/17/2018  Component Date Value Ref Range Status  . Ferritin 05/17/2018 67  11 - 307 ng/mL Final   Performed at [facility], [place] [address] [place], [place] [address]  . LDH 05/17/2018 137  98 - 192 U/L Final   Performed at [facility], [place] [address], [place], [place] [address]  . Sodium 05/17/2018 140  135 - 145 mmol/L Final  . Potassium 05/17/2018 3.9  3.5 - 5.1 mmol/L Final  . Chloride 05/17/2018 105  98 - 111 mmol/L Final  . CO2 05/17/2018 24  22 - 32 mmol/L Final  . Glucose, Bld 05/17/2018 98  70 - 99 mg/dL Final  . BUN 05/17/2018 9  6 - 20 mg/dL Final  . Creatinine, Ser 05/17/2018 0.90  0.44 - 1.00 mg/dL Final  . Calcium 05/17/2018 8.7* 8.9 - 10.3 mg/dL Final   . Total Protein 05/17/2018 7.3  6.5 - 8.1 g/dL Final  . Albumin 05/17/2018 3.5  3.5 - 5.0 g/dL Final  . AST 05/17/2018 16  15 - 41 U/L Final  . ALT 05/17/2018 13  0 - 44 U/L Final  . Alkaline Phosphatase 05/17/2018 95  38 - 126 U/L Final  . Total Bilirubin 05/17/2018 0.8  0.3 - 1.2 mg/dL Final  . GFR calc non Af Amer 05/17/2018 >60  >60 mL/min Final  . GFR calc Af Amer 05/17/2018 >60  >60 mL/min Final  . Anion gap 05/17/2018 11  5 - 15 Final   Performed at [facility], [place] [address] [place], [place] [address]  . WBC 05/17/2018 6.4  4.0 - 10.5 K/uL Final  . RBC 05/17/2018 4.44  3.87 - 5.11 MIL/uL Final  . Hemoglobin 05/17/2018 13.4  12.0 - 15.0 g/dL Final  . HCT 05/17/2018 39.6  36.0 - 46.0 % Final  . MCV  05/17/2018 89.2  80.0 - 100.0 fL Final  . MCH 05/17/2018 30.2  26.0 - 34.0 pg Final  . MCHC 05/17/2018 33.8  30.0 - 36.0 g/dL Final  . RDW 05/17/2018 13.1  11.5 - 15.5 % Final  . Platelets 05/17/2018 353  150 - 400 K/uL Final  . nRBC 05/17/2018 0.0  0.0 - 0.2 % Final  . Neutrophils Relative % 05/17/2018 64  % Final  . Neutro Abs 05/17/2018 4.1  1.7 - 7.7 K/uL Final  . Lymphocytes Relative 05/17/2018 27  % Final  . Lymphs Abs 05/17/2018 1.8  0.7 - 4.0 K/uL Final  . Monocytes Relative 05/17/2018 7  % Final  . Monocytes Absolute 05/17/2018 0.5  0.1 - 1.0 K/uL Final  . Eosinophils Relative 05/17/2018 1  % Final  . Eosinophils Absolute 05/17/2018 0.1  0.0 - 0.5 K/uL Final  . Basophils Relative 05/17/2018 1  % Final  . Basophils Absolute 05/17/2018 0.0  0.0 - 0.1 K/uL Final  . Immature Granulocytes 05/17/2018 0  % Final  . Abs Immature Granulocytes 05/17/2018 0.01  0.00 - 0.07 K/uL Final   Performed at [facility], [place] [address] [place], [place] [address]     Pathology Orders Placed This Encounter  Procedures  . CBC with Differential ([facility] Only)    Standing Status:   Future    Standing Expiration Date:   05/16/2020   . CMP ([place] only)    Standing Status:   Future    Standing Expiration Date:   05/16/2020  . Lactate dehydrogenase (LDH)    Standing Status:   Future    Standing Expiration Date:   05/16/2020  . Ambulatory referral to Gastroenterology    Referral Priority:   Routine    Referral Type:   Consultation    Referral Reason:   Specialty Services Required    Number of Visits Requested:   1       Zoila Shutter MD

## 2018-05-18 LAB — PROTEIN ELECTROPHORESIS, SERUM
A/G Ratio: 1.1 (ref 0.7–1.7)
Albumin ELP: 3.5 g/dL (ref 2.9–4.4)
Alpha-1-Globulin: 0.3 g/dL (ref 0.0–0.4)
Alpha-2-Globulin: 0.8 g/dL (ref 0.4–1.0)
Beta Globulin: 1.1 g/dL (ref 0.7–1.3)
Gamma Globulin: 1.2 g/dL (ref 0.4–1.8)
Globulin, Total: 3.3 g/dL (ref 2.2–3.9)
Total Protein ELP: 6.8 g/dL (ref 6.0–8.5)

## 2018-05-19 LAB — METHYLMALONIC ACID, SERUM: Methylmalonic Acid, Quantitative: 79 nmol/L (ref 0–378)

## 2018-06-15 ENCOUNTER — Inpatient Hospital Stay: Payer: 59 | Attending: Internal Medicine

## 2018-06-15 ENCOUNTER — Other Ambulatory Visit: Payer: Self-pay

## 2018-06-15 VITALS — BP 121/68 | HR 82 | Temp 98.0°F | Resp 18

## 2018-06-15 DIAGNOSIS — E538 Deficiency of other specified B group vitamins: Secondary | ICD-10-CM | POA: Diagnosis present

## 2018-06-15 MED ORDER — CYANOCOBALAMIN 1000 MCG/ML IJ SOLN
INTRAMUSCULAR | Status: AC
  Filled 2018-06-15: qty 1

## 2018-06-15 MED ORDER — CYANOCOBALAMIN 1000 MCG/ML IJ SOLN
1000.0000 ug | Freq: Once | INTRAMUSCULAR | Status: AC
  Administered 2018-06-15: 1000 ug via INTRAMUSCULAR

## 2018-07-17 ENCOUNTER — Inpatient Hospital Stay: Payer: 59 | Attending: Internal Medicine

## 2018-07-17 ENCOUNTER — Other Ambulatory Visit: Payer: Self-pay

## 2018-07-17 VITALS — BP 122/62 | HR 78 | Temp 98.2°F | Resp 18

## 2018-07-17 DIAGNOSIS — E538 Deficiency of other specified B group vitamins: Secondary | ICD-10-CM

## 2018-07-17 MED ORDER — CYANOCOBALAMIN 1000 MCG/ML IJ SOLN
1000.0000 ug | Freq: Once | INTRAMUSCULAR | Status: AC
  Administered 2018-07-17: 1000 ug via INTRAMUSCULAR

## 2018-08-15 ENCOUNTER — Ambulatory Visit (HOSPITAL_COMMUNITY)
Admission: EM | Admit: 2018-08-15 | Discharge: 2018-08-15 | Disposition: A | Discharge status: Home / Self Care | Payer: 59 | Attending: Urgent Care | Admitting: Urgent Care

## 2018-08-15 ENCOUNTER — Other Ambulatory Visit: Payer: Self-pay

## 2018-08-15 ENCOUNTER — Telehealth: Payer: Self-pay | Admitting: Internal Medicine

## 2018-08-15 ENCOUNTER — Ambulatory Visit: Payer: 59 | Admitting: Internal Medicine

## 2018-08-15 ENCOUNTER — Encounter (HOSPITAL_COMMUNITY): Payer: Self-pay

## 2018-08-15 DIAGNOSIS — H9203 Otalgia, bilateral: Secondary | ICD-10-CM | POA: Diagnosis present

## 2018-08-15 DIAGNOSIS — R0982 Postnasal drip: Secondary | ICD-10-CM | POA: Diagnosis present

## 2018-08-15 DIAGNOSIS — Z20828 Contact with and (suspected) exposure to other viral communicable diseases: Secondary | ICD-10-CM | POA: Insufficient documentation

## 2018-08-15 DIAGNOSIS — H6983 Other specified disorders of Eustachian tube, bilateral: Secondary | ICD-10-CM | POA: Insufficient documentation

## 2018-08-15 DIAGNOSIS — J3089 Other allergic rhinitis: Secondary | ICD-10-CM | POA: Diagnosis present

## 2018-08-15 NOTE — ED Provider Notes (Signed)
MRN: [id number] DOB: 06/23/1970  Subjective:   Rhonda [name] is a 48 y.o. female presenting for 1 day history of bilateral ear pain. Has had 3 day history of bilateral ear popping. Has muffled hearing. Has tried old ear drops from previous infection. Has history of allergies, takes Zyrtec as needed. Denies smoking cigarettes.    No current facility-administered medications for this encounter.   Current Outpatient Medications:  .  cyanocobalamin 1000 MCG tablet, Take 1,000 mcg by mouth daily., Disp: , Rfl:  .  esomeprazole (NEXIUM) 40 MG capsule, Take 1 capsule (40 mg total) by mouth daily at 12 noon., Disp: 90 capsule, Rfl: 0    Allergies  Allergen Reactions  . Bactrim Ds [Sulfamethoxazole-Trimethoprim]     blisters  . Cephalosporins     hives  . Oseltamivir Phosphate     REACTION: swelling  . Amoxicillin Hives and Rash    Has patient had a PCN reaction causing immediate rash, facial/tongue/throat swelling, SOB or lightheadedness with hypotension: Yes Has patient had a PCN reaction causing severe rash involving mucus membranes or skin necrosis: Yes Has patient had a PCN reaction that required hospitalization No Has patient had a PCN reaction occurring within the last 10 years: Yes If all of the above answers are "NO", then may proceed with Cephalosporin use.   Marland Kitchen Doxycycline Rash    Past Medical History:  Diagnosis Date  . Allergic rhinitis   . Allergy   . B12 deficiency   . Carpal tunnel syndrome   . Dysfunction of eustachian tube      Past Surgical History:  Procedure Laterality Date  . BUNIONECTOMY     right   . CHOLECYSTECTOMY N/A 09/25/2015   Procedure: LAPAROSCOPIC CHOLECYSTECTOMY WITH INTRAOPERATIVE CHOLANGIOGRAM;  Surgeon: Alphonsa Overall, MD;  Location: [facility];  Service: General;  Laterality: N/A;  . TONSILLECTOMY      Review of Systems  Constitutional: Positive for fever (subjective). Negative for malaise/fatigue.  HENT: Positive for ear pain and hearing  loss. Negative for congestion, sinus pain and sore throat.   Eyes: Negative for blurred vision, double vision, discharge and redness.  Respiratory: Negative for cough, hemoptysis, shortness of breath and wheezing.   Cardiovascular: Negative for chest pain.  Gastrointestinal: Negative for abdominal pain, diarrhea, nausea and vomiting.  Genitourinary: Negative for dysuria, flank pain and hematuria.  Musculoskeletal: Negative for myalgias.  Skin: Negative for rash.  Neurological: Negative for dizziness, weakness and headaches.  Psychiatric/Behavioral: Negative for depression and substance abuse.    Objective:   Vitals: BP 119/89 (BP Location: Left Arm)   Pulse 95   Temp 99.1 F (37.3 C) (Oral)   Resp 17   SpO2 100%    Physical Exam Constitutional:      General: She is not in acute distress.    Appearance: She is well-developed. She is not ill-appearing.  HENT:     Head: Normocephalic and atraumatic.     Right Ear: Ear canal normal. No drainage, swelling or tenderness. No middle ear effusion. Tympanic membrane is not injected, scarred, perforated or erythematous.     Left Ear: Ear canal normal. No drainage, swelling or tenderness.  No middle ear effusion. Tympanic membrane is not injected, scarred, perforated or erythematous.     Ears:     Comments: TMs mildly opacified bilaterally.    Nose: No congestion or rhinorrhea.     Right Turbinates: Swollen.     Left Turbinates: Swollen.     Right Sinus: No maxillary sinus  tenderness.     Left Sinus: No maxillary sinus tenderness.     Mouth/Throat:     Mouth: Mucous membranes are moist. No oral lesions.     Pharynx: No pharyngeal swelling, oropharyngeal exudate, posterior oropharyngeal erythema or uvula swelling.     Tonsils: No tonsillar exudate or tonsillar abscesses.     Comments: Throat with significant postnasal drainage and cobblestone pattern. Eyes:     Extraocular Movements:     Right eye: Normal extraocular motion.     Left  eye: Normal extraocular motion.     Conjunctiva/sclera: Conjunctivae normal.     Pupils: Pupils are equal, round, and reactive to light.  Neck:     Musculoskeletal: Normal range of motion and neck supple.  Cardiovascular:     Rate and Rhythm: Normal rate.  Pulmonary:     Effort: Pulmonary effort is normal.  Lymphadenopathy:     Cervical: No cervical adenopathy.  Skin:    General: Skin is warm and dry.  Neurological:     General: No focal deficit present.     Mental Status: She is alert and oriented to person, place, and time.  Psychiatric:        Mood and Affect: Mood normal.        Behavior: Behavior normal.     Assessment and Plan :   1. Eustachian tube dysfunction, bilateral   2. Ear pain, bilateral   3. Allergic rhinitis due to other allergic trigger, unspecified seasonality   4. Post-nasal drainage     Suspect patient has eustachian tube dysfunction secondary to uncontrolled allergic rhinitis.  Counseled on the possibility of COVID-19 given her subjective fever and patient was eventually agreeable to COVID-19 testing.  Also discussed possibility of general URI, sinus infection.  Given significant allergies to antibiotics will hold off on covering for this.  Recommended use of her Zyrtec, also start Sudafed at 60 mg twice daily or 3 times daily. Counseled patient on potential for adverse effects with medications prescribed/recommended today, ER and return-to-clinic precautions discussed, patient verbalized understanding.    Jaynee Eagles, [place] 08/15/18 1223

## 2018-08-15 NOTE — Discharge Instructions (Addendum)
Please take Tylenol 500mg  every 6 hours for aches and fever. Hydrate very well with at least 2 liters of water. Eat light meals such as soups to replenish electrolytes and soft fruits, veggies. Start an antihistamine like Zyrtec, Allegra or Claritin for postnasal drainage, sinus congestion and eustachian tube dysfunction.  You can take this together with pseudoephedrine (Sudafed) at a dose of 60 mg 3 times a day or 2 times daily as needed for the same kind of congestion.  Make sure you ask the pharmacist for this specifically as it is not available over the counter. However, do not take Sudafed if you have high blood pressure or are prone to palpitations, have abnormal heart rhythms.

## 2018-08-15 NOTE — ED Triage Notes (Signed)
Patient presents to Urgent Care with complaints of bilateral ear pain. Patient reports no cough, fever, body aches, etc.

## 2018-08-15 NOTE — Telephone Encounter (Signed)
Patient would like a call back from Dr. Marcello Moores or his nurse because she was advised to go to ED for ear pain by him but she felt they did not help her really. She said they tested her for COVID, did not diagnose her with anything, and her ear still hurts. She was not prescribed anything.

## 2018-08-16 ENCOUNTER — Inpatient Hospital Stay: Payer: 59

## 2018-08-16 NOTE — Telephone Encounter (Signed)
Patient set up for virtual visit with Mickel Baas tomorrow.

## 2018-08-16 NOTE — Telephone Encounter (Signed)
Please inform her that it was not my recommendation that she go to the ED.  I was informed by administration that we were not comfortable seeing her here because of concerns about COVID.  If she wants me to check out her ear I will be glad to see her in person today.  TJ

## 2018-08-17 ENCOUNTER — Ambulatory Visit (INDEPENDENT_AMBULATORY_CARE_PROVIDER_SITE_OTHER): Payer: 59 | Admitting: Family

## 2018-08-17 ENCOUNTER — Encounter: Payer: Self-pay | Admitting: Family

## 2018-08-17 DIAGNOSIS — J019 Acute sinusitis, unspecified: Secondary | ICD-10-CM | POA: Diagnosis not present

## 2018-08-17 DIAGNOSIS — R3911 Hesitancy of micturition: Secondary | ICD-10-CM | POA: Diagnosis not present

## 2018-08-17 LAB — NOVEL CORONAVIRUS, NAA (HOSP ORDER, SEND-OUT TO REF LAB; TAT 18-24 HRS): SARS-CoV-2, NAA: NOT DETECTED

## 2018-08-17 MED ORDER — LEVOFLOXACIN 500 MG PO TABS: 500.0000 mg | ORAL_TABLET | Freq: Every day | ORAL | 0 refills | Status: DC

## 2018-08-17 MED ORDER — NEOMYCIN-POLYMYXIN-HC 3.5-10000-1 OT SOLN: 3.0000 [drp] | Freq: Four times a day (QID) | OTIC | 0 refills | Status: DC

## 2018-08-17 NOTE — Progress Notes (Signed)
Rhonda [name] is a 48 y.o. female with the following history as recorded in Epic[employer]:  Patient Active Problem List   Diagnosis Date Noted  . Thrombocytosis ([place]) 11/06/2017  . Acute viral conjunctivitis of both eyes 10/12/2016  . Chiari malformation type II ([place]) 09/27/2016  . Abnormal MRI of head 03/07/2016  . Elevated blood sugar level 02/24/2016  . Visual disturbance 02/24/2016  . Gallstones and inflammation of gallbladder without obstruction 08/28/2015  . Gastroesophageal reflux disease without esophagitis 08/19/2015  . IBS (irritable bowel syndrome) 08/19/2015  . Numbness and tingling of upper and lower extremities of both sides 04/14/2015  . Routine general medical examination at a health care facility 07/14/2014  . Vitamin B 12 deficiency 12/08/2008  . CARPAL TUNNEL SYNDROME 05/14/2007  . DE QUERVAIN'S TENOSYNOVITIS 05/14/2007  . Allergic rhinitis 01/31/2007    Current Outpatient Medications  Medication Sig Dispense Refill  . cyanocobalamin 1000 MCG tablet Take 1,000 mcg by mouth daily.    Marland Kitchen esomeprazole (NEXIUM) 40 MG capsule Take 1 capsule (40 mg total) by mouth daily at 12 noon. 90 capsule 0  . levofloxacin (LEVAQUIN) 500 MG tablet Take 1 tablet (500 mg total) by mouth daily. 10 tablet 0  . neomycin-polymyxin-hydrocortisone (CORTISPORIN) OTIC solution Place 3 drops into both ears 4 (four) times daily. 10 mL 0   No current facility-administered medications for this visit.     Allergies: Bactrim ds [sulfamethoxazole-trimethoprim], Cephalosporins, Oseltamivir phosphate, Amoxicillin, and Doxycycline  Past Medical History:  Diagnosis Date  . Allergic rhinitis   . Allergy   . B12 deficiency   . Carpal tunnel syndrome   . Dysfunction of eustachian tube     Past Surgical History:  Procedure Laterality Date  . BUNIONECTOMY     right   . CHOLECYSTECTOMY N/A 09/25/2015   Procedure: LAPAROSCOPIC CHOLECYSTECTOMY WITH INTRAOPERATIVE CHOLANGIOGRAM;  Surgeon: Alphonsa Overall, MD;   Location: [facility];  Service: General;  Laterality: N/A;  . TONSILLECTOMY      Family History  Problem Relation Age of Onset  . Heart disease Father   . Hypertension Father   . Diabetes Father   . Hyperlipidemia Mother   . Hypertension Mother   . Heart disease Maternal Grandmother   . Headache Neg Hx     Social History   Tobacco Use  . Smoking status: Never Smoker  . Smokeless tobacco: Never Used  Substance Use Topics  . Alcohol use: No    Subjective:    I connected with Rhonda [name] on 08/17/18 at 10:20 AM EDT by a video enabled telemedicine application and verified that I am speaking with the correct person using two identifiers. Patient and I are the only 2 people on the video call.    I discussed the limitations of evaluation and management by telemedicine and the availability of in person appointments. The patient expressed understanding and agreed to proceed.  Complaining of UTI symptoms x 1 months- "feels like bladder is irritated." Was given Rx for Macrobid x 7 days; does have uterine fibroids- last pelvic ultrasound was done last year ( [facility]); complaining of urinary hesitancy;  Also complaining of fever/ ear pain x 4 days; went to [facility] 2 days ago and was told she had potential ear infection but had COVID test and no antibiotic given; is prone to recurrent ear infections/ sinus infections;    Objective:  There were no vitals filed for this visit.  General: Well developed, well nourished, in no acute distress  Skin :  Warm and dry.  Head: Normocephalic and atraumatic  Lungs: Respirations unlabored;  Neurologic: Alert and oriented; speech intact; face symmetrical;   Assessment:  1. Acute sinusitis, recurrence not specified, unspecified location   2. Urinary hesitancy     Plan:  Rx for Levaquin 500 mg qd x 10 days; Rx for Cortisporin Otic suspension; will check on patient on Monday; Low suspicion for UTI but Levaquin should cover for the infection as  well; she will plan to drop off a urine sample for culture once she completes the antibiotics; ? Related to uterine fibroids- needs to see GYN in follow-up and is already scheduled for next month.   No follow-ups on file.  No orders of the defined types were placed in this encounter.   Requested Prescriptions   Signed Prescriptions Disp Refills  . levofloxacin (LEVAQUIN) 500 MG tablet 10 tablet 0    Sig: Take 1 tablet (500 mg total) by mouth daily.  Marland Kitchen neomycin-polymyxin-hydrocortisone (CORTISPORIN) OTIC solution 10 mL 0    Sig: Place 3 drops into both ears 4 (four) times daily.

## 2018-08-20 ENCOUNTER — Other Ambulatory Visit: Payer: Self-pay

## 2018-08-20 ENCOUNTER — Encounter (HOSPITAL_COMMUNITY): Payer: Self-pay | Admitting: Emergency Medicine

## 2018-08-20 ENCOUNTER — Telehealth: Payer: Self-pay | Admitting: Family

## 2018-08-20 ENCOUNTER — Ambulatory Visit: Payer: Self-pay | Admitting: Internal Medicine

## 2018-08-20 ENCOUNTER — Encounter (HOSPITAL_COMMUNITY): Payer: Self-pay

## 2018-08-20 ENCOUNTER — Emergency Department (HOSPITAL_COMMUNITY)
Admission: EM | Admit: 2018-08-20 | Discharge: 2018-08-20 | Disposition: A | Discharge status: Home / Self Care | Payer: 59 | Attending: Emergency Medicine | Admitting: Emergency Medicine

## 2018-08-20 ENCOUNTER — Emergency Department (HOSPITAL_COMMUNITY): Payer: 59

## 2018-08-20 DIAGNOSIS — R509 Fever, unspecified: Secondary | ICD-10-CM | POA: Diagnosis present

## 2018-08-20 DIAGNOSIS — Z20828 Contact with and (suspected) exposure to other viral communicable diseases: Secondary | ICD-10-CM | POA: Insufficient documentation

## 2018-08-20 DIAGNOSIS — Z79899 Other long term (current) drug therapy: Secondary | ICD-10-CM | POA: Insufficient documentation

## 2018-08-20 DIAGNOSIS — B349 Viral infection, unspecified: Secondary | ICD-10-CM | POA: Insufficient documentation

## 2018-08-20 MED ORDER — GUAIFENESIN ER 1200 MG PO TB12: 1.0000 | ORAL_TABLET | Freq: Two times a day (BID) | ORAL | 0 refills | Status: DC

## 2018-08-20 MED ORDER — PROMETHAZINE-DM 6.25-15 MG/5ML PO SYRP: 5.0000 mL | ORAL_SOLUTION | Freq: Four times a day (QID) | ORAL | 0 refills | Status: DC | PRN

## 2018-08-20 NOTE — Discharge Instructions (Addendum)
Return here as needed.  Your chest x-ray did not show any signs of pneumonia at this point.

## 2018-08-20 NOTE — ED Triage Notes (Signed)
Pt.stated, [name] had a ear ache that started on Tuesday with a fever and started on antibiotic on Friday. Im still running a fever. My Dr. Rockey Situ me to come here.

## 2018-08-20 NOTE — ED Triage Notes (Signed)
Ive had tested for COVID and neg.

## 2018-08-20 NOTE — Telephone Encounter (Signed)
Patient has called in stating that she has continued to have a temp of 101 since Tuesday.  She is requesting to be seen in the office.  States she did not feel any better over the weekend.   Please advise.

## 2018-08-20 NOTE — Telephone Encounter (Signed)
Pt called in c/o a fever 101 since Tuesday.   Was dx with a sinus infection and an ear infection when saw Jodi Mourning last week.  No better with antibiotics.  I warm transferred the call into office to [facility] for scheduling.     I sent my notes to the office of Jodi Mourning.    Reason for Disposition . Fever present > 3 days (72 hours)  Answer Assessment - Initial Assessment Questions 1. TEMPERATURE: "What is the most recent temperature?"  "How was it measured?"      Fever 101 for last 5 days.    2. ONSET: "When did the fever start?"      5 days ago.   Started Tuesday.   3. SYMPTOMS: "Do you have any other symptoms besides the fever?"  (e.g., colds, headache, sore throat, earache, cough, rash, diarrhea, vomiting, abdominal pain)     Headaches, one side of nose is stopped up.   COVID-19 was negative.    Have a slight cough it's dry. 4. CAUSE: If there are no symptoms, ask: "What do you think is causing the fever?"      I had an ear infection and a sinus infection when I saw Jodi Mourning last week. 5. CONTACTS: "Does anyone else in the family have an infection?"     No 6. TREATMENT: "What have you done so far to treat this fever?" (e.g., medications)     Antibiotics.   I started them Friday.  7. IMMUNOCOMPROMISE: "Do you have of the following: diabetes, HIV positive, splenectomy, cancer chemotherapy, chronic steroid treatment, transplant patient, etc."     None 8. PREGNANCY: "Is there any chance you are pregnant?" "When was your last menstrual period?"     *No Answer*Not asked 9. TRAVEL: "Have you traveled out of the country in the last month?" (e.g., travel history, exposures)     No  Protocols used: FEVER-A-AH

## 2018-08-20 NOTE — Telephone Encounter (Signed)
Spoke with patient- recommended ER evaluation and she agreed;

## 2018-08-21 ENCOUNTER — Telehealth: Payer: Self-pay | Admitting: Family

## 2018-08-21 ENCOUNTER — Ambulatory Visit: Payer: Self-pay

## 2018-08-21 LAB — NOVEL CORONAVIRUS, NAA (HOSP ORDER, SEND-OUT TO REF LAB; TAT 18-24 HRS): SARS-CoV-2, NAA: NOT DETECTED

## 2018-08-21 NOTE — ED Provider Notes (Signed)
[facility] EMERGENCY DEPARTMENT Provider Note   CSN: [id number] Arrival date & time: 08/20/18  1151     History   Chief Complaint Chief Complaint  Patient presents with  . Fever  . Otalgia    HPI Rhonda [name] is a 48 y.o. female.     HPI Patient presents to the emergency department with fever and earaches that started on Tuesday of last week.  Patient was started on antibiotic on Friday.  The patient states that she was told by her doctor to come in for further evaluation.  Patient states that she has had some slight cough but no other symptoms.  Patient states that she did not take any other medications prior to arrival for her symptoms.  The patient denies chest pain, shortness of breath, headache,blurred vision, neck pain, weakness, numbness, dizziness, anorexia, edema, abdominal pain, nausea, vomiting, diarrhea, rash, back pain, dysuria, hematemesis, bloody stool, near syncope, or syncope. Past Medical History:  Diagnosis Date  . Allergic rhinitis   . Allergy   . B12 deficiency   . Carpal tunnel syndrome   . Dysfunction of eustachian tube     Patient Active Problem List   Diagnosis Date Noted  . Thrombocytosis ([place]) 11/06/2017  . Acute viral conjunctivitis of both eyes 10/12/2016  . Chiari malformation type II ([place]) 09/27/2016  . Abnormal MRI of head 03/07/2016  . Elevated blood sugar level 02/24/2016  . Visual disturbance 02/24/2016  . Gallstones and inflammation of gallbladder without obstruction 08/28/2015  . Gastroesophageal reflux disease without esophagitis 08/19/2015  . IBS (irritable bowel syndrome) 08/19/2015  . Numbness and tingling of upper and lower extremities of both sides 04/14/2015  . Routine general medical examination at a health care facility 07/14/2014  . Vitamin B 12 deficiency 12/08/2008  . CARPAL TUNNEL SYNDROME 05/14/2007  . DE QUERVAIN'S TENOSYNOVITIS 05/14/2007  . Allergic rhinitis 01/31/2007    Past Surgical  History:  Procedure Laterality Date  . BUNIONECTOMY     right   . CHOLECYSTECTOMY N/A 09/25/2015   Procedure: LAPAROSCOPIC CHOLECYSTECTOMY WITH INTRAOPERATIVE CHOLANGIOGRAM;  Surgeon: Alphonsa Overall, MD;  Location: [facility];  Service: General;  Laterality: N/A;  . TONSILLECTOMY       OB History   No obstetric history on file.      Home Medications    Prior to Admission medications   Medication Sig Start Date End Date Taking? Authorizing Provider  cyanocobalamin 1000 MCG tablet Take 1,000 mcg by mouth daily.    [provider]  esomeprazole (NEXIUM) 40 MG capsule Take 1 capsule (40 mg total) by mouth daily at 12 noon. 04/27/18 07/26/18  Marrian Salvage, FNP  Guaifenesin 1200 MG TB12 Take 1 tablet (1,200 mg total) by mouth 2 (two) times daily. 08/20/18   Kolbee Stallman, Harrell Gave, PA-C  levofloxacin (LEVAQUIN) 500 MG tablet Take 1 tablet (500 mg total) by mouth daily. 08/17/18   Marrian Salvage, FNP  neomycin-polymyxin-hydrocortisone (CORTISPORIN) OTIC solution Place 3 drops into both ears 4 (four) times daily. 08/17/18   Marrian Salvage, FNP  promethazine-dextromethorphan (PROMETHAZINE-DM) 6.25-15 MG/5ML syrup Take 5 mLs by mouth 4 (four) times daily as needed for cough. 08/20/18   Dalia Heading, PA-C    Family History Family History  Problem Relation Age of Onset  . Heart disease Father   . Hypertension Father   . Diabetes Father   . Hyperlipidemia Mother   . Hypertension Mother   . Heart disease Maternal Grandmother   . Headache Neg  Hx     Social History Social History   Tobacco Use  . Smoking status: Never Smoker  . Smokeless tobacco: Never Used  Substance Use Topics  . Alcohol use: No  . Drug use: No     Allergies   Bactrim ds [sulfamethoxazole-trimethoprim], Cephalosporins, Oseltamivir phosphate, Amoxicillin, and Doxycycline   Review of Systems Review of Systems All other systems negative except as documented in the HPI. All pertinent  positives and negatives as reviewed in the HPI.  Physical Exam Updated Vital Signs BP (!) 122/100 (BP Location: Right Arm)   Pulse 98   Temp 100 F (37.8 C) (Oral)   Resp 16   Ht 5\' 5"  (1.651 m)   Wt 88.9 kg   LMP 08/13/2018   SpO2 99%   BMI 32.62 kg/m   Physical Exam Vitals signs and nursing note reviewed.  Constitutional:      General: She is not in acute distress.    Appearance: She is well-developed.  HENT:     Head: Normocephalic and atraumatic.  Eyes:     Pupils: Pupils are equal, round, and reactive to light.  Neck:     Musculoskeletal: Normal range of motion and neck supple.  Cardiovascular:     Rate and Rhythm: Normal rate and regular rhythm.     Heart sounds: Normal heart sounds. No murmur. No friction rub. No gallop.   Pulmonary:     Effort: Pulmonary effort is normal. No respiratory distress.     Breath sounds: Normal breath sounds. No wheezing.  Abdominal:     General: Bowel sounds are normal. There is no distension.     Palpations: Abdomen is soft.     Tenderness: There is no abdominal tenderness.  Skin:    General: Skin is warm and dry.     Capillary Refill: Capillary refill takes less than 2 seconds.     Findings: No erythema or rash.  Neurological:     Mental Status: She is alert and oriented to person, place, and time.     Motor: No abnormal muscle tone.     Coordination: Coordination normal.  Psychiatric:        Behavior: Behavior normal.      ED Treatments / Results  Labs (all labs ordered are listed, but only abnormal results are displayed) Labs Reviewed  NOVEL CORONAVIRUS, NAA (HOSPITAL ORDER, SEND-OUT TO REF LAB)    EKG None  Radiology Dg Chest 2 View  Result Date: 08/20/2018 CLINICAL DATA:  Ear ache. EXAM: CHEST - 2 VIEW COMPARISON:  No recent prior. FINDINGS: Mediastinum hilar structures normal. Very mild bilateral interstitial prominence noted. An active process such as mild pneumonitis cannot be excluded. Mild bibasilar  subsegmental atelectasis. No pleural effusion or pneumothorax. No acute bony abnormality. Surgical clips upper abdomen. IMPRESSION: Mild bilateral pulmonary interstitial prominence noted. An active process such as pneumonitis cannot be excluded. Mild bibasilar subsegmental atelectasis. Electronically Signed   By: Marcello Moores  Register   On: 08/20/2018 15:33    Procedures Procedures (including critical care time)  Medications Ordered in ED Medications - No data to display   Initial Impression / Assessment and Plan / ED Course  I have reviewed the triage vital signs and the nursing notes.  Pertinent labs & imaging results that were available during my care of the patient were reviewed by me and considered in my medical decision making (see chart for details).        Patient be sent home and advised to quarantine  until she gets results back.  Have advised the patient to return here as needed.  Patient agrees this plan and all questions were answered.  Final Clinical Impressions(s) / ED Diagnoses   Final diagnoses:  Viral syndrome    ED Discharge Orders         Ordered    Guaifenesin 1200 MG TB12  2 times daily     08/20/18 1550    promethazine-dextromethorphan (PROMETHAZINE-DM) 6.25-15 MG/5ML syrup  4 times daily PRN     08/20/18 1550           Dalia Heading, PA-C 08/21/18 1535    Veryl Speak, MD 08/22/18 [id number]

## 2018-08-21 NOTE — Telephone Encounter (Signed)
  Reason for Disposition . Severe difficulty breathing (e.g., struggling for each breath, speaks in single words)  Answer Assessment - Initial Assessment Questions 1. RESPIRATORY STATUS: "Describe your breathing?" (e.g., wheezing, shortness of breath, unable to speak, severe coughing)    SOB 2. ONSET: "When did this breathing problem begin?"      This am3. PATTERN "Does the difficult breathing come and go, or has it been constant since it started?"      constantERITY: "How bad is your breathing?" (e.g., mild, moderate, severe)    - MILD: No SOB at rest, mild SOB with walking, speaks normally in sentences, can lay down, no retractions, pulse < 100.    - MODERATE: SOB at rest, SOB with minimal exertion and prefers to sit, cannot lie down flat, speaks in phrases, mild retractions, audible wheezing, pulse 100-120.    - SEVERE: Very SOB at rest, speaks in single words, struggling to breathe, sitting hunched forward, retractions, pulse > 120      Severe 5. RECURRENT SYMPTOM: "Have you had difficulty breathing before?" If so, ask: "When was the last time?" and "What happened that time?"      *No Answer* 6. CARDIAC HISTORY: "Do you have any history of heart disease?" (e.g., heart attack, angina, bypass surgery, angioplasty)      *No Answer* 7. LUNG HISTORY: "Do you have any history of lung disease?"  (e.g., pulmonary embolus, asthma, emphysema)     *No Answer* 8. CAUSE: "What do you think is causing the breathing problem?"      *No Answer* 9. OTHER SYMPTOMS: "Do you have any other symptoms? (e.g., dizziness, runny nose, cough, chest pain, fever)      10. PREGNANCY: "Is there any chance you are pregnant?" "When was your last menstrual period?"       *No Answer* 11. TRAVEL: "Have you traveled out of the country in the last month?" (e.g., travel history, exposures)       *No Answer*  Protocols used: BREATHING DIFFICULTY-A-AH

## 2018-08-21 NOTE — Telephone Encounter (Signed)
Spoke with patient after ER visit yesterday; notes that she feels slightly better today; did run a fever again of 101 last night but was fever free at time of phone call; COVID testing re-done at ER and is pending; appreciated phone call and will call back if she needs anything else from [place].

## 2018-08-21 NOTE — Telephone Encounter (Signed)
Incoming call from Patient with complaint of SOB.  Patient states that she is not trying to use to much air to  Talk . Asdked was her breathing that bad she states yes.  Reccomened that she go ED, for further assistance.Patient voiced understanding.

## 2018-08-22 NOTE — Telephone Encounter (Signed)
Spoke with patient today. She is feeling a little better. Still having fever, and feeling dizzy. Her SOB is a little better but still coughing. Said she has also still been coughing also but sitting up more.

## 2018-08-22 NOTE — Telephone Encounter (Signed)
Please call her today and check on her today.

## 2018-08-23 ENCOUNTER — Inpatient Hospital Stay: Payer: 59 | Attending: Internal Medicine

## 2018-08-23 ENCOUNTER — Other Ambulatory Visit: Payer: Self-pay

## 2018-08-23 VITALS — BP 121/72 | HR 98 | Temp 97.8°F | Resp 18

## 2018-08-23 DIAGNOSIS — E538 Deficiency of other specified B group vitamins: Secondary | ICD-10-CM | POA: Diagnosis present

## 2018-08-23 MED ORDER — CYANOCOBALAMIN 1000 MCG/ML IJ SOLN
INTRAMUSCULAR | Status: AC
  Filled 2018-08-23: qty 1

## 2018-08-23 MED ORDER — CYANOCOBALAMIN 1000 MCG/ML IJ SOLN
1000.0000 ug | Freq: Once | INTRAMUSCULAR | Status: AC
  Administered 2018-08-23: 09:00:00 1000 ug via INTRAMUSCULAR

## 2018-08-23 NOTE — Patient Instructions (Signed)

## 2018-08-24 ENCOUNTER — Encounter: Payer: Self-pay | Admitting: Family

## 2018-08-29 ENCOUNTER — Other Ambulatory Visit (INDEPENDENT_AMBULATORY_CARE_PROVIDER_SITE_OTHER): Payer: 59

## 2018-08-29 ENCOUNTER — Telehealth: Payer: Self-pay | Admitting: Internal Medicine

## 2018-08-29 ENCOUNTER — Ambulatory Visit (INDEPENDENT_AMBULATORY_CARE_PROVIDER_SITE_OTHER): Payer: 59 | Admitting: Internal Medicine

## 2018-08-29 ENCOUNTER — Ambulatory Visit (INDEPENDENT_AMBULATORY_CARE_PROVIDER_SITE_OTHER)
Admission: RE | Admit: 2018-08-29 | Discharge: 2018-08-29 | Disposition: A | Discharge status: Home / Self Care | Payer: 59 | Source: Ambulatory Visit | Attending: Internal Medicine | Admitting: Internal Medicine

## 2018-08-29 ENCOUNTER — Encounter: Payer: Self-pay | Admitting: Internal Medicine

## 2018-08-29 ENCOUNTER — Other Ambulatory Visit: Payer: Self-pay

## 2018-08-29 ENCOUNTER — Telehealth: Payer: Self-pay | Admitting: Hematology

## 2018-08-29 VITALS — BP 110/78 | HR 110 | Temp 98.9°F | Resp 16 | Ht 65.0 in | Wt 198.5 lb

## 2018-08-29 DIAGNOSIS — R059 Cough, unspecified: Secondary | ICD-10-CM | POA: Insufficient documentation

## 2018-08-29 DIAGNOSIS — U071 COVID-19: Secondary | ICD-10-CM

## 2018-08-29 DIAGNOSIS — R05 Cough: Secondary | ICD-10-CM

## 2018-08-29 DIAGNOSIS — J189 Pneumonia, unspecified organism: Secondary | ICD-10-CM | POA: Diagnosis not present

## 2018-08-29 DIAGNOSIS — J1289 Other viral pneumonia: Secondary | ICD-10-CM | POA: Diagnosis not present

## 2018-08-29 DIAGNOSIS — J1282 Pneumonia due to coronavirus disease 2019: Secondary | ICD-10-CM

## 2018-08-29 LAB — CBC WITH DIFFERENTIAL/PLATELET
Basophils Absolute: 0.1 10*3/uL (ref 0.0–0.1)
Basophils Relative: 0.7 % (ref 0.0–3.0)
Eosinophils Absolute: 0.1 10*3/uL (ref 0.0–0.7)
Eosinophils Relative: 1.4 % (ref 0.0–5.0)
HCT: 38.8 % (ref 36.0–46.0)
Hemoglobin: 13.1 g/dL (ref 12.0–15.0)
Lymphocytes Relative: 21.9 % (ref 12.0–46.0)
Lymphs Abs: 2.1 10*3/uL (ref 0.7–4.0)
MCHC: 33.8 g/dL (ref 30.0–36.0)
MCV: 88.9 fl (ref 78.0–100.0)
Monocytes Absolute: 1 10*3/uL (ref 0.1–1.0)
Monocytes Relative: 10.3 % (ref 3.0–12.0)
Neutro Abs: 6.3 10*3/uL (ref 1.4–7.7)
Neutrophils Relative %: 65.7 % (ref 43.0–77.0)
Platelets: 675 10*3/uL — ABNORMAL HIGH (ref 150.0–400.0)
RBC: 4.36 Mil/uL (ref 3.87–5.11)
RDW: 13.5 % (ref 11.5–15.5)
WBC: 9.5 10*3/uL (ref 4.0–10.5)

## 2018-08-29 LAB — SEDIMENTATION RATE: Sed Rate: 59 mm/hr — ABNORMAL HIGH (ref 0–20)

## 2018-08-29 LAB — POCT EXHALED NITRIC OXIDE: FeNO level (ppb): 7

## 2018-08-29 MED ORDER — AZITHROMYCIN 500 MG PO TABS: 500.0000 mg | ORAL_TABLET | Freq: Every day | ORAL | 0 refills | Status: DC

## 2018-08-29 MED ORDER — METHYLPREDNISOLONE 4 MG PO TBPK: ORAL_TABLET | ORAL | 0 refills | Status: AC

## 2018-08-29 NOTE — Patient Instructions (Signed)

## 2018-08-29 NOTE — Telephone Encounter (Signed)
[facility] transfer to [place]. F/u next available per [facility]. Spoke with patient and due to pneumonia she is not able to come prior to the 8/24 scheduled injection. F/u schedule 8/24 same day as injection. Confirmed with patient.

## 2018-08-29 NOTE — Progress Notes (Signed)
Subjective:  Patient ID: Rhonda [name], female    DOB: 1970-10-15  Age: 48 y.o. MRN: [id number]  CC: Cough   HPI Rhonda [name] presents for f/up - She has been seen several times in the last 2 weeks with a respiratory illness.  She continues to complain of nonproductive cough, low-grade fever, shortness of breath, fatigue, and weakness.  She denies hemoptysis, chest pain, or night sweats.  Her chest x-ray was abnormal a week ago consistent with pneumonitis.  She has completed a course of levofloxacin without much improvement.  Outpatient Medications Prior to Visit  Medication Sig Dispense Refill   cetirizine (ZYRTEC) 10 MG tablet Take 10 mg by mouth daily.     cyanocobalamin 1000 MCG tablet Take 1,000 mcg by mouth daily.     neomycin-polymyxin-hydrocortisone (CORTISPORIN) OTIC solution Place 3 drops into both ears 4 (four) times daily. 10 mL 0   promethazine-dextromethorphan (PROMETHAZINE-DM) 6.25-15 MG/5ML syrup Take 5 mLs by mouth 4 (four) times daily as needed for cough. 120 mL 0   esomeprazole (NEXIUM) 40 MG capsule Take 1 capsule (40 mg total) by mouth daily at 12 noon. 90 capsule 0   Guaifenesin 1200 MG TB12 Take 1 tablet (1,200 mg total) by mouth 2 (two) times daily. 20 tablet 0   levofloxacin (LEVAQUIN) 500 MG tablet Take 1 tablet (500 mg total) by mouth daily. 10 tablet 0   No facility-administered medications prior to visit.     ROS Review of Systems  Constitutional: Positive for fatigue and fever. Negative for diaphoresis.  HENT: Negative.   Eyes: Negative.   Respiratory: Positive for cough and shortness of breath. Negative for chest tightness and wheezing.   Cardiovascular: Negative for chest pain, palpitations and leg swelling.  Gastrointestinal: Negative for abdominal pain, constipation, diarrhea, nausea and vomiting.  Endocrine: Negative.   Genitourinary: Negative.  Negative for difficulty urinating.  Musculoskeletal: Negative.  Negative for arthralgias and  myalgias.  Skin: Negative.  Negative for color change and pallor.  Neurological: Negative.  Negative for dizziness and weakness.  Hematological: Negative for adenopathy. Does not bruise/bleed easily.  Psychiatric/Behavioral: Negative.     Objective:  BP 110/78 (BP Location: Left Arm, Patient Position: Sitting, Cuff Size: Large)    Pulse (!) 110    Temp 98.9 F (37.2 C) (Oral)    Resp 16    Ht 5\' 5"  (1.651 m)    Wt 198 lb 8 oz (90 kg)    LMP 08/13/2018    SpO2 97%    BMI 33.03 kg/m   BP Readings from Last 3 Encounters:  08/29/18 110/78  08/23/18 121/72  08/20/18 (!) 122/100    Wt Readings from Last 3 Encounters:  08/29/18 198 lb 8 oz (90 kg)  08/20/18 196 lb (88.9 kg)  05/17/18 199 lb (90.3 kg)    Physical Exam Vitals signs reviewed.  Constitutional:      General: She is not in acute distress.    Appearance: She is not ill-appearing, toxic-appearing or diaphoretic.  HENT:     Nose: Nose normal.     Mouth/Throat:     Mouth: Mucous membranes are moist.  Eyes:     Conjunctiva/sclera: Conjunctivae normal.  Neck:     Musculoskeletal: Normal range of motion. No neck rigidity.  Cardiovascular:     Rate and Rhythm: Regular rhythm. Tachycardia present.     Heart sounds: Normal heart sounds.  Pulmonary:     Breath sounds: Examination of the right-upper field reveals decreased breath  sounds. Examination of the left-upper field reveals decreased breath sounds. Examination of the right-middle field reveals decreased breath sounds. Examination of the left-middle field reveals decreased breath sounds. Examination of the right-lower field reveals decreased breath sounds. Examination of the left-lower field reveals decreased breath sounds and rales. Decreased breath sounds and rales present. No wheezing or rhonchi.  Abdominal:     General: Abdomen is flat. Bowel sounds are normal. There is no distension.     Palpations: There is no hepatomegaly or splenomegaly.     Tenderness: There is no  abdominal tenderness.  Musculoskeletal: Normal range of motion.     Right lower leg: No edema.     Left lower leg: No edema.  Lymphadenopathy:     Cervical: No cervical adenopathy.  Skin:    General: Skin is warm and dry.  Neurological:     General: No focal deficit present.  Psychiatric:        Mood and Affect: Mood normal.        Behavior: Behavior normal.     Lab Results  Component Value Date   WBC 6.4 05/17/2018   HGB 13.4 05/17/2018   HCT 39.6 05/17/2018   PLT 353 05/17/2018   GLUCOSE 98 05/17/2018   CHOL 170 06/11/2015   TRIG 39.0 06/11/2015   HDL 55.90 06/11/2015   LDLCALC 107 (H) 06/11/2015   ALT 13 05/17/2018   AST 16 05/17/2018   NA 140 05/17/2018   K 3.9 05/17/2018   CL 105 05/17/2018   CREATININE 0.90 05/17/2018   BUN 9 05/17/2018   CO2 24 05/17/2018   TSH 0.49 01/18/2018   HGBA1C 5.1 09/27/2016    Dg Chest 2 View  Result Date: 08/20/2018 CLINICAL DATA:  Ear ache. EXAM: CHEST - 2 VIEW COMPARISON:  No recent prior. FINDINGS: Mediastinum hilar structures normal. Very mild bilateral interstitial prominence noted. An active process such as mild pneumonitis cannot be excluded. Mild bibasilar subsegmental atelectasis. No pleural effusion or pneumothorax. No acute bony abnormality. Surgical clips upper abdomen. IMPRESSION: Mild bilateral pulmonary interstitial prominence noted. An active process such as pneumonitis cannot be excluded. Mild bibasilar subsegmental atelectasis. Electronically Signed   By: Marcello Moores  Register   On: 08/20/2018 15:33   Dg Chest 2 View  Result Date: 08/29/2018 CLINICAL DATA:  Cough, recent pneumonia EXAM: CHEST - 2 VIEW COMPARISON:  Radiograph 08/20/2018 FINDINGS: Patchy airspace opacities are seen in a basilar and peripheral predominance. Vascularity is normal. Cardiomediastinal contours are unremarkable. No pneumothorax or effusion. No acute osseous or soft tissue abnormality. Cholecystectomy clips in the right upper quadrant. IMPRESSION:  Increasing patchy airspace opacities are compatible with multifocal pneumonia including possible viral etiology. Electronically Signed   By: Lovena Le M.D.   On: 08/29/2018 16:01     Assessment & Plan:   Rhonda [name] was seen today for cough.  Diagnoses and all orders for this visit:  Cough -     Cancel: DG Chest 2 View; Future -     DG Chest 2 View; Future -     Respiratory virus panel; Future -     POCT EXHALED NITRIC OXIDE -     SAR CoV2 Serology (COVID 19)AB(IGG)IA; Future  Pneumonitis- Her chest x-ray is worsening with an increasing patchy airspace opacities consistent with multifocal pneumonia.  I recommended a course of steroids and Zithromax to cover atypical organisms. -     CT Chest High Resolution; Future -     Respiratory virus panel; Future -  CBC with Differential/Platelet; Future -     Basic metabolic panel; Future -     Sedimentation rate; Future -     C-reactive protein; Future -     Hepatic function panel; Future -     SAR CoV2 Serology (COVID 19)AB(IGG)IA; Future -     azithromycin (ZITHROMAX) 500 MG tablet; Take 1 tablet (500 mg total) by mouth daily for 3 days. -     methylPREDNISolone (MEDROL DOSEPAK) 4 MG TBPK tablet; TAKE AS DIRECTED   I have discontinued Samuel Germany. Pankonin's levofloxacin and Guaifenesin. I am also having her start on azithromycin and methylPREDNISolone. Additionally, I am having her maintain her cyanocobalamin, esomeprazole, neomycin-polymyxin-hydrocortisone, promethazine-dextromethorphan, and cetirizine.  Meds ordered this encounter  Medications   azithromycin (ZITHROMAX) 500 MG tablet    Sig: Take 1 tablet (500 mg total) by mouth daily for 3 days.    Dispense:  3 tablet    Refill:  0   methylPREDNISolone (MEDROL DOSEPAK) 4 MG TBPK tablet    Sig: TAKE AS DIRECTED    Dispense:  21 tablet    Refill:  0     Follow-up: Return in about 3 weeks (around 09/19/2018).  Scarlette Calico, MD

## 2018-08-29 NOTE — Telephone Encounter (Signed)
Patient has dropped off FMLA forms to be completed.  I have placed in Britts box for tracking.

## 2018-08-30 ENCOUNTER — Encounter (HOSPITAL_COMMUNITY): Payer: Self-pay

## 2018-08-30 ENCOUNTER — Emergency Department (HOSPITAL_COMMUNITY)
Admission: EM | Admit: 2018-08-30 | Discharge: 2018-08-30 | Disposition: A | Discharge status: Home / Self Care | Payer: 59 | Attending: Emergency Medicine | Admitting: Emergency Medicine

## 2018-08-30 ENCOUNTER — Emergency Department (HOSPITAL_COMMUNITY): Payer: 59

## 2018-08-30 ENCOUNTER — Other Ambulatory Visit: Payer: Self-pay

## 2018-08-30 ENCOUNTER — Encounter: Payer: Self-pay | Admitting: Internal Medicine

## 2018-08-30 DIAGNOSIS — J069 Acute upper respiratory infection, unspecified: Secondary | ICD-10-CM | POA: Insufficient documentation

## 2018-08-30 DIAGNOSIS — R0602 Shortness of breath: Secondary | ICD-10-CM | POA: Diagnosis present

## 2018-08-30 DIAGNOSIS — Z79899 Other long term (current) drug therapy: Secondary | ICD-10-CM | POA: Diagnosis not present

## 2018-08-30 DIAGNOSIS — Z880 Allergy status to penicillin: Secondary | ICD-10-CM | POA: Insufficient documentation

## 2018-08-30 DIAGNOSIS — Z882 Allergy status to sulfonamides status: Secondary | ICD-10-CM | POA: Diagnosis not present

## 2018-08-30 DIAGNOSIS — Z20828 Contact with and (suspected) exposure to other viral communicable diseases: Secondary | ICD-10-CM | POA: Insufficient documentation

## 2018-08-30 DIAGNOSIS — Z888 Allergy status to other drugs, medicaments and biological substances status: Secondary | ICD-10-CM | POA: Diagnosis not present

## 2018-08-30 DIAGNOSIS — U071 COVID-19: Secondary | ICD-10-CM | POA: Insufficient documentation

## 2018-08-30 LAB — BASIC METABOLIC PANEL
Anion gap: 12 (ref 5–15)
BUN: 5 mg/dL — ABNORMAL LOW (ref 6–23)
BUN: 5 mg/dL — ABNORMAL LOW (ref 6–20)
CO2: 29 mEq/L (ref 19–32)
CO2: 27 mmol/L (ref 22–32)
Calcium: 9.6 mg/dL (ref 8.4–10.5)
Calcium: 9.3 mg/dL (ref 8.9–10.3)
Chloride: 99 mEq/L (ref 96–112)
Chloride: 100 mmol/L (ref 98–111)
Creatinine, Ser: 0.79 mg/dL (ref 0.40–1.20)
Creatinine, Ser: 0.77 mg/dL (ref 0.44–1.00)
GFR calc Af Amer: >60 mL/min (ref >60)
GFR calc non Af Amer: >60 mL/min (ref >60)
GFR: 93.61 mL/min (ref >60.00)
Glucose, Bld: 97 mg/dL (ref 70–99)
Glucose, Bld: 119 mg/dL — ABNORMAL HIGH (ref 70–99)
Potassium: 3.6 mEq/L (ref 3.5–5.1)
Potassium: 3.2 mmol/L — ABNORMAL LOW (ref 3.5–5.1)
Sodium: 137 mEq/L (ref 135–145)
Sodium: 139 mmol/L (ref 135–145)

## 2018-08-30 LAB — LACTIC ACID, PLASMA
Lactic Acid, Venous: 1.1 mmol/L (ref 0.5–1.9)
Lactic Acid, Venous: 1.9 mmol/L (ref 0.5–1.9)

## 2018-08-30 LAB — RESPIRATORY VIRUS PANEL
Influenza A RNA: NOT DETECTED
Influenza B RNA: NOT DETECTED
RSV RNA: NOT DETECTED
hMPV: NOT DETECTED

## 2018-08-30 LAB — HEPATIC FUNCTION PANEL
ALT: 34 U/L (ref 0–35)
ALT: 36 U/L (ref 0–44)
AST: 42 U/L — ABNORMAL HIGH (ref 0–37)
AST: 33 U/L (ref 15–41)
Albumin: 3.8 g/dL (ref 3.5–5.2)
Albumin: 3.4 g/dL — ABNORMAL LOW (ref 3.5–5.0)
Alkaline Phosphatase: 82 U/L (ref 39–117)
Alkaline Phosphatase: 82 U/L (ref 38–126)
Bilirubin, Direct: 0.2 mg/dL (ref 0.0–0.3)
Bilirubin, Direct: 0.2 mg/dL (ref 0.0–0.2)
Indirect Bilirubin: 1 mg/dL — ABNORMAL HIGH (ref 0.3–0.9)
Total Bilirubin: 0.9 mg/dL (ref 0.2–1.2)
Total Bilirubin: 1.2 mg/dL (ref 0.3–1.2)
Total Protein: 7.9 g/dL (ref 6.0–8.3)
Total Protein: 8 g/dL (ref 6.5–8.1)

## 2018-08-30 LAB — CBC WITH DIFFERENTIAL/PLATELET
Abs Immature Granulocytes: 0.02 10*3/uL (ref 0.00–0.07)
Basophils Absolute: 0 10*3/uL (ref 0.0–0.1)
Basophils Relative: 0 %
Eosinophils Absolute: 0 10*3/uL (ref 0.0–0.5)
Eosinophils Relative: 0 %
HCT: 40.2 % (ref 36.0–46.0)
Hemoglobin: 13.1 g/dL (ref 12.0–15.0)
Immature Granulocytes: 0 %
Lymphocytes Relative: 14 %
Lymphs Abs: 0.9 10*3/uL (ref 0.7–4.0)
MCH: 29.4 pg (ref 26.0–34.0)
MCHC: 32.6 g/dL (ref 30.0–36.0)
MCV: 90.3 fL (ref 80.0–100.0)
Monocytes Absolute: 0.1 10*3/uL (ref 0.1–1.0)
Monocytes Relative: 2 %
Neutro Abs: 5.3 10*3/uL (ref 1.7–7.7)
Neutrophils Relative %: 84 %
Platelets: 665 10*3/uL — ABNORMAL HIGH (ref 150–400)
RBC: 4.45 MIL/uL (ref 3.87–5.11)
RDW: 13.2 % (ref 11.5–15.5)
WBC: 6.3 10*3/uL (ref 4.0–10.5)
nRBC: 0 % (ref 0.0–0.2)

## 2018-08-30 LAB — C-REACTIVE PROTEIN
CRP: 8.5 mg/dL (ref 0.5–20.0)
CRP: 6.3 mg/dL — ABNORMAL HIGH (ref <1.0)

## 2018-08-30 LAB — SEDIMENTATION RATE: Sed Rate: 83 mm/hr — ABNORMAL HIGH (ref 0–22)

## 2018-08-30 LAB — SARS CORONAVIRUS 2 BY RT PCR (HOSPITAL ORDER, PERFORMED IN ~~LOC~~ HOSPITAL LAB): SARS Coronavirus 2: NEGATIVE

## 2018-08-30 LAB — SAR COV2 SEROLOGY (COVID19)AB(IGG),IA: SARS CoV2 AB IGG: POSITIVE — AB

## 2018-08-30 LAB — LACTATE DEHYDROGENASE: LDH: 184 U/L (ref 98–192)

## 2018-08-30 LAB — HCG, QUANTITATIVE, PREGNANCY: hCG, Beta Chain, Quant, S: 2 m[IU]/mL (ref <5)

## 2018-08-30 LAB — D-DIMER, QUANTITATIVE: D-Dimer, Quant: 1.17 ug/mL-FEU — ABNORMAL HIGH (ref 0.00–0.50)

## 2018-08-30 LAB — FIBRINOGEN: Fibrinogen: 713 mg/dL — ABNORMAL HIGH (ref 210–475)

## 2018-08-30 LAB — FERRITIN: Ferritin: 147 ng/mL (ref 11–307)

## 2018-08-30 LAB — TRIGLYCERIDES: Triglycerides: 56 mg/dL (ref <150)

## 2018-08-30 LAB — PROCALCITONIN: Procalcitonin: 0.12 ng/mL

## 2018-08-30 MED ORDER — IOHEXOL 350 MG/ML SOLN
100.0000 mL | Freq: Once | INTRAVENOUS | Status: AC | PRN
  Administered 2018-08-30: 100 mL via INTRAVENOUS

## 2018-08-30 NOTE — Telephone Encounter (Signed)
Pt called back and went over result with pt and she agreed to go to ED. Pt requested that we call the ED. ED has been contacted and charge nurse has been informed of same.

## 2018-08-30 NOTE — ED Notes (Signed)
Pt sts she had her blood work drawn at Dr. Oletta Cohn and pt tested positive for the Covid antibodies but no positive Covid test.

## 2018-08-30 NOTE — Addendum Note (Signed)
Addended by: Janith Lima on: 08/30/2018 01:05 PM   Modules accepted: Orders

## 2018-08-30 NOTE — ED Triage Notes (Signed)
Pt sent by PCP for assessment of continuing PNA, states recent chest X-ray worsening. Pt was informed from Lab result yesterday detected Antibodies for Covid. Pt has had 2 prior negative Covis tests. Also c/o off and on fever, cough and SOB  X 1week.

## 2018-08-30 NOTE — Discharge Instructions (Addendum)
Continue antibiotic and steroids as prescribed to you by your primary care doctor.  Return to the emergency department if your symptoms worsen.

## 2018-08-30 NOTE — Assessment & Plan Note (Signed)
Her COVID antibody is positive. Her symptoms and chest x-ray are consistent with COVID pneumonitis. I have asked her to report to the emergency department to see if she would benefit from an intravenous course of remdesivir.

## 2018-08-30 NOTE — ED Provider Notes (Signed)
[facility] DEPT Provider Note   CSN: [id number] Arrival date & time: 08/30/18  1122    History   Chief Complaint Chief Complaint  Patient presents with   Shortness of Breath   Cough    HPI Rhonda [name] is a 48 y.o. female.     The history is provided by the patient.  Shortness of Breath Severity:  Moderate Onset quality:  Gradual Timing:  Intermittent Progression:  Worsening Chronicity:  New Context: activity and URI   Relieved by:  Nothing Worsened by:  Nothing Associated symptoms: cough and fever   Associated symptoms: no abdominal pain, no chest pain, no ear pain, no rash, no sore throat and no vomiting   Risk factors comment:  Tested positive for coronavirus antibodies, has not had a positive coronavirus test, has been sick for the last several weeks.  Sent by primary care doctor for possible treatment.   Past Medical History:  Diagnosis Date   Allergic rhinitis    Allergy    B12 deficiency    Carpal tunnel syndrome    Dysfunction of eustachian tube     Patient Active Problem List   Diagnosis Date Noted   Pneumonia due to COVID-19 virus 08/30/2018   Cough 08/29/2018   Pneumonitis 08/29/2018   Thrombocytosis ([place]) 11/06/2017   Acute viral conjunctivitis of both eyes 10/12/2016   Chiari malformation type II ([place]) 09/27/2016   Abnormal MRI of head 03/07/2016   Elevated blood sugar level 02/24/2016   Visual disturbance 02/24/2016   Gallstones and inflammation of gallbladder without obstruction 08/28/2015   Gastroesophageal reflux disease without esophagitis 08/19/2015   IBS (irritable bowel syndrome) 08/19/2015   Numbness and tingling of upper and lower extremities of both sides 04/14/2015   Routine general medical examination at a health care facility 07/14/2014   Vitamin B 12 deficiency 12/08/2008   CARPAL TUNNEL SYNDROME 05/14/2007   DE QUERVAIN'S TENOSYNOVITIS 05/14/2007   Allergic rhinitis  01/31/2007    Past Surgical History:  Procedure Laterality Date   BUNIONECTOMY     right    CHOLECYSTECTOMY N/A 09/25/2015   Procedure: LAPAROSCOPIC CHOLECYSTECTOMY WITH INTRAOPERATIVE CHOLANGIOGRAM;  Surgeon: Alphonsa Overall, MD;  Location: [facility];  Service: General;  Laterality: N/A;   TONSILLECTOMY       OB History   No obstetric history on file.      Home Medications    Prior to Admission medications   Medication Sig Start Date End Date Taking? Authorizing Provider  acetaminophen (TYLENOL) 325 MG tablet Take 325 mg by mouth every 4 (four) hours as needed for fever.   Yes [provider]  azithromycin (ZITHROMAX) 500 MG tablet Take 1 tablet (500 mg total) by mouth daily for 3 days. 08/29/18 09/01/18 Yes Janith Lima, MD  esomeprazole (NEXIUM) 40 MG capsule Take 1 capsule (40 mg total) by mouth daily at 12 noon. 04/27/18 08/30/18 Yes Marrian Salvage, FNP  methylPREDNISolone (MEDROL DOSEPAK) 4 MG TBPK tablet TAKE AS DIRECTED 08/29/18 09/04/18 Yes Janith Lima, MD  promethazine-dextromethorphan (PROMETHAZINE-DM) 6.25-15 MG/5ML syrup Take 5 mLs by mouth 4 (four) times daily as needed for cough. 08/20/18  Yes Lawyer, Harrell Gave, PA-C  cetirizine (ZYRTEC) 10 MG tablet Take 10 mg by mouth daily.    [provider]  neomycin-polymyxin-hydrocortisone (CORTISPORIN) OTIC solution Place 3 drops into both ears 4 (four) times daily. Patient not taking: Reported on 08/30/2018 08/17/18   Marrian Salvage, [place]    Family History Family History  Problem Relation Age of Onset   Heart disease Father    Hypertension Father    Diabetes Father    Hyperlipidemia Mother    Hypertension Mother    Heart disease Maternal Grandmother    Headache Neg Hx     Social History Social History   Tobacco Use   Smoking status: Never Smoker   Smokeless tobacco: Never Used  Substance Use Topics   Alcohol use: No   Drug use: No     Allergies   Bactrim ds  [sulfamethoxazole-trimethoprim], Cephalosporins, Oseltamivir phosphate, Amoxicillin, and Doxycycline   Review of Systems Review of Systems  Constitutional: Positive for fever. Negative for chills.  HENT: Negative for ear pain and sore throat.   Eyes: Negative for pain and visual disturbance.  Respiratory: Positive for cough and shortness of breath.   Cardiovascular: Negative for chest pain and palpitations.  Gastrointestinal: Negative for abdominal pain and vomiting.  Genitourinary: Negative for dysuria and hematuria.  Musculoskeletal: Negative for arthralgias and back pain.  Skin: Negative for color change and rash.  Neurological: Negative for seizures and syncope.  All other systems reviewed and are negative.    Physical Exam Updated Vital Signs  ED Triage Vitals [08/30/18 1146]  Enc Vitals Group     BP 121/74     Pulse Rate 70     Resp 20     Temp 97.8 F (36.6 C)     Temp Source Oral     SpO2 100 %     Weight      Height      Head Circumference      Peak Flow      Pain Score      Pain Loc      Pain Edu?      Excl. in [place]?     Physical Exam Vitals signs and nursing note reviewed.  Constitutional:      General: She is not in acute distress.    Appearance: She is well-developed.  HENT:     Head: Normocephalic and atraumatic.     Mouth/Throat:     Mouth: Mucous membranes are moist.     Pharynx: Oropharynx is clear.  Eyes:     Extraocular Movements: Extraocular movements intact.     Conjunctiva/sclera: Conjunctivae normal.     Pupils: Pupils are equal, round, and reactive to light.  Neck:     Musculoskeletal: Normal range of motion and neck supple.  Cardiovascular:     Rate and Rhythm: Normal rate and regular rhythm.     Pulses: Normal pulses.     Heart sounds: Normal heart sounds. No murmur.  Pulmonary:     Effort: Tachypnea present. No respiratory distress.     Breath sounds: Decreased breath sounds present.     Comments: Coarse breath sounds  throughout Abdominal:     Palpations: Abdomen is soft.     Tenderness: There is no abdominal tenderness.  Musculoskeletal:     Right lower leg: No edema.     Left lower leg: No edema.  Skin:    General: Skin is warm and dry.  Neurological:     Mental Status: She is alert.      ED Treatments / Results  Labs (all labs ordered are listed, but only abnormal results are displayed) Labs Reviewed  CBC WITH DIFFERENTIAL/PLATELET - Abnormal; Notable for the following components:      Result Value   Platelets 665 (*)    All other components within normal  limits  BASIC METABOLIC PANEL - Abnormal; Notable for the following components:   Potassium 3.2 (*)    Glucose, Bld 119 (*)    BUN 5 (*)    All other components within normal limits  SEDIMENTATION RATE - Abnormal; Notable for the following components:   Sed Rate 83 (*)    All other components within normal limits  C-REACTIVE PROTEIN - Abnormal; Notable for the following components:   CRP 6.3 (*)    All other components within normal limits  D-DIMER, QUANTITATIVE (NOT AT [facility]) - Abnormal; Notable for the following components:   D-Dimer, Quant 1.17 (*)    All other components within normal limits  FIBRINOGEN - Abnormal; Notable for the following components:   Fibrinogen 713 (*)    All other components within normal limits  HEPATIC FUNCTION PANEL - Abnormal; Notable for the following components:   Albumin 3.4 (*)    Indirect Bilirubin 1.0 (*)    All other components within normal limits  SARS CORONAVIRUS 2 (HOSPITAL ORDER, [facility])  CULTURE, BLOOD (ROUTINE X 2)  CULTURE, BLOOD (ROUTINE X 2)  HCG, QUANTITATIVE, PREGNANCY  FERRITIN  LACTIC ACID, PLASMA  LACTIC ACID, PLASMA  PROCALCITONIN  LACTATE DEHYDROGENASE  TRIGLYCERIDES    EKG None  Radiology Dg Chest 2 View  Result Date: 08/29/2018 CLINICAL DATA:  Cough, recent pneumonia EXAM: CHEST - 2 VIEW COMPARISON:  Radiograph 08/20/2018 FINDINGS:  Patchy airspace opacities are seen in a basilar and peripheral predominance. Vascularity is normal. Cardiomediastinal contours are unremarkable. No pneumothorax or effusion. No acute osseous or soft tissue abnormality. Cholecystectomy clips in the right upper quadrant. IMPRESSION: Increasing patchy airspace opacities are compatible with multifocal pneumonia including possible viral etiology. Electronically Signed   By: Lovena Le M.D.   On: 08/29/2018 16:01   Ct Angio Chest Pe W And/or Wo Contrast  Result Date: 08/30/2018 CLINICAL DATA:  Follow-up pneumonia. EXAM: CT ANGIOGRAPHY CHEST WITH CONTRAST TECHNIQUE: Multidetector CT imaging of the chest was performed using the standard protocol during bolus administration of intravenous contrast. Multiplanar CT image reconstructions and MIPs were obtained to evaluate the vascular anatomy. CONTRAST:  1110mL OMNIPAQUE IOHEXOL 350 MG/ML SOLN COMPARISON:  Chest x-ray 08/30/2018 FINDINGS: Cardiovascular: The heart is normal in size. No pericardial effusion. The aorta is normal in caliber. No dissection. No atherosclerotic calcifications. No definite coronary artery calcifications. The pulmonary arterial tree is fairly well opacified. No filling defects to suggest pulmonary embolism. Mediastinum/Nodes: Borderline enlarged mediastinal and hilar lymph nodes, likely inflammatory given the lung findings. The esophagus is grossly normal. Lungs/Pleura: There are patchy bilateral lower lobe peripheral areas of airspace consolidation suggesting multifocal pneumonia. No worrisome pulmonary lesions. No pleural effusions. Upper Abdomen: No significant upper abdominal findings. Suspect fatty infiltration of the liver. Musculoskeletal: No breast masses, supraclavicular or axillary adenopathy. The bony structures are intact. Review of the MIP images confirms the above findings. IMPRESSION: 1. Patchy bilateral lower lobe infiltrates. 2. Inflammatory/hyperplastic mediastinal and hilar lymph  nodes. 3. No CT findings suspicious for pulmonary embolism. 4. Normal thoracic aorta. Electronically Signed   By: Marijo Sanes M.D.   On: 08/30/2018 16:41   Dg Chest Portable 1 View  Result Date: 08/30/2018 CLINICAL DATA:  Fever EXAM: PORTABLE CHEST 1 VIEW COMPARISON:  08/29/2018 FINDINGS: Cardiomediastinal contours are normal. Similar patchy airspace opacities with a predominantly basilar and peripheral distribution. No pleural effusion or pneumothorax. IMPRESSION: No significant change in patchy bilateral airspace opacities suggestive of multifocal pneumonia. Electronically Signed  By: Davina Poke M.D.   On: 08/30/2018 13:05    Procedures Procedures (including critical care time)  Medications Ordered in ED Medications  iohexol (OMNIPAQUE) 350 MG/ML injection 100 mL (100 mLs Intravenous Contrast Given 08/30/18 1606)     Initial Impression / Assessment and Plan / ED Course  I have reviewed the triage vital signs and the nursing notes.  Pertinent labs & imaging results that were available during my care of the patient were reviewed by me and considered in my medical decision making (see chart for details).     DARIKA ILDEFONSO is a 48 year old female who presents to the ED for possible treatment for coronavirus.  Patient with normal vitals.  No fever.  Patient with ongoing shortness of breath and cough with exertion over the last several weeks.  Tested positive for coronavirus antibiotics at primary care doctor's office yesterday.  Inflammatory markers were mildly elevated.  Chest x-ray was concerning for multifocal pneumonia including viral etiology.  Patient has had 2- coronavirus tests previously.  Patient had negative RVP yesterday.  Patient was sent for possible IV redemonstrated her treatment.  Overall patient is asymptomatic at rest with some increased work of breathing however becomes symptomatic when she ambulates however does not develop hypoxia.  She has severe cough when she  starts to walk.  She has had decrease in her activities during this time.  Talked with medicine who recommends full panel of coronavirus labs.  Recommend that I discussed case with infectious disease to see if she meets any treatment protocol as patient does not have any hypoxia.  Chest x-ray continues to show airspace opacities consistent with a multifocal pneumonia including viral etiology.  Will get lab work and talk to infectious disease.  COVID test continues to be negative.  CT scan of the chest showed no PE.  Does show some patchy inflammation changes.  No significant lactic acidosis.  No significant leukocytosis.  Inflammatory markers overall unremarkable.  Talked with infectious disease on the phone, Dr. Baxter Flattery, and recommends that patient continue aZithromax which she is prescribed by her primary doctor.  Overall, infectious disease says she does not meet any inpatient criteria needs for treatment.  No hypoxia.  Overall normal work of breathing throughout my care.  Discharged in ED in good condition.  Given return precautions.  This chart was dictated using voice recognition software.  Despite best efforts to proofread,  errors can occur which can change the documentation meaning.    Final Clinical Impressions(s) / ED Diagnoses   Final diagnoses:  Shortness of breath  Viral URI with cough    ED Discharge Orders    None       Lennice Sites, DO 08/30/18 1727

## 2018-08-30 NOTE — Telephone Encounter (Signed)
Called to pt. No answer. Left her a detailed message with the message PCP sent via my chart.

## 2018-08-31 ENCOUNTER — Other Ambulatory Visit: Payer: Self-pay | Admitting: Internal Medicine

## 2018-08-31 DIAGNOSIS — R059 Cough, unspecified: Secondary | ICD-10-CM

## 2018-08-31 DIAGNOSIS — R05 Cough: Secondary | ICD-10-CM

## 2018-08-31 DIAGNOSIS — H539 Unspecified visual disturbance: Secondary | ICD-10-CM

## 2018-08-31 DIAGNOSIS — J189 Pneumonia, unspecified organism: Secondary | ICD-10-CM

## 2018-08-31 MED ORDER — AZITHROMYCIN 500 MG PO TABS: 500.0000 mg | ORAL_TABLET | Freq: Every day | ORAL | 0 refills | Status: AC

## 2018-08-31 NOTE — Telephone Encounter (Signed)
Pt contacted and informed of same.  Follow up appt has been made for next week to follow up on chest xray and for letter to return to work.

## 2018-08-31 NOTE — Telephone Encounter (Signed)
Stef-    I have sent another 3 days of the antibiotic to her pharmacy as requested. I assume we have her FMLA paperwork to be completed.  Ask her to come in to see me next week for a follow-up and a repeat chest x-ray.    TJ

## 2018-09-03 NOTE — Telephone Encounter (Signed)
I do not have these forms in my box, Do you still have them?

## 2018-09-04 LAB — CULTURE, BLOOD (ROUTINE X 2)
Culture: NO GROWTH
Culture: NO GROWTH
Special Requests: ADEQUATE

## 2018-09-04 NOTE — Telephone Encounter (Signed)
Stef completed

## 2018-09-05 ENCOUNTER — Other Ambulatory Visit: Payer: Self-pay

## 2018-09-05 ENCOUNTER — Ambulatory Visit (INDEPENDENT_AMBULATORY_CARE_PROVIDER_SITE_OTHER): Payer: 59 | Admitting: Internal Medicine

## 2018-09-05 ENCOUNTER — Encounter: Payer: Self-pay | Admitting: Internal Medicine

## 2018-09-05 ENCOUNTER — Ambulatory Visit (INDEPENDENT_AMBULATORY_CARE_PROVIDER_SITE_OTHER)
Admission: RE | Admit: 2018-09-05 | Discharge: 2018-09-05 | Disposition: A | Discharge status: Home / Self Care | Payer: 59 | Source: Ambulatory Visit | Attending: Internal Medicine | Admitting: Internal Medicine

## 2018-09-05 VITALS — BP 130/86 | HR 90 | Temp 98.4°F | Resp 16 | Ht 65.0 in | Wt 196.0 lb

## 2018-09-05 DIAGNOSIS — J181 Lobar pneumonia, unspecified organism: Secondary | ICD-10-CM

## 2018-09-05 DIAGNOSIS — J189 Pneumonia, unspecified organism: Secondary | ICD-10-CM

## 2018-09-05 DIAGNOSIS — E876 Hypokalemia: Secondary | ICD-10-CM

## 2018-09-05 NOTE — Patient Instructions (Signed)

## 2018-09-05 NOTE — Progress Notes (Signed)
Subjective:  Patient ID: Rhonda [name], female    DOB: 17-Aug-1970  Age: 48 y.o. MRN: [id number]  CC: Cough   HPI SHAYLEA UCCI presents for f/up - She has recently been treated for an atypical pneumonia.  She has completed a 6-day course of Zithromax and methylprednisolone.  She tells me she is feeling much better.  She has a persistent but improving cough that is rarely productive of whitish phlegm.  She denies chest pain, shortness of breath, wheezing, fever, chills, or night sweats.  Outpatient Medications Prior to Visit  Medication Sig Dispense Refill  . acetaminophen (TYLENOL) 325 MG tablet Take 325 mg by mouth every 4 (four) hours as needed for fever.    . cetirizine (ZYRTEC) 10 MG tablet Take 10 mg by mouth daily.    Marland Kitchen neomycin-polymyxin-hydrocortisone (CORTISPORIN) OTIC solution Place 3 drops into both ears 4 (four) times daily. 10 mL 0  . promethazine-dextromethorphan (PROMETHAZINE-DM) 6.25-15 MG/5ML syrup Take 5 mLs by mouth 4 (four) times daily as needed for cough. 120 mL 0  . esomeprazole (NEXIUM) 40 MG capsule Take 1 capsule (40 mg total) by mouth daily at 12 noon. 90 capsule 0   No facility-administered medications prior to visit.     ROS Review of Systems  Constitutional: Negative for chills, diaphoresis, fatigue and fever.  HENT: Negative.  Negative for sore throat.   Eyes: Negative.   Respiratory: Positive for cough. Negative for chest tightness, shortness of breath and wheezing.   Cardiovascular: Negative for chest pain, palpitations and leg swelling.  Gastrointestinal: Negative for abdominal pain, diarrhea, nausea and vomiting.  Endocrine: Negative.   Genitourinary: Negative.  Negative for difficulty urinating, dysuria and hematuria.  Musculoskeletal: Negative.  Negative for arthralgias and myalgias.  Skin: Negative.  Negative for color change and rash.  Neurological: Negative.  Negative for dizziness, weakness and light-headedness.  Hematological: Negative  for adenopathy. Does not bruise/bleed easily.  Psychiatric/Behavioral: Negative.     Objective:  BP 130/86 (BP Location: Left Arm, Patient Position: Sitting, Cuff Size: Normal)   Pulse 90   Temp 98.4 F (36.9 C) (Oral)   Resp 16   Ht 5\' 5"  (1.651 m)   Wt 196 lb (88.9 kg)   LMP 08/13/2018 Comment: NEG BETA 08/30/18  SpO2 96%   BMI 32.62 kg/m   BP Readings from Last 3 Encounters:  09/05/18 130/86  08/30/18 126/63  08/29/18 110/78    Wt Readings from Last 3 Encounters:  09/05/18 196 lb (88.9 kg)  08/29/18 198 lb 8 oz (90 kg)  08/20/18 196 lb (88.9 kg)    Physical Exam Constitutional:      Appearance: She is not ill-appearing or diaphoretic.  HENT:     Nose: Nose normal. No congestion.     Mouth/Throat:     Mouth: Mucous membranes are moist.  Eyes:     Conjunctiva/sclera: Conjunctivae normal.  Neck:     Musculoskeletal: Normal range of motion. No neck rigidity.  Cardiovascular:     Rate and Rhythm: Normal rate and regular rhythm.  Pulmonary:     Effort: Pulmonary effort is normal. No respiratory distress.     Breath sounds: No stridor. No wheezing, rhonchi or rales.  Abdominal:     General: Abdomen is flat. Bowel sounds are normal. There is no distension.     Palpations: There is no hepatomegaly or splenomegaly.     Tenderness: There is no abdominal tenderness.  Musculoskeletal: Normal range of motion.     Right  lower leg: No edema.     Left lower leg: No edema.  Lymphadenopathy:     Cervical: No cervical adenopathy.  Skin:    General: Skin is warm and dry.     Coloration: Skin is not jaundiced or pale.  Neurological:     General: No focal deficit present.     Mental Status: She is alert and oriented to person, place, and time. Mental status is at baseline.     Lab Results  Component Value Date   WBC 9.5 09/06/2018   HGB 13.9 09/06/2018   HCT 40.8 09/06/2018   PLT 523.0 (H) 09/06/2018   GLUCOSE 113 (H) 09/06/2018   CHOL 170 06/11/2015   TRIG 56  08/30/2018   HDL 55.90 06/11/2015   LDLCALC 107 (H) 06/11/2015   ALT 40 (H) 09/06/2018   AST 24 09/06/2018   NA 136 09/06/2018   K 3.3 (L) 09/06/2018   CL 99 09/06/2018   CREATININE 0.79 09/06/2018   BUN 6 09/06/2018   CO2 27 09/06/2018   TSH 0.49 01/18/2018   HGBA1C 5.1 09/27/2016    Dg Chest 2 View  Result Date: 09/06/2018 CLINICAL DATA:  Follow-up pneumonia EXAM: CHEST - 2 VIEW COMPARISON:  CTA chest dated 08/30/2018 FINDINGS: Multifocal patchy opacities in the bilateral lower lobes, similar to the prior. Subpleural/peripheral distribution. No pleural effusion or pneumothorax. The heart is normal in size. Visualized osseous structures are within normal limits. IMPRESSION: Multifocal pneumonia, grossly unchanged. As previously noted, viral etiologies remain possible. Electronically Signed   By: Julian Hy M.D.   On: 09/06/2018 02:59    Assessment & Plan:   Chenell was seen today for cough.  Diagnoses and all orders for this visit:  Pneumonia of both lower lobes due to infectious organism [facility])- Based on her symptoms and exam she is markedly improved.  Her platelet count is normalizing and her white cell count is normal.  Her chest x-ray is essentially unchanged which is reassuring that the infection is not worsening and she has not developed any complications.  She will let me know if she develops any new or worsening symptoms. -     DG Chest 2 View; Future -     CBC with Differential/Platelet; Future -     Basic metabolic panel; Future -     Hepatic function panel; Future  Acute hypokalemia -     potassium chloride SA (K-DUR) 20 MEQ tablet; Take 1 tablet (20 mEq total) by mouth 2 (two) times daily.   I am having Rhonda [name] start on potassium chloride SA. I am also having her maintain her esomeprazole, neomycin-polymyxin-hydrocortisone, promethazine-dextromethorphan, cetirizine, and acetaminophen.  Meds ordered this encounter  Medications  . potassium chloride SA  (K-DUR) 20 MEQ tablet    Sig: Take 1 tablet (20 mEq total) by mouth 2 (two) times daily.    Dispense:  180 tablet    Refill:  0     Follow-up: Return if symptoms worsen or fail to improve.  Scarlette Calico, MD

## 2018-09-06 ENCOUNTER — Encounter: Payer: Self-pay | Admitting: Internal Medicine

## 2018-09-06 ENCOUNTER — Other Ambulatory Visit (INDEPENDENT_AMBULATORY_CARE_PROVIDER_SITE_OTHER): Payer: 59

## 2018-09-06 DIAGNOSIS — J181 Lobar pneumonia, unspecified organism: Secondary | ICD-10-CM | POA: Diagnosis not present

## 2018-09-06 DIAGNOSIS — J189 Pneumonia, unspecified organism: Secondary | ICD-10-CM

## 2018-09-06 LAB — BASIC METABOLIC PANEL
BUN: 6 mg/dL (ref 6–23)
CO2: 27 mEq/L (ref 19–32)
Calcium: 9.2 mg/dL (ref 8.4–10.5)
Chloride: 99 mEq/L (ref 96–112)
Creatinine, Ser: 0.79 mg/dL (ref 0.40–1.20)
GFR: 93.61 mL/min (ref >60.00)
Glucose, Bld: 113 mg/dL — ABNORMAL HIGH (ref 70–99)
Potassium: 3.3 mEq/L — ABNORMAL LOW (ref 3.5–5.1)
Sodium: 136 mEq/L (ref 135–145)

## 2018-09-06 LAB — CBC WITH DIFFERENTIAL/PLATELET
Basophils Absolute: 0.1 10*3/uL (ref 0.0–0.1)
Basophils Relative: 0.8 % (ref 0.0–3.0)
Eosinophils Absolute: 0.1 10*3/uL (ref 0.0–0.7)
Eosinophils Relative: 1.3 % (ref 0.0–5.0)
HCT: 40.8 % (ref 36.0–46.0)
Hemoglobin: 13.9 g/dL (ref 12.0–15.0)
Lymphocytes Relative: 30 % (ref 12.0–46.0)
Lymphs Abs: 2.9 10*3/uL (ref 0.7–4.0)
MCHC: 34 g/dL (ref 30.0–36.0)
MCV: 90.3 fl (ref 78.0–100.0)
Monocytes Absolute: 0.6 10*3/uL (ref 0.1–1.0)
Monocytes Relative: 6.8 % (ref 3.0–12.0)
Neutro Abs: 5.8 10*3/uL (ref 1.4–7.7)
Neutrophils Relative %: 61.1 % (ref 43.0–77.0)
Platelets: 523 10*3/uL — ABNORMAL HIGH (ref 150.0–400.0)
RBC: 4.52 Mil/uL (ref 3.87–5.11)
RDW: 13.8 % (ref 11.5–15.5)
WBC: 9.5 10*3/uL (ref 4.0–10.5)

## 2018-09-06 LAB — HEPATIC FUNCTION PANEL
ALT: 40 U/L — ABNORMAL HIGH (ref 0–35)
AST: 24 U/L (ref 0–37)
Albumin: 3.8 g/dL (ref 3.5–5.2)
Alkaline Phosphatase: 88 U/L (ref 39–117)
Bilirubin, Direct: 0.3 mg/dL (ref 0.0–0.3)
Total Bilirubin: 1.1 mg/dL (ref 0.2–1.2)
Total Protein: 7.6 g/dL (ref 6.0–8.3)

## 2018-09-06 MED ORDER — POTASSIUM CHLORIDE CRYS ER 20 MEQ PO TBCR: 20.0000 meq | EXTENDED_RELEASE_TABLET | Freq: Two times a day (BID) | ORAL | 0 refills | Status: DC

## 2018-09-11 ENCOUNTER — Other Ambulatory Visit: Payer: Self-pay | Admitting: Internal Medicine

## 2018-09-11 ENCOUNTER — Encounter: Payer: Self-pay | Admitting: Internal Medicine

## 2018-09-11 DIAGNOSIS — U071 COVID-19: Secondary | ICD-10-CM

## 2018-09-11 DIAGNOSIS — J189 Pneumonia, unspecified organism: Secondary | ICD-10-CM

## 2018-09-11 DIAGNOSIS — J1282 Pneumonia due to coronavirus disease 2019: Secondary | ICD-10-CM

## 2018-09-11 MED ORDER — AZITHROMYCIN 500 MG PO TABS: 500.0000 mg | ORAL_TABLET | Freq: Every day | ORAL | 0 refills | Status: AC

## 2018-09-11 MED ORDER — METHYLPREDNISOLONE 4 MG PO TBPK: ORAL_TABLET | ORAL | 0 refills | Status: AC

## 2018-09-15 NOTE — Progress Notes (Signed)
HEMATOLOGY/ONCOLOGY CONSULTATION NOTE  Date of Service: 09/17/2018  Patient Care Team: Janith Lima, MD as PCP - General (Internal Medicine)  CHIEF COMPLAINTS/PURPOSE OF CONSULTATION:  Vitamin B12 deficiency  thrombocytosis  HISTORY OF PRESENTING ILLNESS:   Rhonda [name] is a wonderful 48 y.o. female who has been referred to [place] by Dr. Walden Field for evaluation and management of Vitamin B12 deficiency and transient thrombocytosis. The pt reports that she  is doing well overall.   The pt reports an earache was what prompted her to go to the hospital. She reports she is still fatigued and experiences SOB when she exerts herself. Her SOB is uncharacteristic. She is almost finished with her first round of antibiotics and steroids and is about to begin another. When she completes her second round she will have another Chest XR ordered by her PCP. She believes that she picked up COVID-19 from work. Pt has been getting monthly Vitamin B12 shots since 2010. She feels that she can tell when her Vitamin B12 is low as she feels fatigued and sleeps a lot.   Of note prior to the patient's visit today, pt has had Chest XR completed on 09/05/2018 with results revealing "Multifocal pneumonia, grossly unchanged. As previously noted, viral etiologies remain possible."   Most recent lab results (09/06/2018) of CBC with CMP is as follows: all values are WNL except for Platelets at 523.0K, Potassium at 3.3, Glucose at 113, ALT at 40.  On review of systems, pt reports SOB, fatigue, night sweats and denies runny nose, fever, chills, back pain, abdominal pain and any other symptoms.   On Social Hx the pt reports she has never smoked.  On Family Hx the pt reports an uncle Dx with colon cancer >50 y/o.    MEDICAL HISTORY:  Past Medical History:  Diagnosis Date   Allergic rhinitis    Allergy    B12 deficiency    Carpal tunnel syndrome    Dysfunction of eustachian tube     SURGICAL HISTORY: Past  Surgical History:  Procedure Laterality Date   BUNIONECTOMY     right    CHOLECYSTECTOMY N/A 09/25/2015   Procedure: LAPAROSCOPIC CHOLECYSTECTOMY WITH INTRAOPERATIVE CHOLANGIOGRAM;  Surgeon: Alphonsa Overall, MD;  Location: [facility];  Service: General;  Laterality: N/A;   TONSILLECTOMY      SOCIAL HISTORY: Social History   Socioeconomic History   Marital status: Single    Spouse name: Not on file   Number of children: 0   Years of education: Assoc   Highest education level: Not on file  Occupational History   Occupation: Geographical information systems officer strain: Not on file   Food insecurity    Worry: Not on file    Inability: Not on file   Transportation needs    Medical: Not on file    Non-medical: Not on file  Tobacco Use   Smoking status: Never Smoker   Smokeless tobacco: Never Used  Substance and Sexual Activity   Alcohol use: No   Drug use: No   Sexual activity: Not on file  Lifestyle   Physical activity    Days per week: Not on file    Minutes per session: Not on file   Stress: Not on file  Relationships   Social connections    Talks on phone: Not on file    Gets together: Not on file    Attends religious service: Not on file    Active member  of club or organization: Not on file    Attends meetings of clubs or organizations: Not on file    Relationship status: Not on file   Intimate partner violence    Fear of current or ex partner: Not on file    Emotionally abused: Not on file    Physically abused: Not on file    Forced sexual activity: Not on file  Other Topics Concern   Not on file  Social History Narrative   College grad   Single   Bought her own home in '09   Occupation: Programme researcher, broadcasting/film/video and cleaning   Fun: Resting, yard work   Denies religious beliefs effecting health care.    Drinks caffeine daily     FAMILY HISTORY: Family History  Problem Relation Age of Onset   Heart disease Father    Hypertension Father      Diabetes Father    Hyperlipidemia Mother    Hypertension Mother    Heart disease Maternal Grandmother    Headache Neg Hx     ALLERGIES:  is allergic to bactrim ds [sulfamethoxazole-trimethoprim]; cephalosporins; oseltamivir phosphate; amoxicillin; and doxycycline.  MEDICATIONS:  Current Outpatient Medications  Medication Sig Dispense Refill   acetaminophen (TYLENOL) 325 MG tablet Take 325 mg by mouth every 4 (four) hours as needed for fever.     cetirizine (ZYRTEC) 10 MG tablet Take 10 mg by mouth daily.     Cyanocobalamin 1000 MCG/ML LIQD Place 2,000 mcg under the tongue daily. 60 mL 3   esomeprazole (NEXIUM) 40 MG capsule Take 1 capsule (40 mg total) by mouth daily at 12 noon. 90 capsule 0   methylPREDNISolone (MEDROL DOSEPAK) 4 MG TBPK tablet TAKE AS DIRECTED 21 tablet 0   neomycin-polymyxin-hydrocortisone (CORTISPORIN) OTIC solution Place 3 drops into both ears 4 (four) times daily. 10 mL 0   potassium chloride SA (K-DUR) 20 MEQ tablet Take 1 tablet (20 mEq total) by mouth 2 (two) times daily. 180 tablet 0   promethazine-dextromethorphan (PROMETHAZINE-DM) 6.25-15 MG/5ML syrup Take 5 mLs by mouth 4 (four) times daily as needed for cough. 120 mL 0   No current facility-administered medications for this visit.     REVIEW OF SYSTEMS:    A 10+ POINT REVIEW OF SYSTEMS WAS OBTAINED including neurology, dermatology, psychiatry, cardiac, respiratory, lymph, extremities, GI, GU, Musculoskeletal, constitutional, breasts, reproductive, HEENT.  All pertinent positives are noted in the HPI.  All others are negative.   PHYSICAL EXAMINATION: ECOG PERFORMANCE STATUS: 1 - Symptomatic but completely ambulatory  . Vitals:   09/17/18 1350  BP: 114/67  Pulse: 85  Resp: 18  Temp: 97.9 F (36.6 C)  SpO2: 100%   Filed Weights   09/17/18 1350  Weight: 197 lb (89.4 kg)   .Body mass index is 32.78 kg/m.  GENERAL:alert, in no acute distress and comfortable SKIN: no acute  rashes, no significant lesions EYES: conjunctiva are pink and non-injected, sclera anicteric OROPHARYNX: MMM, no exudates, no oropharyngeal erythema or ulceration NECK: supple, no JVD LYMPH:  no palpable lymphadenopathy in the cervical, axillary or inguinal regions LUNGS: clear to auscultation b/l with normal respiratory effort HEART: regular rate & rhythm ABDOMEN:  normoactive bowel sounds , non tender, not distended. Extremity: no pedal edema PSYCH: alert & oriented x 3 with fluent speech NEURO: no focal motor/sensory deficits  LABORATORY DATA:  I have reviewed the data as listed  . CBC Latest Ref Rng & Units 09/17/2018 09/06/2018 08/30/2018  WBC 4.0 - 10.5 K/uL 12.9(H) 9.5  6.3  Hemoglobin 12.0 - 15.0 g/dL 13.8 13.9 13.1  Hematocrit 36.0 - 46.0 % 40.7 40.8 40.2  Platelets 150 - 400 K/uL 355 523.0(H) 665(H)    . CMP Latest Ref Rng & Units 09/06/2018 08/30/2018 08/29/2018  Glucose 70 - 99 mg/dL 113(H) 119(H) 97  BUN 6 - 23 mg/dL 6 5(L) 5(L)  Creatinine 0.40 - 1.20 mg/dL 0.79 0.77 0.79  Sodium 135 - 145 mEq/L 136 139 137  Potassium 3.5 - 5.1 mEq/L 3.3(L) 3.2(L) 3.6  Chloride 96 - 112 mEq/L 99 100 99  CO2 19 - 32 mEq/L 27 27 29   Calcium 8.4 - 10.5 mg/dL 9.2 9.3 9.6  Total Protein 6.0 - 8.3 g/dL 7.6 8.0 7.9  Total Bilirubin 0.2 - 1.2 mg/dL 1.1 1.2 0.9  Alkaline Phos 39 - 117 U/L 88 82 82  AST 0 - 37 U/L 24 33 42(H)  ALT 0 - 35 U/L 40(H) 36 34     RADIOGRAPHIC STUDIES: I have personally reviewed the radiological images as listed and agreed with the findings in the report. Dg Chest 2 View  Result Date: 09/06/2018 CLINICAL DATA:  Follow-up pneumonia EXAM: CHEST - 2 VIEW COMPARISON:  CTA chest dated 08/30/2018 FINDINGS: Multifocal patchy opacities in the bilateral lower lobes, similar to the prior. Subpleural/peripheral distribution. No pleural effusion or pneumothorax. The heart is normal in size. Visualized osseous structures are within normal limits. IMPRESSION: Multifocal pneumonia,  grossly unchanged. As previously noted, viral etiologies remain possible. Electronically Signed   By: Julian Hy M.D.   On: 09/06/2018 02:59   Dg Chest 2 View  Result Date: 08/29/2018 CLINICAL DATA:  Cough, recent pneumonia EXAM: CHEST - 2 VIEW COMPARISON:  Radiograph 08/20/2018 FINDINGS: Patchy airspace opacities are seen in a basilar and peripheral predominance. Vascularity is normal. Cardiomediastinal contours are unremarkable. No pneumothorax or effusion. No acute osseous or soft tissue abnormality. Cholecystectomy clips in the right upper quadrant. IMPRESSION: Increasing patchy airspace opacities are compatible with multifocal pneumonia including possible viral etiology. Electronically Signed   By: Lovena Le M.D.   On: 08/29/2018 16:01   Dg Chest 2 View  Result Date: 08/20/2018 CLINICAL DATA:  Ear ache. EXAM: CHEST - 2 VIEW COMPARISON:  No recent prior. FINDINGS: Mediastinum hilar structures normal. Very mild bilateral interstitial prominence noted. An active process such as mild pneumonitis cannot be excluded. Mild bibasilar subsegmental atelectasis. No pleural effusion or pneumothorax. No acute bony abnormality. Surgical clips upper abdomen. IMPRESSION: Mild bilateral pulmonary interstitial prominence noted. An active process such as pneumonitis cannot be excluded. Mild bibasilar subsegmental atelectasis. Electronically Signed   By: Marcello Moores  Register   On: 08/20/2018 15:33   Ct Angio Chest Pe W And/or Wo Contrast  Result Date: 08/30/2018 CLINICAL DATA:  Follow-up pneumonia. EXAM: CT ANGIOGRAPHY CHEST WITH CONTRAST TECHNIQUE: Multidetector CT imaging of the chest was performed using the standard protocol during bolus administration of intravenous contrast. Multiplanar CT image reconstructions and MIPs were obtained to evaluate the vascular anatomy. CONTRAST:  180mL OMNIPAQUE IOHEXOL 350 MG/ML SOLN COMPARISON:  Chest x-ray 08/30/2018 FINDINGS: Cardiovascular: The heart is normal in size. No  pericardial effusion. The aorta is normal in caliber. No dissection. No atherosclerotic calcifications. No definite coronary artery calcifications. The pulmonary arterial tree is fairly well opacified. No filling defects to suggest pulmonary embolism. Mediastinum/Nodes: Borderline enlarged mediastinal and hilar lymph nodes, likely inflammatory given the lung findings. The esophagus is grossly normal. Lungs/Pleura: There are patchy bilateral lower lobe peripheral areas of airspace consolidation suggesting multifocal  pneumonia. No worrisome pulmonary lesions. No pleural effusions. Upper Abdomen: No significant upper abdominal findings. Suspect fatty infiltration of the liver. Musculoskeletal: No breast masses, supraclavicular or axillary adenopathy. The bony structures are intact. Review of the MIP images confirms the above findings. IMPRESSION: 1. Patchy bilateral lower lobe infiltrates. 2. Inflammatory/hyperplastic mediastinal and hilar lymph nodes. 3. No CT findings suspicious for pulmonary embolism. 4. Normal thoracic aorta. Electronically Signed   By: Marijo Sanes M.D.   On: 08/30/2018 16:41   Dg Chest Portable 1 View  Result Date: 08/30/2018 CLINICAL DATA:  Fever EXAM: PORTABLE CHEST 1 VIEW COMPARISON:  08/29/2018 FINDINGS: Cardiomediastinal contours are normal. Similar patchy airspace opacities with a predominantly basilar and peripheral distribution. No pleural effusion or pneumothorax. IMPRESSION: No significant change in patchy bilateral airspace opacities suggestive of multifocal pneumonia. Electronically Signed   By: Davina Poke M.D.   On: 08/30/2018 13:05    ASSESSMENT & PLAN:   1) B12 deficiency- r/o pernicious anemia ? etiology 2) Thrombocytosis - r/o clonal thrombocytosis PLAN -Discussed patient's most recent labs from 09/06/2018 CBC with CMP, all values are WNL except for Platelets at 523.0K, Potassium at 3.3, Glucose at 113, ALT at 40. -Discussed that there is a possibility that her  increased platelets could be due to an inflammatory process created by her recent respiratory issues. Will send labs to r/o clonal thrombocytosis. -Discussed her continued low Vitamin B12 counts could be better treated with sublingual Vitamin B12, as opposed to her monthly shots.  -Rx sublingual Vitamin B12 daily - 2000 mcg daily. -antiparietal cell and anti IF ab to r/p pernicious anemia -Will order new labs and compare in four months.   . Orders Placed This Encounter  Procedures   CBC with Differential/Platelet    Standing Status:   Future    Number of Occurrences:   1    Standing Expiration Date:   10/22/2019   Vitamin B12    Standing Status:   Future    Number of Occurrences:   1    Standing Expiration Date:   09/17/2019   Anti-parietal antibody    Standing Status:   Future    Number of Occurrences:   1    Standing Expiration Date:   09/17/2019   JAK2 (including V617F and Exon 12), MPL, and CALR-Next Generation Sequencing    Standing Status:   Future    Number of Occurrences:   1    Standing Expiration Date:   09/17/2019    FOLLOW UP: Labs today B12 injection today then will cancel subsequent appointments RTC with Dr Irene Limbo with labs in 4 months  All of the patients questions were answered with apparent satisfaction. The patient knows to call the clinic with any problems, questions or concerns.  I spent 25 counseling the patient face to face. The total time spent in the appointment was 17 and more than 50% was on counseling and direct patient cares.    Sullivan Lone MD [place] AAHIVMS [facility] Hematology/Oncology Physician [facility]  (Office):       [phone] (Work cell):  [phone] (Fax):           [phone]  09/17/2018 4:32 PM  I, Yevette Edwards, am acting as a scribe for Dr. Sullivan Lone.   .I have reviewed the above documentation for accuracy and completeness, and I agree with the above. Brunetta Genera MD

## 2018-09-17 ENCOUNTER — Other Ambulatory Visit: Payer: Self-pay

## 2018-09-17 ENCOUNTER — Inpatient Hospital Stay (HOSPITAL_BASED_OUTPATIENT_CLINIC_OR_DEPARTMENT_OTHER): Payer: 59 | Admitting: Hematology

## 2018-09-17 ENCOUNTER — Inpatient Hospital Stay: Payer: 59 | Attending: Internal Medicine

## 2018-09-17 ENCOUNTER — Telehealth: Payer: Self-pay | Admitting: Hematology

## 2018-09-17 ENCOUNTER — Inpatient Hospital Stay: Payer: 59

## 2018-09-17 VITALS — BP 114/67 | HR 85 | Temp 97.9°F | Resp 18 | Ht 65.0 in | Wt 197.0 lb

## 2018-09-17 DIAGNOSIS — D473 Essential (hemorrhagic) thrombocythemia: Secondary | ICD-10-CM | POA: Diagnosis present

## 2018-09-17 DIAGNOSIS — D75839 Thrombocytosis, unspecified: Secondary | ICD-10-CM

## 2018-09-17 DIAGNOSIS — E538 Deficiency of other specified B group vitamins: Secondary | ICD-10-CM | POA: Insufficient documentation

## 2018-09-17 DIAGNOSIS — Q0701 Arnold-Chiari syndrome with spina bifida: Secondary | ICD-10-CM | POA: Diagnosis not present

## 2018-09-17 LAB — CBC WITH DIFFERENTIAL/PLATELET
Abs Immature Granulocytes: 0.05 10*3/uL (ref 0.00–0.07)
Basophils Absolute: 0 10*3/uL (ref 0.0–0.1)
Basophils Relative: 0 %
Eosinophils Absolute: 0.1 10*3/uL (ref 0.0–0.5)
Eosinophils Relative: 0 %
HCT: 40.7 % (ref 36.0–46.0)
Hemoglobin: 13.8 g/dL (ref 12.0–15.0)
Immature Granulocytes: 0 %
Lymphocytes Relative: 20 %
Lymphs Abs: 2.5 10*3/uL (ref 0.7–4.0)
MCH: 30.7 pg (ref 26.0–34.0)
MCHC: 33.9 g/dL (ref 30.0–36.0)
MCV: 90.4 fL (ref 80.0–100.0)
Monocytes Absolute: 0.7 10*3/uL (ref 0.1–1.0)
Monocytes Relative: 5 %
Neutro Abs: 9.6 10*3/uL — ABNORMAL HIGH (ref 1.7–7.7)
Neutrophils Relative %: 75 %
Platelets: 355 10*3/uL (ref 150–400)
RBC: 4.5 MIL/uL (ref 3.87–5.11)
RDW: 14.2 % (ref 11.5–15.5)
WBC: 12.9 10*3/uL — ABNORMAL HIGH (ref 4.0–10.5)
nRBC: 0 % (ref 0.0–0.2)

## 2018-09-17 LAB — VITAMIN B12: Vitamin B-12: 344 pg/mL (ref 180–914)

## 2018-09-17 MED ORDER — CYANOCOBALAMIN 1000 MCG/ML IJ SOLN
INTRAMUSCULAR | Status: AC
  Filled 2018-09-17: qty 1

## 2018-09-17 MED ORDER — CYANOCOBALAMIN 1000 MCG/ML PO LIQD: 2000.0000 ug | Freq: Every day | ORAL | 3 refills | Status: DC

## 2018-09-17 MED ORDER — CYANOCOBALAMIN 1000 MCG/ML IJ SOLN
1000.0000 ug | Freq: Once | INTRAMUSCULAR | Status: AC
  Administered 2018-09-17: 16:00:00 1000 ug via INTRAMUSCULAR

## 2018-09-17 NOTE — Patient Instructions (Signed)

## 2018-09-17 NOTE — Telephone Encounter (Signed)
Scheduled appt per 8/24 los. ° °Printed calendar and avs. °

## 2018-09-18 LAB — ANTI-PARIETAL ANTIBODY: Parietal Cell Antibody-IgG: 7.2 Units (ref 0.0–20.0)

## 2018-10-12 LAB — JAK2 (INCLUDING V617F AND EXON 12), MPL,& CALR-NEXT GEN SEQ

## 2018-10-18 ENCOUNTER — Ambulatory Visit: Payer: 59

## 2018-10-19 ENCOUNTER — Other Ambulatory Visit: Payer: Self-pay | Admitting: *Deleted

## 2018-10-19 MED ORDER — CYANOCOBALAMIN 1000 MCG SL SUBL: 2000.0000 ug | SUBLINGUAL_TABLET | Freq: Every day | SUBLINGUAL | 3 refills | Status: AC

## 2018-10-19 NOTE — Telephone Encounter (Signed)
[facility] called - they do not have liquid Cyanocobalamin for SL. Does MD want to change to SL tablets? Dr.Kale ok's change to tablets. New RX sent

## 2018-11-13 ENCOUNTER — Telehealth: Payer: Self-pay | Admitting: *Deleted

## 2018-11-13 NOTE — Telephone Encounter (Signed)
Patient called, LVVM: Patient states she needs to come in and get a B12 injection this week. She knows its low. The multivits ordered are not working. She is very tired and slept all weekend. Per Dr.Kale, please advise pt to be evaluated by PCP as fatigue has many causes other than B12 deficiency and to continue B12 as directed in August appt. He will evaluate  Contacted patient. She states she will contact her PCP regarding fatigue. She states she has not been taking B12 2000 mcg SL as directed by Dr.Kale in appt in August. She said when she asked the pharmacist about medication in August, they told her it was a vitamin and wouldn't do much. Advised her that B12 can be purchased OTC and that per Dr.Kale, she should start taking it as directed in August. He will assess B12 levels in December with labs.  Patient verbalized understanding and will start B12 SL, 2035mcg ASAP.

## 2018-11-16 ENCOUNTER — Other Ambulatory Visit: Payer: 59

## 2018-11-16 ENCOUNTER — Ambulatory Visit: Payer: 59

## 2018-11-19 ENCOUNTER — Other Ambulatory Visit: Payer: Self-pay | Admitting: Registered"

## 2018-11-19 DIAGNOSIS — Z20822 Contact with and (suspected) exposure to covid-19: Secondary | ICD-10-CM

## 2018-11-21 LAB — NOVEL CORONAVIRUS, NAA: SARS-CoV-2, NAA: NOT DETECTED

## 2018-11-27 ENCOUNTER — Ambulatory Visit (INDEPENDENT_AMBULATORY_CARE_PROVIDER_SITE_OTHER): Payer: 59 | Admitting: Internal Medicine

## 2018-11-27 ENCOUNTER — Other Ambulatory Visit (INDEPENDENT_AMBULATORY_CARE_PROVIDER_SITE_OTHER): Payer: 59

## 2018-11-27 ENCOUNTER — Encounter: Payer: Self-pay | Admitting: Internal Medicine

## 2018-11-27 ENCOUNTER — Other Ambulatory Visit: Payer: Self-pay

## 2018-11-27 ENCOUNTER — Ambulatory Visit (INDEPENDENT_AMBULATORY_CARE_PROVIDER_SITE_OTHER)
Admission: RE | Admit: 2018-11-27 | Discharge: 2018-11-27 | Disposition: A | Discharge status: Home / Self Care | Payer: 59 | Source: Ambulatory Visit | Attending: Internal Medicine | Admitting: Internal Medicine

## 2018-11-27 VITALS — BP 114/70 | HR 103 | Temp 98.1°F | Ht 65.0 in | Wt 202.2 lb

## 2018-11-27 DIAGNOSIS — J1289 Other viral pneumonia: Secondary | ICD-10-CM

## 2018-11-27 DIAGNOSIS — E876 Hypokalemia: Secondary | ICD-10-CM

## 2018-11-27 DIAGNOSIS — J1282 Pneumonia due to coronavirus disease 2019: Secondary | ICD-10-CM

## 2018-11-27 DIAGNOSIS — E538 Deficiency of other specified B group vitamins: Secondary | ICD-10-CM

## 2018-11-27 DIAGNOSIS — Z23 Encounter for immunization: Secondary | ICD-10-CM | POA: Diagnosis not present

## 2018-11-27 DIAGNOSIS — Z0000 Encounter for general adult medical examination without abnormal findings: Secondary | ICD-10-CM

## 2018-11-27 DIAGNOSIS — U071 COVID-19: Secondary | ICD-10-CM

## 2018-11-27 LAB — COMPREHENSIVE METABOLIC PANEL WITH GFR
ALT: 15 U/L (ref 0–35)
AST: 17 U/L (ref 0–37)
Albumin: 3.4 g/dL — ABNORMAL LOW (ref 3.5–5.2)
Alkaline Phosphatase: 68 U/L (ref 39–117)
BUN: 6 mg/dL (ref 6–23)
CO2: 30 meq/L (ref 19–32)
Calcium: 8.3 mg/dL — ABNORMAL LOW (ref 8.4–10.5)
Chloride: 103 meq/L (ref 96–112)
Creatinine, Ser: 0.78 mg/dL (ref 0.40–1.20)
GFR: 94.9 mL/min
Glucose, Bld: 99 mg/dL (ref 70–99)
Potassium: 3.4 meq/L — ABNORMAL LOW (ref 3.5–5.1)
Sodium: 139 meq/L (ref 135–145)
Total Bilirubin: 0.6 mg/dL (ref 0.2–1.2)
Total Protein: 5.5 g/dL — ABNORMAL LOW (ref 6.0–8.3)

## 2018-11-27 LAB — CBC WITH DIFFERENTIAL/PLATELET
Basophils Absolute: 0.1 10*3/uL (ref 0.0–0.1)
Basophils Relative: 1.3 % (ref 0.0–3.0)
Eosinophils Absolute: 0.7 10*3/uL (ref 0.0–0.7)
Eosinophils Relative: 7.5 % — ABNORMAL HIGH (ref 0.0–5.0)
HCT: 45.2 % (ref 36.0–46.0)
Hemoglobin: 15.3 g/dL — ABNORMAL HIGH (ref 12.0–15.0)
Lymphocytes Relative: 32.7 % (ref 12.0–46.0)
Lymphs Abs: 3.1 10*3/uL (ref 0.7–4.0)
MCHC: 33.8 g/dL (ref 30.0–36.0)
MCV: 92.7 fl (ref 78.0–100.0)
Monocytes Absolute: 0.6 10*3/uL (ref 0.1–1.0)
Monocytes Relative: 5.9 % (ref 3.0–12.0)
Neutro Abs: 5 10*3/uL (ref 1.4–7.7)
Neutrophils Relative %: 52.6 % (ref 43.0–77.0)
Platelets: 448 10*3/uL — ABNORMAL HIGH (ref 150.0–400.0)
RBC: 4.88 Mil/uL (ref 3.87–5.11)
RDW: 14.2 % (ref 11.5–15.5)
WBC: 9.4 10*3/uL (ref 4.0–10.5)

## 2018-11-27 LAB — LIPID PANEL
Cholesterol: 173 mg/dL (ref 0–200)
HDL: 56.3 mg/dL (ref >39.00)
LDL Cholesterol: 97 mg/dL (ref 0–99)
NonHDL: 116.4
Total CHOL/HDL Ratio: 3
Triglycerides: 99 mg/dL (ref 0.0–149.0)
VLDL: 19.8 mg/dL (ref 0.0–40.0)

## 2018-11-27 MED ORDER — POTASSIUM CHLORIDE CRYS ER 20 MEQ PO TBCR: 20.0000 meq | EXTENDED_RELEASE_TABLET | Freq: Two times a day (BID) | ORAL | 1 refills | Status: DC

## 2018-11-27 MED ORDER — CYANOCOBALAMIN 1000 MCG/ML IJ SOLN
1000.0000 ug | Freq: Once | INTRAMUSCULAR | Status: AC
  Administered 2018-11-27: 1000 ug via INTRAMUSCULAR

## 2018-11-27 NOTE — Progress Notes (Signed)
Subjective:  Patient ID: [name], female    DOB: [birth year]  Age: 48 y.o. MRN: [id number]  CC: Annual Exam   HPI MICKIE ZIEBELL presents for a CPX.  She feels like she has recovered from COVID-19 infection for the most part.  Her cough is markedly improved. She has a mild nonproductive cough once or twice every few days.  She denies fever, chills, shortness of breath, night sweats, fatigue, or weight loss.  She wants to repeat her chest x-ray to make sure that it has normalized.  Outpatient Medications Prior to Visit  Medication Sig Dispense Refill  . acetaminophen (TYLENOL) 325 MG tablet Take 325 mg by mouth every 4 (four) hours as needed for fever.    . clotrimazole-betamethasone (LOTRISONE) cream APP EXT AA BID PRN    . loteprednol (ALREX) 0.2 % SUSP Alrex 0.2 % eye drops,suspension    . terconazole (TERAZOL 3) 0.8 % vaginal cream terconazole 0.8 % vaginal cream  INSERT 1 APPLICATORFUL VAGINALLY QD FOR 7 DAYS UTD    . potassium chloride SA (K-DUR) 20 MEQ tablet Take 1 tablet (20 mEq total) by mouth 2 (two) times daily. 180 tablet 0  . esomeprazole (NEXIUM) 40 MG capsule Take 1 capsule (40 mg total) by mouth daily at 12 noon. 90 capsule 0  . cetirizine (ZYRTEC) 10 MG tablet Take 10 mg by mouth daily.    Marland Kitchen neomycin-polymyxin-hydrocortisone (CORTISPORIN) OTIC solution Place 3 drops into both ears 4 (four) times daily. 10 mL 0  . promethazine-dextromethorphan (PROMETHAZINE-DM) 6.25-15 MG/5ML syrup Take 5 mLs by mouth 4 (four) times daily as needed for cough. 120 mL 0   No facility-administered medications prior to visit.     ROS Review of Systems  Constitutional: Negative.  Negative for chills, diaphoresis, fatigue and fever.  HENT: Negative.  Negative for sore throat and trouble swallowing.   Eyes: Negative.   Respiratory: Positive for cough. Negative for chest tightness, shortness of breath and wheezing.   Cardiovascular: Negative for chest pain, palpitations and leg  swelling.  Gastrointestinal: Negative for abdominal pain, constipation, diarrhea, nausea and vomiting.  Endocrine: Negative.   Genitourinary: Negative.  Negative for difficulty urinating and hematuria.  Musculoskeletal: Negative for arthralgias and myalgias.  Skin: Negative.  Negative for color change and pallor.  Neurological: Negative.  Negative for dizziness, weakness, light-headedness and headaches.  Hematological: Negative for adenopathy. Does not bruise/bleed easily.  Psychiatric/Behavioral: Negative.     Objective:  BP 114/70 (BP Location: Left Arm, Patient Position: Sitting, Cuff Size: Large)   Pulse (!) 103   Temp 98.1 F (36.7 C) (Oral)   Ht 5\' 5"  (1.651 m)   Wt 202 lb 4 oz (91.7 kg)   LMP 11/23/2018   SpO2 98%   BMI 33.66 kg/m   BP Readings from Last 3 Encounters:  11/27/18 114/70  09/17/18 114/67  09/05/18 130/86    Wt Readings from Last 3 Encounters:  11/27/18 202 lb 4 oz (91.7 kg)  09/17/18 197 lb (89.4 kg)  09/05/18 196 lb (88.9 kg)    Physical Exam Vitals signs reviewed.  Constitutional:      General: She is not in acute distress.    Appearance: Normal appearance. She is not ill-appearing, toxic-appearing or diaphoretic.  HENT:     Nose: Nose normal.     Mouth/Throat:     Mouth: Mucous membranes are moist.  Eyes:     General: No scleral icterus.    Conjunctiva/sclera: Conjunctivae normal.  Neck:  Musculoskeletal: Neck supple.  Cardiovascular:     Rate and Rhythm: Normal rate and regular rhythm.     Heart sounds: No murmur.  Pulmonary:     Effort: Pulmonary effort is normal.     Breath sounds: Normal breath sounds. No stridor. No decreased breath sounds, wheezing, rhonchi or rales.  Abdominal:     General: Abdomen is flat.     Palpations: Abdomen is soft. There is no mass.     Tenderness: There is no abdominal tenderness. There is no guarding.  Musculoskeletal: Normal range of motion.     Right lower leg: No edema.     Left lower leg: No  edema.  Lymphadenopathy:     Cervical: No cervical adenopathy.  Skin:    General: Skin is warm and dry.     Coloration: Skin is not pale.     Findings: No rash.  Neurological:     General: No focal deficit present.     Mental Status: She is alert.     Lab Results  Component Value Date   WBC 9.4 11/27/2018   HGB 15.3 (H) 11/27/2018   HCT 45.2 11/27/2018   PLT 448.0 (H) 11/27/2018   GLUCOSE 99 11/27/2018   CHOL 173 11/27/2018   TRIG 99.0 11/27/2018   HDL 56.30 11/27/2018   LDLCALC 97 11/27/2018   ALT 15 11/27/2018   AST 17 11/27/2018   NA 139 11/27/2018   K 3.4 (L) 11/27/2018   CL 103 11/27/2018   CREATININE 0.78 11/27/2018   BUN 6 11/27/2018   CO2 30 11/27/2018   TSH 0.49 01/18/2018   HGBA1C 5.1 09/27/2016    Dg Chest 2 View  Result Date: 09/06/2018 CLINICAL DATA:  Follow-up pneumonia EXAM: CHEST - 2 VIEW COMPARISON:  CTA chest dated 08/30/2018 FINDINGS: Multifocal patchy opacities in the bilateral lower lobes, similar to the prior. Subpleural/peripheral distribution. No pleural effusion or pneumothorax. The heart is normal in size. Visualized osseous structures are within normal limits. IMPRESSION: Multifocal pneumonia, grossly unchanged. As previously noted, viral etiologies remain possible. Electronically Signed   By: Julian Hy M.D.   On: 09/06/2018 02:59    Dg Chest 2 View  Result Date: 11/27/2018 CLINICAL DATA:  Persistent cough. Follow-up pneumonia. EXAM: CHEST - 2 VIEW COMPARISON:  09/05/2018 FINDINGS: The heart size and mediastinal contours are within normal limits. Both lungs are clear. Previously seen bilateral airspace disease has resolved since prior exam. No evidence of pleural effusion. The visualized skeletal structures are unremarkable. IMPRESSION: No active cardiopulmonary disease. Electronically Signed   By: Marlaine Hind M.D.   On: 11/27/2018 20:58    Assessment & Plan:   Arlow was seen today for annual exam.  Diagnoses and all orders for  this visit:  Routine general medical examination at a health care facility- Exam completed, labs reviewed, vaccines reviewed and updated, Pap and mammogram are up-to-date, patient education was given. -     Lipid panel; Future  Need for influenza vaccination -     Flu Vaccine QUAD 36+ mos IM  Vitamin B 12 deficiency -     cyanocobalamin ((VITAMIN B-12)) injection 1,000 mcg -     CBC with Differential/Platelet; Future  Pneumonia due to COVID-19 virus- Based on her symptoms, exam, and normal chest x-ray this has resolved. -     CBC with Differential/Platelet; Future -     Comprehensive metabolic panel; Future -     DG Chest 2 View; Future  Acute hypokalemia -  potassium chloride SA (KLOR-CON) 20 MEQ tablet; Take 1 tablet (20 mEq total) by mouth 2 (two) times daily.   I have discontinued Samuel [place]. [place]'s neomycin-polymyxin-hydrocortisone, promethazine-dextromethorphan, and cetirizine. I have also changed her potassium chloride SA. Additionally, I am having her maintain her esomeprazole, acetaminophen, terconazole, Alrex, and clotrimazole-betamethasone. We administered cyanocobalamin.  Meds ordered this encounter  Medications  . cyanocobalamin ((VITAMIN B-12)) injection 1,000 mcg  . potassium chloride SA (KLOR-CON) 20 MEQ tablet    Sig: Take 1 tablet (20 mEq total) by mouth 2 (two) times daily.    Dispense:  180 tablet    Refill:  1     Follow-up: Return if symptoms worsen or fail to improve.  Scarlette Calico, MD

## 2018-11-27 NOTE — Patient Instructions (Signed)
Health Maintenance, Female Adopting a healthy lifestyle and getting preventive care are important in promoting health and wellness. Ask your health care provider about:  The right schedule for you to have regular tests and exams.  Things you can do on your own to prevent diseases and keep yourself healthy. What should I know about diet, weight, and exercise? Eat a healthy diet   Eat a diet that includes plenty of vegetables, fruits, low-fat dairy products, and lean protein.  Do not eat a lot of foods that are high in solid fats, added sugars, or sodium. Maintain a healthy weight Body mass index (BMI) is used to identify weight problems. It estimates body fat based on height and weight. Your health care provider can help determine your BMI and help you achieve or maintain a healthy weight. Get regular exercise Get regular exercise. This is one of the most important things you can do for your health. Most adults should:  Exercise for at least 150 minutes each week. The exercise should increase your heart rate and make you sweat (moderate-intensity exercise).  Do strengthening exercises at least twice a week. This is in addition to the moderate-intensity exercise.  Spend less time sitting. Even light physical activity can be beneficial. Watch cholesterol and blood lipids Have your blood tested for lipids and cholesterol at 49 years of age, then have this test every 5 years. Have your cholesterol levels checked more often if:  Your lipid or cholesterol levels are high.  You are older than 49 years of age.  You are at high risk for heart disease. What should I know about cancer screening? Depending on your health history and family history, you may need to have cancer screening at various ages. This may include screening for:  Breast cancer.  Cervical cancer.  Colorectal cancer.  Skin cancer.  Lung cancer. What should I know about heart disease, diabetes, and high blood  pressure? Blood pressure and heart disease  High blood pressure causes heart disease and increases the risk of stroke. This is more likely to develop in people who have high blood pressure readings, are of African descent, or are overweight.  Have your blood pressure checked: ? Every 3-5 years if you are 18-39 years of age. ? Every year if you are 40 years old or older. Diabetes Have regular diabetes screenings. This checks your fasting blood sugar level. Have the screening done:  Once every three years after age 40 if you are at a normal weight and have a low risk for diabetes.  More often and at a younger age if you are overweight or have a high risk for diabetes. What should I know about preventing infection? Hepatitis B If you have a higher risk for hepatitis B, you should be screened for this virus. Talk with your health care provider to find out if you are at risk for hepatitis B infection. Hepatitis C Testing is recommended for:  Everyone born from 1945 through 1965.  Anyone with known risk factors for hepatitis C. Sexually transmitted infections (STIs)  Get screened for STIs, including gonorrhea and chlamydia, if: ? You are sexually active and are younger than 49 years of age. ? You are older than 49 years of age and your health care provider tells you that you are at risk for this type of infection. ? Your sexual activity has changed since you were last screened, and you are at increased risk for chlamydia or gonorrhea. Ask your health care provider if   you are at risk.  Ask your health care provider about whether you are at high risk for HIV. Your health care provider may recommend a prescription medicine to help prevent HIV infection. If you choose to take medicine to prevent HIV, you should first get tested for HIV. You should then be tested every 3 months for as long as you are taking the medicine. Pregnancy  If you are about to stop having your period (premenopausal) and  you may become pregnant, seek counseling before you get pregnant.  Take 400 to 800 micrograms (mcg) of folic acid every day if you become pregnant.  Ask for birth control (contraception) if you want to prevent pregnancy. Osteoporosis and menopause Osteoporosis is a disease in which the bones lose minerals and strength with aging. This can result in bone fractures. If you are 65 years old or older, or if you are at risk for osteoporosis and fractures, ask your health care provider if you should:  Be screened for bone loss.  Take a calcium or vitamin D supplement to lower your risk of fractures.  Be given hormone replacement therapy (HRT) to treat symptoms of menopause. Follow these instructions at home: Lifestyle  Do not use any products that contain nicotine or tobacco, such as cigarettes, e-cigarettes, and chewing tobacco. If you need help quitting, ask your health care provider.  Do not use street drugs.  Do not share needles.  Ask your health care provider for help if you need support or information about quitting drugs. Alcohol use  Do not drink alcohol if: ? Your health care provider tells you not to drink. ? You are pregnant, may be pregnant, or are planning to become pregnant.  If you drink alcohol: ? Limit how much you use to 0-1 drink a day. ? Limit intake if you are breastfeeding.  Be aware of how much alcohol is in your drink. In the [place] one drink equals one 12 oz bottle of beer (355 mL), one 5 oz glass of wine (148 mL), or one 1 oz glass of hard liquor (44 mL). General instructions  Schedule regular health, dental, and eye exams.  Stay current with your vaccines.  Tell your health care provider if: ? You often feel depressed. ? You have ever been abused or do not feel safe at home. Summary  Adopting a healthy lifestyle and getting preventive care are important in promoting health and wellness.  Follow your health care provider's instructions about healthy  diet, exercising, and getting tested or screened for diseases.  Follow your health care provider's instructions on monitoring your cholesterol and blood pressure. This information is not intended to replace advice given to you by your health care provider. Make sure you discuss any questions you have with your health care provider. Document Released: 07/26/2010 Document Revised: 01/03/2018 Document Reviewed: 01/03/2018 Elsevier Patient Education  2020 Elsevier Inc.  

## 2018-11-28 ENCOUNTER — Encounter: Payer: Self-pay | Admitting: Internal Medicine

## 2018-12-27 ENCOUNTER — Ambulatory Visit (INDEPENDENT_AMBULATORY_CARE_PROVIDER_SITE_OTHER): Payer: 59

## 2018-12-27 ENCOUNTER — Other Ambulatory Visit: Payer: Self-pay

## 2018-12-27 DIAGNOSIS — E538 Deficiency of other specified B group vitamins: Secondary | ICD-10-CM | POA: Diagnosis not present

## 2018-12-27 MED ORDER — CYANOCOBALAMIN 1000 MCG/ML IJ SOLN
1000.0000 ug | Freq: Once | INTRAMUSCULAR | Status: AC
  Administered 2018-12-27: 1000 ug via INTRAMUSCULAR

## 2018-12-27 NOTE — Progress Notes (Signed)
I have reviewed and agree.

## 2019-01-15 ENCOUNTER — Other Ambulatory Visit: Payer: Self-pay | Admitting: *Deleted

## 2019-01-15 DIAGNOSIS — E538 Deficiency of other specified B group vitamins: Secondary | ICD-10-CM

## 2019-01-15 DIAGNOSIS — D473 Essential (hemorrhagic) thrombocythemia: Secondary | ICD-10-CM

## 2019-01-15 DIAGNOSIS — D75839 Thrombocytosis, unspecified: Secondary | ICD-10-CM

## 2019-01-16 ENCOUNTER — Encounter: Payer: Self-pay | Admitting: Hematology

## 2019-01-16 ENCOUNTER — Inpatient Hospital Stay: Payer: 59 | Attending: Hematology

## 2019-01-16 ENCOUNTER — Telehealth: Payer: Self-pay | Admitting: Hematology

## 2019-01-16 ENCOUNTER — Other Ambulatory Visit: Payer: Self-pay

## 2019-01-16 ENCOUNTER — Inpatient Hospital Stay (HOSPITAL_BASED_OUTPATIENT_CLINIC_OR_DEPARTMENT_OTHER): Payer: 59 | Admitting: Hematology

## 2019-01-16 VITALS — BP 110/66 | HR 81 | Temp 98.2°F | Resp 18 | Ht 65.0 in

## 2019-01-16 DIAGNOSIS — D473 Essential (hemorrhagic) thrombocythemia: Secondary | ICD-10-CM | POA: Insufficient documentation

## 2019-01-16 DIAGNOSIS — D75839 Thrombocytosis, unspecified: Secondary | ICD-10-CM

## 2019-01-16 DIAGNOSIS — E538 Deficiency of other specified B group vitamins: Secondary | ICD-10-CM | POA: Insufficient documentation

## 2019-01-16 LAB — CMP (CANCER CENTER ONLY)
ALT: 17 U/L (ref 0–44)
AST: 21 U/L (ref 15–41)
Albumin: 3 g/dL — ABNORMAL LOW (ref 3.5–5.0)
Alkaline Phosphatase: 69 U/L (ref 38–126)
Anion gap: 7 (ref 5–15)
BUN: 5 mg/dL — ABNORMAL LOW (ref 6–20)
CO2: 28 mmol/L (ref 22–32)
Calcium: 8.3 mg/dL — ABNORMAL LOW (ref 8.9–10.3)
Chloride: 105 mmol/L (ref 98–111)
Creatinine: 0.79 mg/dL (ref 0.44–1.00)
GFR, Est AFR Am: >60 mL/min (ref >60)
GFR, Estimated: >60 mL/min (ref >60)
Glucose, Bld: 98 mg/dL (ref 70–99)
Potassium: 4.2 mmol/L (ref 3.5–5.1)
Sodium: 140 mmol/L (ref 135–145)
Total Bilirubin: 1 mg/dL (ref 0.3–1.2)
Total Protein: 5.6 g/dL — ABNORMAL LOW (ref 6.5–8.1)

## 2019-01-16 LAB — CBC WITH DIFFERENTIAL (CANCER CENTER ONLY)
Abs Immature Granulocytes: 0.02 10*3/uL (ref 0.00–0.07)
Basophils Absolute: 0 10*3/uL (ref 0.0–0.1)
Basophils Relative: 1 %
Eosinophils Absolute: 0.2 10*3/uL (ref 0.0–0.5)
Eosinophils Relative: 3 %
HCT: 43.5 % (ref 36.0–46.0)
Hemoglobin: 14.5 g/dL (ref 12.0–15.0)
Immature Granulocytes: 0 %
Lymphocytes Relative: 35 %
Lymphs Abs: 2.3 10*3/uL (ref 0.7–4.0)
MCH: 30.7 pg (ref 26.0–34.0)
MCHC: 33.3 g/dL (ref 30.0–36.0)
MCV: 92.2 fL (ref 80.0–100.0)
Monocytes Absolute: 0.4 10*3/uL (ref 0.1–1.0)
Monocytes Relative: 6 %
Neutro Abs: 3.6 10*3/uL (ref 1.7–7.7)
Neutrophils Relative %: 55 %
Platelet Count: 356 10*3/uL (ref 150–400)
RBC: 4.72 MIL/uL (ref 3.87–5.11)
RDW: 13.2 % (ref 11.5–15.5)
WBC Count: 6.5 10*3/uL (ref 4.0–10.5)
nRBC: 0 % (ref 0.0–0.2)

## 2019-01-16 LAB — VITAMIN B12: Vitamin B-12: 1389 pg/mL — ABNORMAL HIGH (ref 180–914)

## 2019-01-16 NOTE — Progress Notes (Signed)
HEMATOLOGY/ONCOLOGY CLINIC NOTE  Date of Service: 01/16/2019  Patient Care Team: Janith Lima, MD as PCP - General (Internal Medicine)  CHIEF COMPLAINTS/PURPOSE OF CONSULTATION:  Vitamin B12 deficiency  thrombocytosis  HISTORY OF PRESENTING ILLNESS:   Rhonda [name] is a wonderful 48 y.o. female who has been referred to [place] by Dr. Walden Field for evaluation and management of Vitamin B12 deficiency and transient thrombocytosis. The pt reports that she  is doing well overall.   The pt reports an earache was what prompted her to go to the hospital. She reports she is still fatigued and experiences SOB when she exerts herself. Her SOB is uncharacteristic. She is almost finished with her first round of antibiotics and steroids and is about to begin another. When she completes her second round she will have another Chest XR ordered by her PCP. She believes that she picked up COVID-19 from work. Pt has been getting monthly Vitamin B12 shots since 2010. She feels that she can tell when her Vitamin B12 is low as she feels fatigued and sleeps a lot.   Of note prior to the patient's visit today, pt has had Chest XR completed on 09/05/2018 with results revealing "Multifocal pneumonia, grossly unchanged. As previously noted, viral etiologies remain possible."   Most recent lab results (09/06/2018) of CBC with CMP is as follows: all values are WNL except for Platelets at 523.0K, Potassium at 3.3, Glucose at 113, ALT at 40.  On review of systems, pt reports SOB, fatigue, night sweats and denies runny nose, fever, chills, back pain, abdominal pain and any other symptoms.   On Social Hx the pt reports she has never smoked.  On Family Hx the pt reports an uncle Dx with colon cancer >50 y/o.   INTERVAL HISTORY:  Rhonda [name] is a 48 y.o. female here for evaluation and management of B12 deficiency and Thrombocytosis . The patient's last visit with [place] was on 09/17/2018. The pt reports that she is doing  well overall.  The pt reports Dr. Ronnald Ramp started her on B12 shots.   Lab results today 01/16/19 of CBC w/diff and CMP is as follows: all values are WNL except for BUN at 5, Calcium at 8.3, Total Protein at 5.6, Albumin at 3.0   Anti parietal antibody 09/17/2018     On review of systems, pt denies changes in energy levels, weight loss,leg swelling  and any other symptoms.    MEDICAL HISTORY:  Past Medical History:  Diagnosis Date  . Allergic rhinitis   . Allergy   . B12 deficiency   . Carpal tunnel syndrome   . Dysfunction of eustachian tube     SURGICAL HISTORY: Past Surgical History:  Procedure Laterality Date  . BUNIONECTOMY     right   . CHOLECYSTECTOMY N/A 09/25/2015   Procedure: LAPAROSCOPIC CHOLECYSTECTOMY WITH INTRAOPERATIVE CHOLANGIOGRAM;  Surgeon: Alphonsa Overall, MD;  Location: [facility];  Service: General;  Laterality: N/A;  . TONSILLECTOMY      SOCIAL HISTORY: Social History   Socioeconomic History  . Marital status: Single    Spouse name: Not on file  . Number of children: 0  . Years of education: Assoc  . Highest education level: Not on file  Occupational History  . Occupation: Personal assistant  Tobacco Use  . Smoking status: Never Smoker  . Smokeless tobacco: Never Used  Substance and Sexual Activity  . Alcohol use: No  . Drug use: No  . Sexual activity: Not on file  Other Topics Concern  . Not on file  Social History Narrative   College grad   Single   Bought her own home in '09   Occupation: Programme researcher, broadcasting/film/video and cleaning   Fun: Resting, yard work   Denies religious beliefs effecting health care.    Drinks caffeine daily    Social Determinants of Health   Financial Resource Strain:   . Difficulty of Paying Living Expenses: Not on file  Food Insecurity:   . Worried About Charity fundraiser in the Last Year: Not on file  . Ran Out of Food in the Last Year: Not on file  Transportation Needs:   . Lack of Transportation (Medical): Not on file  . Lack  of Transportation (Non-Medical): Not on file  Physical Activity:   . Days of Exercise per Week: Not on file  . Minutes of Exercise per Session: Not on file  Stress:   . Feeling of Stress : Not on file  Social Connections:   . Frequency of Communication with Friends and Family: Not on file  . Frequency of Social Gatherings with Friends and Family: Not on file  . Attends Religious Services: Not on file  . Active Member of Clubs or Organizations: Not on file  . Attends Archivist Meetings: Not on file  . Marital Status: Not on file  Intimate Partner Violence:   . Fear of Current or Ex-Partner: Not on file  . Emotionally Abused: Not on file  . Physically Abused: Not on file  . Sexually Abused: Not on file    FAMILY HISTORY: Family History  Problem Relation Age of Onset  . Heart disease Father   . Hypertension Father   . Diabetes Father   . Hyperlipidemia Mother   . Hypertension Mother   . Heart disease Maternal Grandmother   . Headache Neg Hx     ALLERGIES:  is allergic to bactrim ds [sulfamethoxazole-trimethoprim]; cephalosporins; oseltamivir phosphate; amoxicillin; and doxycycline.  MEDICATIONS:  Current Outpatient Medications  Medication Sig Dispense Refill  . acetaminophen (TYLENOL) 325 MG tablet Take 325 mg by mouth every 4 (four) hours as needed for fever.    . clotrimazole-betamethasone (LOTRISONE) cream APP EXT AA BID PRN    . esomeprazole (NEXIUM) 40 MG capsule Take 1 capsule (40 mg total) by mouth daily at 12 noon. 90 capsule 0  . loteprednol (ALREX) 0.2 % SUSP Alrex 0.2 % eye drops,suspension    . potassium chloride SA (KLOR-CON) 20 MEQ tablet Take 1 tablet (20 mEq total) by mouth 2 (two) times daily. 180 tablet 1  . terconazole (TERAZOL 3) 0.8 % vaginal cream terconazole 0.8 % vaginal cream  INSERT 1 APPLICATORFUL VAGINALLY QD FOR 7 DAYS UTD     No current facility-administered medications for this visit.    REVIEW OF SYSTEMS:   A 10+ POINT REVIEW OF  SYSTEMS WAS OBTAINED including neurology, dermatology, psychiatry, cardiac, respiratory, lymph, extremities, GI, GU, Musculoskeletal, constitutional, breasts, reproductive, HEENT.  All pertinent positives are noted in the HPI.  All others are negative.     PHYSICAL EXAMINATION: ECOG FS:0 - Asymptomatic  Vitals:   01/16/19 1041  BP: 110/66  Pulse: 81  Resp: 18  Temp: 98.2 F (36.8 C)  SpO2: 100%   Wt Readings from Last 3 Encounters:  11/27/18 202 lb 4 oz (91.7 kg)  09/17/18 197 lb (89.4 kg)  09/05/18 196 lb (88.9 kg)   Body mass index is 33.66 kg/m.    GENERAL:alert, in no  acute distress and comfortable SKIN: no acute rashes, no significant lesions EYES: conjunctiva are pink and non-injected, sclera anicteric OROPHARYNX: MMM, no exudates, no oropharyngeal erythema or ulceration NECK: supple, no JVD LYMPH:  no palpable lymphadenopathy in the cervical, axillary or inguinal regions LUNGS: clear to auscultation b/l with normal respiratory effort HEART: regular rate & rhythm ABDOMEN:  normoactive bowel sounds , non tender, not distended. Extremity: no pedal edema PSYCH: alert & oriented x 3 with fluent speech NEURO: no focal motor/sensory deficits   LABORATORY DATA:  I have reviewed the data as listed  Anti-parietal antibody 09/17/2018  Molecular Pathology 09/17/2018  CBC Latest Ref Rng & Units 01/16/2019 11/27/2018 09/17/2018  WBC 4.0 - 10.5 K/uL 6.5 9.4 12.9(H)  Hemoglobin 12.0 - 15.0 g/dL 14.5 15.3(H) 13.8  Hematocrit 36.0 - 46.0 % 43.5 45.2 40.7  Platelets 150 - 400 K/uL 356 448.0(H) 355    . CMP Latest Ref Rng & Units 01/16/2019 11/27/2018 09/06/2018  Glucose 70 - 99 mg/dL 98 99 113(H)  BUN 6 - 20 mg/dL 5(L) 6 6  Creatinine 0.44 - 1.00 mg/dL 0.79 0.78 0.79  Sodium 135 - 145 mmol/L 140 139 136  Potassium 3.5 - 5.1 mmol/L 4.2 3.4(L) 3.3(L)  Chloride 98 - 111 mmol/L 105 103 99  CO2 22 - 32 mmol/L 28 30 27   Calcium 8.9 - 10.3 mg/dL 8.3(L) 8.3(L) 9.2  Total Protein  6.5 - 8.1 g/dL 5.6(L) 5.5(L) 7.6  Total Bilirubin 0.3 - 1.2 mg/dL 1.0 0.6 1.1  Alkaline Phos 38 - 126 U/L 69 68 88  AST 15 - 41 U/L 21 17 24   ALT 0 - 44 U/L 17 15 40(H)     RADIOGRAPHIC STUDIES: I have personally reviewed the radiological images as listed and agreed with the findings in the report. No results found.  ASSESSMENT & PLAN:   1) B12 deficiency- r/o pernicious anemia ? etiology 2) Thrombocytosis - no evidence of clonal thrombocytosis  PLAN -Discussed pt labwork today, 01/16/19 ; all values are WNL except for BUN at 5, Calcium at 8.3, Total Protein at 5.6, Albumin at 3.0 -01/16/19 platelets at 356 -Advised pt to continue to follow up with her PCP for platelet concerns  -Discussed that 09/17/2018 JAK2 was normal  -Discussed that 09/17/2018 anti parietal anitbody was negative.  Continue B12 replacement with PCP  FOLLOW UP: -continue followup with primary care physician. No need for further hematologic followup.  The total time spent in the appt was 15 minutes and more than 50% was on counseling and direct patient cares.  All of the patient's questions were answered with apparent satisfaction. The patient knows to call the clinic with any problems, questions or concerns.    Sullivan Lone MD MS AAHIVMS [facility] Hematology/Oncology Physician [facility]  (Office):       [phone] (Work cell):  [phone] (Fax):           [phone]  01/16/2019 4:53 PM  I, Scot Dock, am acting as a scribe for Dr. Sullivan Lone.   .I have reviewed the above documentation for accuracy and completeness, and I agree with the above. Brunetta Genera MD

## 2019-01-16 NOTE — Telephone Encounter (Signed)
Per 12/23 los continue followup with primary care physician. No need for further hematologic followup.

## 2019-01-28 ENCOUNTER — Ambulatory Visit (INDEPENDENT_AMBULATORY_CARE_PROVIDER_SITE_OTHER): Payer: 59

## 2019-01-28 ENCOUNTER — Other Ambulatory Visit: Payer: Self-pay

## 2019-01-28 DIAGNOSIS — E538 Deficiency of other specified B group vitamins: Secondary | ICD-10-CM | POA: Diagnosis not present

## 2019-01-28 MED ORDER — CYANOCOBALAMIN 1000 MCG/ML IJ SOLN
1000.0000 ug | Freq: Once | INTRAMUSCULAR | Status: AC
  Administered 2019-01-28: 1000 ug via INTRAMUSCULAR

## 2019-01-28 NOTE — Progress Notes (Signed)
I have reviewed and agree.

## 2019-02-28 ENCOUNTER — Other Ambulatory Visit: Payer: Self-pay

## 2019-02-28 ENCOUNTER — Encounter (HOSPITAL_COMMUNITY): Payer: Self-pay | Admitting: Emergency Medicine

## 2019-02-28 ENCOUNTER — Emergency Department (HOSPITAL_COMMUNITY)
Admission: EM | Admit: 2019-02-28 | Discharge: 2019-02-28 | Disposition: A | Discharge status: Home / Self Care | Payer: 59 | Attending: Emergency Medicine | Admitting: Emergency Medicine

## 2019-02-28 DIAGNOSIS — Z79899 Other long term (current) drug therapy: Secondary | ICD-10-CM | POA: Diagnosis not present

## 2019-02-28 DIAGNOSIS — R1310 Dysphagia, unspecified: Secondary | ICD-10-CM | POA: Insufficient documentation

## 2019-02-28 DIAGNOSIS — R220 Localized swelling, mass and lump, head: Secondary | ICD-10-CM

## 2019-02-28 LAB — COMPREHENSIVE METABOLIC PANEL
ALT: 18 U/L (ref 0–44)
AST: 21 U/L (ref 15–41)
Albumin: 3.6 g/dL (ref 3.5–5.0)
Alkaline Phosphatase: 81 U/L (ref 38–126)
Anion gap: 9 (ref 5–15)
BUN: 12 mg/dL (ref 6–20)
CO2: 28 mmol/L (ref 22–32)
Calcium: 8.8 mg/dL — ABNORMAL LOW (ref 8.9–10.3)
Chloride: 104 mmol/L (ref 98–111)
Creatinine, Ser: 0.82 mg/dL (ref 0.44–1.00)
GFR calc Af Amer: >60 mL/min (ref >60)
GFR calc non Af Amer: >60 mL/min (ref >60)
Glucose, Bld: 107 mg/dL — ABNORMAL HIGH (ref 70–99)
Potassium: 3.8 mmol/L (ref 3.5–5.1)
Sodium: 141 mmol/L (ref 135–145)
Total Bilirubin: 0.9 mg/dL (ref 0.3–1.2)
Total Protein: 7 g/dL (ref 6.5–8.1)

## 2019-02-28 LAB — CBC
HCT: 43.2 % (ref 36.0–46.0)
Hemoglobin: 14.4 g/dL (ref 12.0–15.0)
MCH: 31.4 pg (ref 26.0–34.0)
MCHC: 33.3 g/dL (ref 30.0–36.0)
MCV: 94.1 fL (ref 80.0–100.0)
Platelets: 395 10*3/uL (ref 150–400)
RBC: 4.59 MIL/uL (ref 3.87–5.11)
RDW: 13 % (ref 11.5–15.5)
WBC: 7.5 10*3/uL (ref 4.0–10.5)
nRBC: 0 % (ref 0.0–0.2)

## 2019-02-28 MED ORDER — DIPHENHYDRAMINE HCL 50 MG PO TABS: 50.0000 mg | ORAL_TABLET | Freq: Every evening | ORAL | 0 refills | Status: DC | PRN

## 2019-02-28 MED ORDER — FAMOTIDINE 20 MG PO TABS
20.0000 mg | ORAL_TABLET | Freq: Once | ORAL | Status: AC
  Administered 2019-02-28: 20 mg via ORAL
  Filled 2019-02-28: qty 1

## 2019-02-28 NOTE — ED Provider Notes (Signed)
[facility] DEPT Provider Note   CSN: [id number] Arrival date & time: 02/28/19  [id number]     History Chief Complaint  Patient presents with  . Oral Swelling    Rhonda [name] is a 49 y.o. female.  Rhonda [name] is a 49 y/o female, with a PMH of allergies, allergic rhinitis, and Vitamin B12 deficiency, who presents to [facility] ED with chief complaint of a swollen tongue. Patient state that her symptoms started last evening during dinner. She state that while she was eating her spaghetti and Ragu sauce. While eating her dinner, she stated that her tongue felt like it was beginning to swell. She notes that the L side of her tongue felt more swollen the the R side.She states that it was mildly difficult to swallow food, but did not feel SHOB during her meal. She states that her swelling alleviated overnight without any medical interventions. On the morning of the 02/28/2019, she states that her tongue feels "kind of like a scab," but her symptoms have resolved. Her dinner last night used all ingredients she typically utilizes, she denies biting her tongue, she denies burning her tongue on her meal. She denies fevers, SHOB, headaches, chest pain, nausea, vomiting, diarrhea, constipation.       Past Medical History:  Diagnosis Date  . Allergic rhinitis   . Allergy   . B12 deficiency   . Carpal tunnel syndrome   . Dysfunction of eustachian tube     Patient Active Problem List   Diagnosis Date Noted  . Chiari malformation type II ([place]) 09/27/2016  . Abnormal MRI of head 03/07/2016  . Gallstones and inflammation of gallbladder without obstruction 08/28/2015  . Gastroesophageal reflux disease without esophagitis 08/19/2015  . IBS (irritable bowel syndrome) 08/19/2015  . Routine general medical examination at a health care facility 07/14/2014  . Vitamin B 12 deficiency 12/08/2008  . CARPAL TUNNEL SYNDROME 05/14/2007  . DE QUERVAIN'S TENOSYNOVITIS 05/14/2007  .  Allergic rhinitis 01/31/2007    Past Surgical History:  Procedure Laterality Date  . BUNIONECTOMY     right   . CHOLECYSTECTOMY N/A 09/25/2015   Procedure: LAPAROSCOPIC CHOLECYSTECTOMY WITH INTRAOPERATIVE CHOLANGIOGRAM;  Surgeon: Alphonsa Overall, MD;  Location: [facility] ORS;  Service: General;  Laterality: N/A;  . TONSILLECTOMY       OB History   No obstetric history on file.     Family History  Problem Relation Age of Onset  . Heart disease Father   . Hypertension Father   . Diabetes Father   . Hyperlipidemia Mother   . Hypertension Mother   . Heart disease Maternal Grandmother   . Headache Neg Hx     Social History   Tobacco Use  . Smoking status: Never Smoker  . Smokeless tobacco: Never Used  Substance Use Topics  . Alcohol use: No  . Drug use: No    Home Medications Prior to Admission medications   Medication Sig Start Date End Date Taking? Authorizing Provider  acetaminophen (TYLENOL) 325 MG tablet Take 325 mg by mouth every 4 (four) hours as needed for fever.    [provider]  clotrimazole-betamethasone (LOTRISONE) cream APP EXT AA BID PRN 10/30/18   [provider]  esomeprazole (NEXIUM) 40 MG capsule Take 1 capsule (40 mg total) by mouth daily at 12 noon. 04/27/18 08/30/18  Marrian Salvage, FNP  loteprednol (ALREX) 0.2 % SUSP Alrex 0.2 % eye drops,suspension    [provider]  potassium chloride SA (KLOR-CON)  20 MEQ tablet Take 1 tablet (20 mEq total) by mouth 2 (two) times daily. 11/27/18   Janith Lima, MD  terconazole (TERAZOL 3) 0.8 % vaginal cream terconazole 0.8 % vaginal cream  INSERT 1 APPLICATORFUL VAGINALLY QD FOR 7 DAYS UTD    [provider]    Allergies    Bactrim ds [sulfamethoxazole-trimethoprim], Cephalosporins, Oseltamivir phosphate, Amoxicillin, and Doxycycline  Review of Systems   Review of Systems  Constitutional: Negative for chills, diaphoresis, fatigue and fever.  HENT: Positive for trouble  swallowing. Negative for drooling, facial swelling, mouth sores, rhinorrhea, sinus pain and sore throat.   Respiratory: Negative for cough, choking, chest tightness, shortness of breath and wheezing.   Cardiovascular: Negative for chest pain.  Gastrointestinal: Negative for abdominal distention, abdominal pain, constipation, diarrhea, nausea and vomiting.  Genitourinary: Negative for dysuria and enuresis.  Musculoskeletal: Negative for arthralgias.  Allergic/Immunologic: Positive for environmental allergies. Negative for immunocompromised state.       Patient reports multiple environmental allergies, but is unsure what they are.   Neurological: Negative for facial asymmetry, light-headedness, numbness and headaches.    Physical Exam Updated Vital Signs BP (!) 132/96   Pulse 77   Temp 98.9 F (37.2 C) (Oral)   Resp 16   LMP 02/28/2019   SpO2 98%   Physical Exam Vitals and nursing note reviewed.  Constitutional:      General: She is not in acute distress.    Appearance: Normal appearance. She is not ill-appearing, toxic-appearing or diaphoretic.  HENT:     Head: Normocephalic and atraumatic.     Comments: No submandibular, submental, cervical, parotid lymphadenopathy appreciated.     Nose: Nose normal.     Mouth/Throat:     Mouth: Mucous membranes are moist.     Pharynx: No oropharyngeal exudate or posterior oropharyngeal erythema.     Comments: Poor dentition with dental carries, sublingual gland swelling appreciated bilaterally, no erythema, tongue swelling, or leukoplakia. Cardiovascular:     Rate and Rhythm: Normal rate and regular rhythm.     Pulses: Normal pulses.     Heart sounds: Normal heart sounds. No murmur. No friction rub. No gallop.   Pulmonary:     Effort: Pulmonary effort is normal.     Breath sounds: Normal breath sounds. No wheezing, rhonchi or rales.  Abdominal:     General: Abdomen is flat. Bowel sounds are normal.     Palpations: Abdomen is soft.      Tenderness: There is no abdominal tenderness. There is no guarding.  Musculoskeletal:        General: No swelling or tenderness.     Right lower leg: No edema.     Left lower leg: No edema.  Skin:    General: Skin is warm.     Findings: No bruising or lesion.  Neurological:     General: No focal deficit present.     Mental Status: She is alert and oriented to person, place, and time.     ED Results / Procedures / Treatments   Labs (all labs ordered are listed, but only abnormal results are displayed) Labs Reviewed - No data to display  EKG None  Radiology No results found.  Procedures Procedures (including critical care time)  Medications Ordered in ED Medications - No data to display  ED Course  I have reviewed the triage vital signs and the nursing notes.  Pertinent labs & imaging results that were available during my care of the patient were  reviewed by me and considered in my medical decision making (see chart for details).    MDM Rules/Calculators/A&P                      Final Clinical Impression(s) / ED Diagnoses Final diagnoses:  None  Rhonda [name] is a 49 y/o female, with a PMH of allergies, B12 deficiency, and carpal tunnel syndrome, who presented to [facility] with a swollen tongue. Patient was evaluated and labs were analyzed, and she was found to be safe for discharge home. Patient ordered Pepcid 20 mg during her stay and discharged home with benadryl 50 mg. Patient instructed to come back if her tongue begins to swell, if she has difficulty breathing, or has acute decline in her mental status or quality of health.   Rx / DC Orders ED Discharge Orders    None       Maudie Mercury, MD 02/28/19 1319    Carmin Muskrat, MD 02/28/19 [phone]

## 2019-02-28 NOTE — ED Triage Notes (Signed)
Pt reports that yesterday her left side of her tongue was swollen, reports got better. States tongue feels rough. Denies taking anything.

## 2019-02-28 NOTE — Discharge Instructions (Addendum)
To Ms. Rhonda [name],  Thank you for choosing [facility] for your medical care management. You were evaluated for your tongue swelling. During your physical examination, we did not observe any lacerations or signs of infections. Upon review of your labs and vitals, did not show signs of infection. You will be discharged with benadryl. Please call your primary care physician to schedule an appointment for follow up on your results. Please take your medication as indicated on the label when you arrive home. If you feel shortness of breath, wheezing, swelling that makes it difficult to breath, please come back to the emergency department for reevaluation.

## 2019-03-01 LAB — C4 COMPLEMENT: Complement C4, Body Fluid: 21 mg/dL (ref 12–38)

## 2019-03-13 ENCOUNTER — Ambulatory Visit (INDEPENDENT_AMBULATORY_CARE_PROVIDER_SITE_OTHER): Payer: 59 | Admitting: *Deleted

## 2019-03-13 ENCOUNTER — Other Ambulatory Visit: Payer: Self-pay

## 2019-03-13 DIAGNOSIS — E538 Deficiency of other specified B group vitamins: Secondary | ICD-10-CM

## 2019-03-13 MED ORDER — CYANOCOBALAMIN 1000 MCG/ML IJ SOLN
1000.0000 ug | Freq: Once | INTRAMUSCULAR | Status: AC
  Administered 2019-03-13: 09:00:00 1000 ug via INTRAMUSCULAR

## 2019-03-13 NOTE — Progress Notes (Signed)
I have reviewed and agree.

## 2019-04-10 ENCOUNTER — Ambulatory Visit (INDEPENDENT_AMBULATORY_CARE_PROVIDER_SITE_OTHER): Payer: 59 | Admitting: *Deleted

## 2019-04-10 ENCOUNTER — Other Ambulatory Visit: Payer: Self-pay

## 2019-04-10 DIAGNOSIS — E538 Deficiency of other specified B group vitamins: Secondary | ICD-10-CM

## 2019-04-10 MED ORDER — CYANOCOBALAMIN 1000 MCG/ML IJ SOLN
1000.0000 ug | Freq: Once | INTRAMUSCULAR | Status: AC
  Administered 2019-04-10: 1000 ug via INTRAMUSCULAR

## 2019-04-10 NOTE — Progress Notes (Addendum)
I have reviewed and agree.

## 2019-04-15 ENCOUNTER — Other Ambulatory Visit: Payer: Self-pay

## 2019-04-15 ENCOUNTER — Encounter: Payer: Self-pay | Admitting: Internal Medicine

## 2019-04-15 ENCOUNTER — Ambulatory Visit: Payer: 59 | Admitting: Internal Medicine

## 2019-04-15 VITALS — BP 124/80 | HR 93 | Temp 98.4°F | Resp 16 | Ht 65.0 in | Wt 208.4 lb

## 2019-04-15 DIAGNOSIS — R21 Rash and other nonspecific skin eruption: Secondary | ICD-10-CM

## 2019-04-15 MED ORDER — PREDNISONE 10 MG PO TABS: ORAL_TABLET | ORAL | 0 refills | Status: DC

## 2019-04-15 MED ORDER — METHYLPREDNISOLONE ACETATE 80 MG/ML IJ SUSP
80.0000 mg | Freq: Once | INTRAMUSCULAR | Status: AC
  Administered 2019-04-15: 80 mg via INTRAMUSCULAR

## 2019-04-15 NOTE — Assessment & Plan Note (Signed)
Acute Allergic in nature ?  Related to Covid vaccine-it would be a delayed reaction, Herring, or something else Benadryl and hydrocortisone cream had no effect on improving her symptoms Depo-Medrol 80 mg IM x1 Prednisone taper starting tomorrow Can take nondrowsy antihistamine or Benadryl as needed Call if no improvement

## 2019-04-15 NOTE — Progress Notes (Signed)
Subjective:    Patient ID: Rhonda [name], female    DOB: 1970/06/14, 49 y.o.   MRN: [id number]  HPI The patient is here for an acute visit.  She had her first Covid vaccine on 3/13.  The next day she was tired and her neck hurt.  She did not feel well for about 1 week afterwards.  Sunday she did not go to work because she was not feeling well.  7 days after the vaccine she took an allergy medication, some cough syrup, ate a can of Herring which is not unusual for her and developed itching and a rash.  The rash was concentrated on her upper legs, but has spread to her arms.  The rash is very itchy.  Her legs feel warm and even swollen.  There is heat radiating from the rash.  She has a lump in her right groin that is tender.  She is unsure if this was from the herring, but she eats this frequently, the Covid vaccine or something else.  She has had allergic reactions and knows she has allergies.  She tried taking Benadryl and applying hydrocortisone cream, but neither helped.      Medications and allergies reviewed with patient and updated if appropriate.  Patient Active Problem List   Diagnosis Date Noted  . Rash and nonspecific skin eruption 04/15/2019  . Chiari malformation type II ([place]) 09/27/2016  . Abnormal MRI of head 03/07/2016  . Gallstones and inflammation of gallbladder without obstruction 08/28/2015  . Gastroesophageal reflux disease without esophagitis 08/19/2015  . IBS (irritable bowel syndrome) 08/19/2015  . Routine general medical examination at a health care facility 07/14/2014  . Vitamin B 12 deficiency 12/08/2008  . CARPAL TUNNEL SYNDROME 05/14/2007  . DE QUERVAIN'S TENOSYNOVITIS 05/14/2007  . Allergic rhinitis 01/31/2007    Current Outpatient Medications on File Prior to Visit  Medication Sig Dispense Refill  . acetaminophen (TYLENOL) 325 MG tablet Take 325 mg by mouth every 4 (four) hours as needed for fever.    . clotrimazole-betamethasone (LOTRISONE)  cream APP EXT AA BID PRN    . diphenhydrAMINE (BENADRYL) 50 MG tablet Take 1 tablet (50 mg total) by mouth at bedtime as needed for itching or allergies. 30 tablet 0  . loteprednol (ALREX) 0.2 % SUSP Alrex 0.2 % eye drops,suspension    . potassium chloride SA (KLOR-CON) 20 MEQ tablet Take 1 tablet (20 mEq total) by mouth 2 (two) times daily. 180 tablet 1  . terconazole (TERAZOL 3) 0.8 % vaginal cream terconazole 0.8 % vaginal cream  INSERT 1 APPLICATORFUL VAGINALLY QD FOR 7 DAYS UTD    . esomeprazole (NEXIUM) 40 MG capsule Take 1 capsule (40 mg total) by mouth daily at 12 noon. 90 capsule 0   No current facility-administered medications on file prior to visit.    Past Medical History:  Diagnosis Date  . Allergic rhinitis   . Allergy   . B12 deficiency   . Carpal tunnel syndrome   . Dysfunction of eustachian tube     Past Surgical History:  Procedure Laterality Date  . BUNIONECTOMY     right   . CHOLECYSTECTOMY N/A 09/25/2015   Procedure: LAPAROSCOPIC CHOLECYSTECTOMY WITH INTRAOPERATIVE CHOLANGIOGRAM;  Surgeon: Alphonsa Overall, MD;  Location: [facility];  Service: General;  Laterality: N/A;  . TONSILLECTOMY      Social History   Socioeconomic History  . Marital status: Single    Spouse name: Not on file  . Number of children:  0  . Years of education: Assoc  . Highest education level: Not on file  Occupational History  . Occupation: Personal assistant  Tobacco Use  . Smoking status: Never Smoker  . Smokeless tobacco: Never Used  Substance and Sexual Activity  . Alcohol use: No  . Drug use: No  . Sexual activity: Not on file  Other Topics Concern  . Not on file  Social History Narrative   College grad   Single   Bought her own home in '09   Occupation: Programme researcher, broadcasting/film/video and cleaning   Fun: Resting, yard work   Denies religious beliefs effecting health care.    Drinks caffeine daily    Social Determinants of Health   Financial Resource Strain:   . Difficulty of Paying Living  Expenses:   Food Insecurity:   . Worried About Charity fundraiser in the Last Year:   . Arboriculturist in the Last Year:   Transportation Needs:   . Film/video editor (Medical):   Marland Kitchen Lack of Transportation (Non-Medical):   Physical Activity:   . Days of Exercise per Week:   . Minutes of Exercise per Session:   Stress:   . Feeling of Stress :   Social Connections:   . Frequency of Communication with Friends and Family:   . Frequency of Social Gatherings with Friends and Family:   . Attends Religious Services:   . Active Member of Clubs or Organizations:   . Attends Archivist Meetings:   Marland Kitchen Marital Status:     Family History  Problem Relation Age of Onset  . Heart disease Father   . Hypertension Father   . Diabetes Father   . Hyperlipidemia Mother   . Hypertension Mother   . Heart disease Maternal Grandmother   . Headache Neg Hx     Review of Systems  Constitutional: Negative for fever.  HENT: Negative for trouble swallowing.   Respiratory: Negative for cough, shortness of breath and wheezing.   Cardiovascular: Positive for leg swelling. Negative for chest pain and palpitations.  Skin: Positive for rash.       Objective:   Vitals:   04/15/19 1343  BP: 124/80  Pulse: 93  Resp: 16  Temp: 98.4 F (36.9 C)  SpO2: 99%   BP Readings from Last 3 Encounters:  04/15/19 124/80  02/28/19 128/82  01/16/19 110/66   Wt Readings from Last 3 Encounters:  04/15/19 208 lb 6.4 oz (94.5 kg)  11/27/18 202 lb 4 oz (91.7 kg)  09/17/18 197 lb (89.4 kg)   Body mass index is 34.68 kg/m.   Physical Exam Constitutional:      Appearance: Normal appearance.  HENT:     Head: Normocephalic and atraumatic.  Pulmonary:     Effort: Pulmonary effort is normal.  Skin:    General: Skin is warm and dry.     Findings: Rash (Erythematous, weblike rash bilateral upper legs, very faint on upper arms.  Slight warmth.  No obvious papules, blisters) present.  Neurological:       Mental Status: She is alert.     Sensory: No sensory deficit.            Assessment & Plan:    See Problem List for Assessment and Plan of chronic medical problems.     This visit occurred during the SARS-CoV-2 public health emergency.  Safety protocols were in place, including screening questions prior to the visit, additional usage of staff PPE,  and extensive cleaning of exam room while observing appropriate contact time as indicated for disinfecting solutions.

## 2019-04-15 NOTE — Patient Instructions (Addendum)
You had a steroid injection here today.     Start the prednisone taper tomorrow.  Take in the morning with food.    You can take an anti-histamine or benadryl.     Please call if there is no improvement in your symptoms.

## 2019-05-13 ENCOUNTER — Ambulatory Visit: Payer: 59 | Admitting: Internal Medicine

## 2019-05-13 ENCOUNTER — Ambulatory Visit: Payer: 59

## 2019-05-15 ENCOUNTER — Other Ambulatory Visit: Payer: Self-pay

## 2019-05-15 ENCOUNTER — Ambulatory Visit (INDEPENDENT_AMBULATORY_CARE_PROVIDER_SITE_OTHER): Payer: 59 | Admitting: *Deleted

## 2019-05-15 DIAGNOSIS — E538 Deficiency of other specified B group vitamins: Secondary | ICD-10-CM

## 2019-05-15 MED ORDER — CYANOCOBALAMIN 1000 MCG/ML IJ SOLN
1000.0000 ug | Freq: Once | INTRAMUSCULAR | Status: AC
  Administered 2019-05-15: 1000 ug via INTRAMUSCULAR

## 2019-05-15 NOTE — Progress Notes (Addendum)
Pls cosign for B12 inj../lmb I have reviewed and agree  

## 2019-06-14 ENCOUNTER — Ambulatory Visit: Payer: 59

## 2019-06-14 ENCOUNTER — Ambulatory Visit (INDEPENDENT_AMBULATORY_CARE_PROVIDER_SITE_OTHER): Payer: 59 | Admitting: *Deleted

## 2019-06-14 ENCOUNTER — Other Ambulatory Visit: Payer: Self-pay

## 2019-06-14 DIAGNOSIS — E538 Deficiency of other specified B group vitamins: Secondary | ICD-10-CM

## 2019-06-14 MED ORDER — CYANOCOBALAMIN 1000 MCG/ML IJ SOLN
1000.0000 ug | Freq: Once | INTRAMUSCULAR | Status: AC
  Administered 2019-06-14: 1000 ug via INTRAMUSCULAR

## 2019-06-14 NOTE — Progress Notes (Signed)
Pls cosign for B12 inj since PCP is out of the office.Marland KitchenJohny [name]

## 2019-07-15 ENCOUNTER — Ambulatory Visit (INDEPENDENT_AMBULATORY_CARE_PROVIDER_SITE_OTHER): Payer: 59

## 2019-07-15 ENCOUNTER — Other Ambulatory Visit: Payer: Self-pay

## 2019-07-15 DIAGNOSIS — E538 Deficiency of other specified B group vitamins: Secondary | ICD-10-CM

## 2019-07-15 MED ORDER — CYANOCOBALAMIN 1000 MCG/ML IJ SOLN
1000.0000 ug | Freq: Once | INTRAMUSCULAR | Status: AC
  Administered 2019-07-15: 1000 ug via INTRAMUSCULAR

## 2019-07-15 NOTE — Progress Notes (Signed)
Vit B12 given to pt w/o any complications. Pt tolerated well.

## 2019-07-30 ENCOUNTER — Other Ambulatory Visit: Payer: Self-pay

## 2019-07-30 ENCOUNTER — Ambulatory Visit (INDEPENDENT_AMBULATORY_CARE_PROVIDER_SITE_OTHER): Payer: 59

## 2019-07-30 ENCOUNTER — Encounter: Payer: Self-pay | Admitting: Internal Medicine

## 2019-07-30 ENCOUNTER — Ambulatory Visit: Payer: 59 | Admitting: Internal Medicine

## 2019-07-30 VITALS — BP 122/62 | HR 85 | Temp 98.1°F | Ht 65.0 in | Wt 208.4 lb

## 2019-07-30 DIAGNOSIS — K219 Gastro-esophageal reflux disease without esophagitis: Secondary | ICD-10-CM | POA: Diagnosis not present

## 2019-07-30 DIAGNOSIS — R05 Cough: Secondary | ICD-10-CM

## 2019-07-30 DIAGNOSIS — R059 Cough, unspecified: Secondary | ICD-10-CM

## 2019-07-30 DIAGNOSIS — R0789 Other chest pain: Secondary | ICD-10-CM | POA: Diagnosis not present

## 2019-07-30 DIAGNOSIS — R079 Chest pain, unspecified: Secondary | ICD-10-CM | POA: Insufficient documentation

## 2019-07-30 DIAGNOSIS — G5601 Carpal tunnel syndrome, right upper limb: Secondary | ICD-10-CM | POA: Diagnosis not present

## 2019-07-30 DIAGNOSIS — F5104 Psychophysiologic insomnia: Secondary | ICD-10-CM

## 2019-07-30 DIAGNOSIS — Z1211 Encounter for screening for malignant neoplasm of colon: Secondary | ICD-10-CM | POA: Insufficient documentation

## 2019-07-30 LAB — BASIC METABOLIC PANEL
BUN: 8 mg/dL (ref 6–23)
CO2: 27 mEq/L (ref 19–32)
Calcium: 9.4 mg/dL (ref 8.4–10.5)
Chloride: 104 mEq/L (ref 96–112)
Creatinine, Ser: 0.81 mg/dL (ref 0.40–1.20)
GFR: 90.61 mL/min (ref >60.00)
Glucose, Bld: 108 mg/dL — ABNORMAL HIGH (ref 70–99)
Potassium: 3.7 mEq/L (ref 3.5–5.1)
Sodium: 141 mEq/L (ref 135–145)

## 2019-07-30 LAB — CBC WITH DIFFERENTIAL/PLATELET
Basophils Absolute: 0 10*3/uL (ref 0.0–0.1)
Basophils Relative: 0.5 % (ref 0.0–3.0)
Eosinophils Absolute: 0.2 10*3/uL (ref 0.0–0.7)
Eosinophils Relative: 2.1 % (ref 0.0–5.0)
HCT: 40.5 % (ref 36.0–46.0)
Hemoglobin: 13.7 g/dL (ref 12.0–15.0)
Lymphocytes Relative: 22.6 % (ref 12.0–46.0)
Lymphs Abs: 1.8 10*3/uL (ref 0.7–4.0)
MCHC: 33.8 g/dL (ref 30.0–36.0)
MCV: 92.3 fl (ref 78.0–100.0)
Monocytes Absolute: 0.6 10*3/uL (ref 0.1–1.0)
Monocytes Relative: 7.8 % (ref 3.0–12.0)
Neutro Abs: 5.2 10*3/uL (ref 1.4–7.7)
Neutrophils Relative %: 67 % (ref 43.0–77.0)
Platelets: 397 10*3/uL (ref 150.0–400.0)
RBC: 4.39 Mil/uL (ref 3.87–5.11)
RDW: 13.7 % (ref 11.5–15.5)
WBC: 7.8 10*3/uL (ref 4.0–10.5)

## 2019-07-30 LAB — TROPONIN I (HIGH SENSITIVITY): High Sens Troponin I: 3 ng/L (ref 2–17)

## 2019-07-30 LAB — BRAIN NATRIURETIC PEPTIDE: Pro B Natriuretic peptide (BNP): 55 pg/mL (ref 0.0–100.0)

## 2019-07-30 LAB — D-DIMER, QUANTITATIVE: D-Dimer, Quant: 0.4 mcg/mL FEU (ref <0.50)

## 2019-07-30 MED ORDER — DAYVIGO 5 MG PO TABS: 1.0000 | ORAL_TABLET | Freq: Every evening | ORAL | 1 refills | Status: DC | PRN

## 2019-07-30 NOTE — Patient Instructions (Signed)

## 2019-07-30 NOTE — Progress Notes (Signed)
Subjective:  Patient ID: Rhonda [name], female    DOB: Sep 08, 1970  Age: 49 y.o. MRN: [id number]  CC: Cough and Chest Pain  This visit occurred during the SARS-CoV-2 public health emergency.  Safety protocols were in place, including screening questions prior to the visit, additional usage of staff PPE, and extensive cleaning of exam room while observing appropriate contact time as indicated for disinfecting solutions.    HPI Rhonda [name] presents for f/up -  1.  Rhonda [name] complains of a several week history of intermittent chest discomfort at rest.  Rhonda [name] describes a twinge and a grabbing sensation under her sternum.  The pain is not worsened with activity.  Rhonda [name] takes Nexium for gastroesophageal reflux disease and denies any recent episodes of odynophagia, dysphagia, loss of appetite, or weight loss.  Rhonda [name] has mild intermittent heartburn.  Rhonda [name] has had a few episodes of dizziness but Rhonda [name] denies diaphoresis, palpitations, edema, or fatigue.  Rhonda [name] has had a mild nonproductive cough but Rhonda [name] denies hemoptysis or pleuritic pain.  2.  Rhonda [name] has a history of carpal tunnel syndrome and complains of a 41-month history of discomfort and paresthesias in her right hand.  3.  Rhonda [name] is under quite a bit of stress and complains of insomnia with difficulty falling asleep and frequent awakenings.  Rhonda [name] has not tried anything to treat the insomnia.  Outpatient Medications Prior to Visit  Medication Sig Dispense Refill  . acetaminophen (TYLENOL) 325 MG tablet Take 325 mg by mouth every 4 (four) hours as needed for fever.    . clotrimazole-betamethasone (LOTRISONE) cream APP EXT AA BID PRN    . esomeprazole (NEXIUM) 40 MG capsule Take 1 capsule (40 mg total) by mouth daily at 12 noon. 90 capsule 0  . Multiple Vitamin (MULTIVITAMIN) tablet Take 1 tablet by mouth daily.    . potassium chloride SA (KLOR-CON) 20 MEQ tablet Take 1 tablet (20 mEq total) by mouth 2 (two) times daily. 180 tablet 1  . diphenhydrAMINE (BENADRYL) 50  MG tablet Take 1 tablet (50 mg total) by mouth at bedtime as needed for itching or allergies. (Patient not taking: Reported on 07/30/2019) 30 tablet 0  . loteprednol (ALREX) 0.2 % SUSP Alrex 0.2 % eye drops,suspension (Patient not taking: Reported on 07/30/2019)    . predniSONE (DELTASONE) 10 MG tablet Take 4 tabs po qd x 2 days, then 3 tabs po qd x 2 days, then 2 tabs po qd x 2 days, then 1 tab po qd x 2 days (Patient not taking: Reported on 07/30/2019) 20 tablet 0  . terconazole (TERAZOL 3) 0.8 % vaginal cream terconazole 0.8 % vaginal cream  INSERT 1 APPLICATORFUL VAGINALLY QD FOR 7 DAYS UTD (Patient not taking: Reported on 07/30/2019)     No facility-administered medications prior to visit.    ROS Review of Systems  Constitutional: Negative for chills, diaphoresis, fatigue and fever.  HENT: Negative.  Negative for sore throat, trouble swallowing and voice change.   Eyes: Negative.   Respiratory: Positive for cough. Negative for chest tightness, shortness of breath and wheezing.   Cardiovascular: Positive for chest pain. Negative for palpitations and leg swelling.  Gastrointestinal: Negative for abdominal pain, blood in stool, constipation, diarrhea, nausea and vomiting.  Genitourinary: Negative.  Negative for difficulty urinating.  Musculoskeletal: Negative.  Negative for arthralgias and myalgias.  Skin: Negative.  Negative for color change, pallor and rash.  Neurological: Negative.  Negative for dizziness, weakness, light-headedness, numbness and headaches.  Hematological: Negative for adenopathy.  Does not bruise/bleed easily.  Psychiatric/Behavioral: Positive for sleep disturbance. Negative for behavioral problems, confusion, decreased concentration, dysphoric mood and suicidal ideas. The patient is not nervous/anxious.     Objective:  BP 122/62 (BP Location: Left Arm, Patient Position: Sitting, Cuff Size: Large)   Pulse 85   Temp 98.1 F (36.7 C) (Oral)   Ht 5\' 5"  (1.651 m)   Wt 208 lb  6 oz (94.5 kg)   LMP 07/06/2019   SpO2 98%   BMI 34.68 kg/m   BP Readings from Last 3 Encounters:  07/30/19 122/62  04/15/19 124/80  02/28/19 128/82    Wt Readings from Last 3 Encounters:  07/30/19 208 lb 6 oz (94.5 kg)  04/15/19 208 lb 6.4 oz (94.5 kg)  11/27/18 202 lb 4 oz (91.7 kg)    Physical Exam Vitals reviewed.  Constitutional:      Appearance: Normal appearance. Rhonda [name] is well-developed.  HENT:     Nose: Nose normal.     Mouth/Throat:     Mouth: Mucous membranes are moist.  Eyes:     General: No scleral icterus.    Conjunctiva/sclera: Conjunctivae normal.  Neck:     Thyroid: No thyromegaly.  Cardiovascular:     Rate and Rhythm: Normal rate and regular rhythm.     Heart sounds: No murmur heard.  No friction rub. No gallop.      Comments: EKG - NSR, 81 bpm Normal EKG Pulmonary:     Effort: Pulmonary effort is normal.     Breath sounds: No stridor. No wheezing, rhonchi or rales.  Chest:     Chest wall: No mass, deformity, swelling, tenderness, crepitus or edema.  Abdominal:     General: Abdomen is flat.     Palpations: There is no mass.     Tenderness: There is no abdominal tenderness. There is no guarding.  Musculoskeletal:        General: Normal range of motion.     Cervical back: Neck supple.     Right lower leg: No edema.     Left lower leg: No edema.  Lymphadenopathy:     Cervical: No cervical adenopathy.     Upper Body:     Right upper body: No supraclavicular, axillary or pectoral adenopathy.     Left upper body: No supraclavicular, axillary or pectoral adenopathy.  Skin:    General: Skin is warm and dry.     Coloration: Skin is not pale.  Neurological:     General: No focal deficit present.     Mental Status: Rhonda [name] is alert and oriented to person, place, and time. Mental status is at baseline.     Lab Results  Component Value Date   WBC 7.8 07/30/2019   HGB 13.7 07/30/2019   HCT 40.5 07/30/2019   PLT 397.0 07/30/2019   GLUCOSE 108 (H)  07/30/2019   CHOL 173 11/27/2018   TRIG 99.0 11/27/2018   HDL 56.30 11/27/2018   LDLCALC 97 11/27/2018   ALT 18 02/28/2019   AST 21 02/28/2019   NA 141 07/30/2019   K 3.7 07/30/2019   CL 104 07/30/2019   CREATININE 0.81 07/30/2019   BUN 8 07/30/2019   CO2 27 07/30/2019   TSH 0.49 01/18/2018   HGBA1C 5.1 09/27/2016    DG Chest 2 View  Result Date: 07/30/2019 CLINICAL DATA:  Cough, chest pain EXAM: CHEST - 2 VIEW COMPARISON:  November 27, 2018. FINDINGS: The heart size and mediastinal contours are within normal limits. Both lungs  are clear. No pneumothorax or pleural effusion is noted. The visualized skeletal structures are unremarkable. IMPRESSION: No active cardiopulmonary disease. Electronically Signed   By: Marijo Conception M.D.   On: 07/30/2019 12:45    Assessment & Plan:   Rhonda [name] was seen today for cough and chest pain.  Diagnoses and all orders for this visit:  Other chest pain- Based on her symptoms, exam, chest x-ray, and lab work I do not see any evidence of cardiopulmonary disease.  I am concerned her symptoms may be related to an upper GI disorder so I have asked her to see gastroenterology. -     Basic metabolic panel; Future -     CBC with Differential/Platelet; Future -     Brain natriuretic peptide; Future -     Troponin I (High Sensitivity); Future -     D-dimer, quantitative (not at [facility]); Future -     EKG 12-Lead -     D-dimer, quantitative (not at [facility]) -     Troponin I (High Sensitivity) -     Brain natriuretic peptide -     CBC with Differential/Platelet -     Basic metabolic panel  Cough- Her chest x-ray is negative for mass or infiltrate.  This is likely a viral URI.  Rhonda [name] will let me know if Rhonda [name] develops any new or worsening symptoms. -     Basic metabolic panel; Future -     CBC with Differential/Platelet; Future -     DG Chest 2 View; Future -     CBC with Differential/Platelet -     Basic metabolic panel  Screen for colon cancer -     Ambulatory  referral to Gastroenterology  Gastroesophageal reflux disease without esophagitis -     Ambulatory referral to Gastroenterology  Carpal tunnel syndrome of right wrist- I recommended that Rhonda [name] see hand surgery about this. -     Ambulatory referral to Orthopedic Surgery  Psychophysiological insomnia -     Lemborexant (DAYVIGO) 5 MG TABS; Take 1 tablet by mouth at bedtime as needed.   I have discontinued Samuel Germany. Rita's terconazole, Alrex, diphenhydrAMINE, and predniSONE. I am also having her start on DayVigo. Additionally, I am having her maintain her esomeprazole, acetaminophen, clotrimazole-betamethasone, potassium chloride SA, and multivitamin.  Meds ordered this encounter  Medications  . Lemborexant (DAYVIGO) 5 MG TABS    Sig: Take 1 tablet by mouth at bedtime as needed.    Dispense:  30 tablet    Refill:  1   I spent 50 minutes in preparing to see the patient by review of recent labs, imaging and procedures, obtaining and reviewing separately obtained history, communicating with the patient and family or caregiver, ordering medications, tests or procedures, and documenting clinical information in the EHR including the differential Dx, treatment, and any further evaluation and other management of 1. Other chest pain 2. Cough 3. Gastroesophageal reflux disease without esophagitis 4. Carpal tunnel syndrome of right wrist 5. Psychophysiological insomnia     Follow-up: Return in about 3 months (around 10/30/2019).  Scarlette Calico, MD

## 2019-07-31 ENCOUNTER — Encounter: Payer: Self-pay | Admitting: Internal Medicine

## 2019-08-02 ENCOUNTER — Encounter: Payer: Self-pay | Admitting: Internal Medicine

## 2019-08-02 ENCOUNTER — Encounter: Payer: Self-pay | Admitting: Gastroenterology

## 2019-08-02 ENCOUNTER — Other Ambulatory Visit: Payer: Self-pay

## 2019-08-02 ENCOUNTER — Ambulatory Visit: Payer: 59 | Admitting: Internal Medicine

## 2019-08-02 VITALS — BP 124/74 | HR 105 | Temp 98.6°F | Ht 65.0 in | Wt 212.0 lb

## 2019-08-02 DIAGNOSIS — J0110 Acute frontal sinusitis, unspecified: Secondary | ICD-10-CM

## 2019-08-02 DIAGNOSIS — H6983 Other specified disorders of Eustachian tube, bilateral: Secondary | ICD-10-CM

## 2019-08-02 DIAGNOSIS — M7989 Other specified soft tissue disorders: Secondary | ICD-10-CM | POA: Diagnosis not present

## 2019-08-02 MED ORDER — LEVOFLOXACIN 500 MG PO TABS: 500.0000 mg | ORAL_TABLET | Freq: Every day | ORAL | 0 refills | Status: DC

## 2019-08-02 NOTE — Assessment & Plan Note (Signed)
Referral to ENT °

## 2019-08-02 NOTE — Progress Notes (Signed)
   Subjective:   Patient ID: Rhonda [name], female    DOB: 1970-10-23, 49 y.o.   MRN: [id number]  HPI The patient is a 49 YO female coming in for concerns about sinus infection (having some current dizziness, sinus pain, ongoing for 3 weeks, was seen earlier this week but felt this concern was not addressed due to other concerns, denies fevers or chills, overall worsening, some bump in the left ear canal which ruptured but still painful and present) and eustachian tube dysfunction (previously had seen ENT and was recommended to have tubes placed but her ENT retired before this could be done, she has not established a new ENT but would like to do this, has a lot of problems with sinus drainage and feeling of fullness in her ears) and leg swelling (had recent testing done, feels like she does not have an understanding of cause, wants to know what she can do to help, if she needs further testing).   Review of Systems  Constitutional: Negative.   HENT: Positive for congestion, ear discharge, ear pain, rhinorrhea, sinus pressure and sinus pain.   Eyes: Negative.   Respiratory: Negative for cough, chest tightness and shortness of breath.   Cardiovascular: Positive for leg swelling. Negative for chest pain and palpitations.  Gastrointestinal: Negative for abdominal distention, abdominal pain, constipation, diarrhea, nausea and vomiting.  Musculoskeletal: Negative.   Skin: Negative.   Neurological: Negative.   Psychiatric/Behavioral: Negative.     Objective:  Physical Exam Constitutional:      Appearance: She is well-developed.  HENT:     Head: Normocephalic and atraumatic.     Comments: Oropharynx with redness and drainage, nose with swollen turbinates, TMs bulging clear fluid bilaterally, left external ear canal with a lesion which has ruptured and no skin breakdown but some tenderness present, no expressible drainage Neck:     Thyroid: No thyromegaly.  Cardiovascular:     Rate and Rhythm:  Normal rate and regular rhythm.  Pulmonary:     Effort: Pulmonary effort is normal. No respiratory distress.     Breath sounds: Normal breath sounds. No wheezing or rales.  Abdominal:     General: Bowel sounds are normal. There is no distension.     Palpations: Abdomen is soft.     Tenderness: There is no abdominal tenderness. There is no rebound.  Musculoskeletal:     Cervical back: Normal range of motion.     Comments: 1+ leg edema bilateral non-pitting  Lymphadenopathy:     Cervical: No cervical adenopathy.  Skin:    General: Skin is warm and dry.  Neurological:     Mental Status: She is alert and oriented to person, place, and time.     Coordination: Coordination normal.     Vitals:   08/02/19 1531  BP: 124/74  Pulse: (!) 105  Temp: 98.6 F (37 C)  TempSrc: Oral  SpO2: 97%  Weight: 212 lb (96.2 kg)  Height: 5\' 5"  (1.651 m)    This visit occurred during the SARS-CoV-2 public health emergency.  Safety protocols were in place, including screening questions prior to the visit, additional usage of staff PPE, and extensive cleaning of exam room while observing appropriate contact time as indicated for disinfecting solutions.   Assessment & Plan:

## 2019-08-02 NOTE — Assessment & Plan Note (Signed)
Likely venous insufficiency and explained to her mechanism of action as well as need to reduce sodium intake, increase water intake. Explained possible study of venous reflux [place] to check for significant reflux. Offered compression stockings and elevate legs as well to help.

## 2019-08-02 NOTE — Assessment & Plan Note (Signed)
Rx levaquin due to multiple other drug intolerances.

## 2019-08-02 NOTE — Patient Instructions (Signed)
We have sent in the levaquin to take 1 pill daily to clear the sinuses for 1 week.

## 2019-08-14 ENCOUNTER — Encounter: Payer: Self-pay | Admitting: Gastroenterology

## 2019-08-14 ENCOUNTER — Ambulatory Visit: Payer: 59 | Admitting: Gastroenterology

## 2019-08-14 VITALS — BP 104/70 | HR 72 | Ht 65.0 in | Wt 210.0 lb

## 2019-08-14 DIAGNOSIS — E538 Deficiency of other specified B group vitamins: Secondary | ICD-10-CM

## 2019-08-14 DIAGNOSIS — K219 Gastro-esophageal reflux disease without esophagitis: Secondary | ICD-10-CM

## 2019-08-14 DIAGNOSIS — Z1211 Encounter for screening for malignant neoplasm of colon: Secondary | ICD-10-CM

## 2019-08-14 MED ORDER — SUTAB 1479-225-188 MG PO TABS: 1.0000 | ORAL_TABLET | Freq: Once | ORAL | 0 refills | Status: AC

## 2019-08-14 NOTE — Progress Notes (Signed)
HPI :  49 y/o female with a history of B12 deficiency, GERD, allergies, referred here by Scarlette Calico MD for GERD, B12 deficiency, colon cancer screening.  H/o B12 deficiency, level of 182 in 05/2017, negative anti-parietal cell testing in 08/2018.  She states she has had symptoms of reflux ongoing for several years.  Symptoms include pyrosis and regurgitation, regurgitation which can often bother her at night.  Is been going on since the 1990s, sounds like she has been on Nexium daily or as needed for several years during this course.  She states it helps when she takes it and when she does not take it she has breakthrough that really bothers her.  When she does take it her symptoms are mostly controlled.  She has several foods that can trigger her symptoms such as tomato-based and spicy foods.  She denies any dysphagia.  She has had some chest discomfort at times in association with her reflux symptoms, feels it in the mid chest, comes and goes, is not exertional at all.  She can feel this 3-4 times per week, she has had a work-up with her primary care who did not think this was cardiac.  She states that she takes her Nexium every day and will help the symptoms.  She has occasional nausea but no vomiting.  She has no bowel issues that bother her, no blood in the stools.  Her uncle had colon cancer but no first-degree history of colon cancer.  Her father did have colon polyps.  She has never had a prior colonoscopy.  She is here to discuss colon cancer screening as well.    Past Medical History:  Diagnosis Date  . Allergic rhinitis   . Allergy   . B12 deficiency   . Carpal tunnel syndrome   . Dysfunction of eustachian tube      Past Surgical History:  Procedure Laterality Date  . BUNIONECTOMY     right   . CHOLECYSTECTOMY N/A 09/25/2015   Procedure: LAPAROSCOPIC CHOLECYSTECTOMY WITH INTRAOPERATIVE CHOLANGIOGRAM;  Surgeon: Alphonsa Overall, MD;  Location: [facility];  Service: General;  Laterality:  N/A;  . TONSILLECTOMY     Family History  Problem Relation Age of Onset  . Heart disease Father   . Hypertension Father   . Diabetes Father   . Hyperlipidemia Mother   . Hypertension Mother   . Heart disease Maternal Grandmother   . Diabetes Brother   . Headache Neg Hx    Social History   Tobacco Use  . Smoking status: Never Smoker  . Smokeless tobacco: Never Used  Vaping Use  . Vaping Use: Never used  Substance Use Topics  . Alcohol use: No  . Drug use: No   Current Outpatient Medications  Medication Sig Dispense Refill  . acetaminophen (TYLENOL) 325 MG tablet Take 325 mg by mouth every 4 (four) hours as needed for fever.    . esomeprazole (NEXIUM) 20 MG capsule Take 20 mg by mouth daily at 12 noon.    . Multiple Vitamin (MULTIVITAMIN) tablet Take 1 tablet by mouth daily.    . Lemborexant (DAYVIGO) 5 MG TABS Take 1 tablet by mouth at bedtime as needed. (Patient not taking: Reported on 08/14/2019) 30 tablet 1   No current facility-administered medications for this visit.   Allergies  Allergen Reactions  . Bactrim Ds [Sulfamethoxazole-Trimethoprim] Other (See Comments)    Blisters   . Cephalosporins     Hives   . Oseltamivir Phosphate Swelling  .  Amoxicillin Hives and Rash    Has patient had a PCN reaction causing immediate rash, facial/tongue/throat swelling, SOB or lightheadedness with hypotension: Yes Has patient had a PCN reaction causing severe rash involving mucus membranes or skin necrosis: Yes Has patient had a PCN reaction that required hospitalization No Has patient had a PCN reaction occurring within the last 10 years: Yes If all of the above answers are "NO", then may proceed with Cephalosporin use.   Marland Kitchen Doxycycline Rash  . Fluconazole Rash     Review of Systems: All systems reviewed and negative except where noted in HPI.    DG Chest 2 View  Result Date: 07/30/2019 CLINICAL DATA:  Cough, chest pain EXAM: CHEST - 2 VIEW COMPARISON:  November 27, 2018.  FINDINGS: The heart size and mediastinal contours are within normal limits. Both lungs are clear. No pneumothorax or pleural effusion is noted. The visualized skeletal structures are unremarkable. IMPRESSION: No active cardiopulmonary disease. Electronically Signed   By: Marijo Conception M.D.   On: 07/30/2019 12:45    Lab Results  Component Value Date   WBC 7.8 07/30/2019   HGB 13.7 07/30/2019   HCT 40.5 07/30/2019   MCV 92.3 07/30/2019   PLT 397.0 07/30/2019    Lab Results  Component Value Date   CREATININE 0.81 07/30/2019   BUN 8 07/30/2019   NA 141 07/30/2019   K 3.7 07/30/2019   CL 104 07/30/2019   CO2 27 07/30/2019    Lab Results  Component Value Date   ALT 18 02/28/2019   AST 21 02/28/2019   ALKPHOS 81 02/28/2019   BILITOT 0.9 02/28/2019     Physical Exam: BP 104/70   Pulse 72   Ht 5\' 5"  (1.651 m)   Wt 210 lb (95.3 kg)   BMI 34.95 kg/m  Constitutional: Pleasant,well-developed, female in no acute distress. HEENT: Normocephalic and atraumatic. Conjunctivae are normal. No scleral icterus. Neck supple.  Cardiovascular: Normal rate, regular rhythm.  Pulmonary/chest: Effort normal and breath sounds normal. No wheezing, rales or rhonchi. Abdominal: Soft, nondistended, nontender.  There are no masses palpable. Extremities: no edema Lymphadenopathy: No cervical adenopathy noted. Neurological: Alert and oriented to person place and time. Skin: Skin is warm and dry. No rashes noted. Psychiatric: Normal mood and affect. Behavior is normal.   ASSESSMENT AND PLAN: 49 year old female here for new patient assessment of the following:  GERD / B12 deficiency - longstanding symptoms with use of PPI since the 1990s.  Generally if she takes her Nexium she tends to do pretty well, but does have breakthrough in symptoms when she does not take it as well as chest discomfort.  Symptoms seem to reliably respond to PPI.  We discussed natural history of reflux, risks of complications  from this.  She also has a B12 deficiency, possible could be related to chronic PPI use versus other, her antiparietal cell antibody was negative years ago.  Given her longstanding symptoms and intermittent chest discomfort, as well as B12 deficiency, symptoms which does not seem cardiac based off of history and prior evaluation, I offered her an upper endoscopy to further evaluate.  We discussed risks and benefits and she wanted to proceed to have this done at the same time as her colonoscopy.  We otherwise discussed long-term risk benefits of chronic PPI use, for now we will continue low-dose Nexium as she tolerates it and works pretty well to control her symptoms, recommend she use it daily if she is having symptoms when  she misses a dose.  Colon cancer screening - no prior colon cancer screening, asymptomatic, no anemia.  Discussed colon cancer screening options with her, recommend optical colonoscopy if she is amenable, following discussion of risks and benefits she wants to proceed with colonoscopy, further recommendations pending the results.  [place]wanville Cellar, MD [facility]  CC: Janith Lima, MD

## 2019-08-14 NOTE — Patient Instructions (Addendum)
If you are age 50 or older, your body mass index should be between 23-30. Your Body mass index is 34.95 kg/m. If this is out of the aforementioned range listed, please consider follow up with your Primary Care Provider.  If you are age 26 or younger, your body mass index should be between 19-25. Your Body mass index is 34.95 kg/m. If this is out of the aformentioned range listed, please consider follow up with your Primary Care Provider.   You have been scheduled for an endoscopy and colonoscopy. Please follow the written instructions given to you at your visit today. Please pick up your prep supplies at the pharmacy within the next 1-3 days. If you use inhalers (even only as needed), please bring them with you on the day of your procedure.   Take your Nexium every day to prevent break-through symptoms.   Thank you for entrusting me with your care and for choosing [facility], Dr. Plain Cellar

## 2019-08-15 ENCOUNTER — Ambulatory Visit (INDEPENDENT_AMBULATORY_CARE_PROVIDER_SITE_OTHER): Payer: 59 | Admitting: *Deleted

## 2019-08-15 ENCOUNTER — Other Ambulatory Visit: Payer: Self-pay

## 2019-08-15 DIAGNOSIS — E538 Deficiency of other specified B group vitamins: Secondary | ICD-10-CM

## 2019-08-15 MED ORDER — CYANOCOBALAMIN 1000 MCG/ML IJ SOLN
1000.0000 ug | Freq: Once | INTRAMUSCULAR | Status: AC
  Administered 2019-08-15: 1000 ug via INTRAMUSCULAR

## 2019-08-15 NOTE — Progress Notes (Signed)
Pls cosign for B12 inj../lmb  

## 2019-09-02 ENCOUNTER — Encounter: Payer: Self-pay | Admitting: Gastroenterology

## 2019-09-03 ENCOUNTER — Ambulatory Visit (INDEPENDENT_AMBULATORY_CARE_PROVIDER_SITE_OTHER): Payer: 59 | Admitting: Otolaryngology

## 2019-09-10 ENCOUNTER — Ambulatory Visit (INDEPENDENT_AMBULATORY_CARE_PROVIDER_SITE_OTHER): Payer: 59 | Admitting: Otolaryngology

## 2019-09-10 ENCOUNTER — Other Ambulatory Visit: Payer: Self-pay

## 2019-09-10 ENCOUNTER — Encounter (INDEPENDENT_AMBULATORY_CARE_PROVIDER_SITE_OTHER): Payer: Self-pay | Admitting: Otolaryngology

## 2019-09-10 VITALS — Temp 97.3°F

## 2019-09-10 DIAGNOSIS — H6983 Other specified disorders of Eustachian tube, bilateral: Secondary | ICD-10-CM

## 2019-09-10 NOTE — Progress Notes (Signed)
HPI: Rhonda [name] is a 49 y.o. female who presents is referred by Dr. Sharlet Salina for evaluation of chronic ear complaints.  She complains of intermittent ear pain which is worse on the right side.  She also complains of fullness in the ears and popping in the ears.  She has had no drainage from the ear.  She presents today to have her ears checked.  She does not notice that much hearing problems. .  Past Medical History:  Diagnosis Date  . Allergic rhinitis   . Allergy   . B12 deficiency   . Carpal tunnel syndrome   . Dysfunction of eustachian tube    Past Surgical History:  Procedure Laterality Date  . BUNIONECTOMY     right   . CHOLECYSTECTOMY N/A 09/25/2015   Procedure: LAPAROSCOPIC CHOLECYSTECTOMY WITH INTRAOPERATIVE CHOLANGIOGRAM;  Surgeon: Alphonsa Overall, MD;  Location: [facility];  Service: General;  Laterality: N/A;  . TONSILLECTOMY     Social History   Socioeconomic History  . Marital status: Single    Spouse name: Not on file  . Number of children: 0  . Years of education: Assoc  . Highest education level: Not on file  Occupational History  . Occupation: Personal assistant  Tobacco Use  . Smoking status: Never Smoker  . Smokeless tobacco: Never Used  Vaping Use  . Vaping Use: Never used  Substance and Sexual Activity  . Alcohol use: No  . Drug use: No  . Sexual activity: Not on file  Other Topics Concern  . Not on file  Social History Narrative   College grad   Single   Bought her own home in '09   Occupation: Programme researcher, broadcasting/film/video and cleaning   Fun: Resting, yard work   Denies religious beliefs effecting health care.    Drinks caffeine daily    Social Determinants of Health   Financial Resource Strain:   . Difficulty of Paying Living Expenses:   Food Insecurity:   . Worried About Charity fundraiser in the Last Year:   . Arboriculturist in the Last Year:   Transportation Needs:   . Film/video editor (Medical):   Marland Kitchen Lack of Transportation (Non-Medical):   Physical  Activity:   . Days of Exercise per Week:   . Minutes of Exercise per Session:   Stress:   . Feeling of Stress :   Social Connections:   . Frequency of Communication with Friends and Family:   . Frequency of Social Gatherings with Friends and Family:   . Attends Religious Services:   . Active Member of Clubs or Organizations:   . Attends Archivist Meetings:   Marland Kitchen Marital Status:    Family History  Problem Relation Age of Onset  . Heart disease Father   . Hypertension Father   . Diabetes Father   . Hyperlipidemia Mother   . Hypertension Mother   . Heart disease Maternal Grandmother   . Diabetes Brother   . Headache Neg Hx    Allergies  Allergen Reactions  . Bactrim Ds [Sulfamethoxazole-Trimethoprim] Other (See Comments)    Blisters   . Cephalosporins     Hives   . Oseltamivir Phosphate Swelling  . Amoxicillin Hives and Rash    Has patient had a PCN reaction causing immediate rash, facial/tongue/throat swelling, SOB or lightheadedness with hypotension: Yes Has patient had a PCN reaction causing severe rash involving mucus membranes or skin necrosis: Yes Has patient had a PCN reaction that  required hospitalization No Has patient had a PCN reaction occurring within the last 10 years: Yes If all of the above answers are "NO", then may proceed with Cephalosporin use.   Marland Kitchen Doxycycline Rash  . Fluconazole Rash   Prior to Admission medications   Medication Sig Start Date End Date Taking? Authorizing Provider  acetaminophen (TYLENOL) 325 MG tablet Take 325 mg by mouth every 4 (four) hours as needed for fever.   Yes [provider]  esomeprazole (NEXIUM) 20 MG capsule Take 20 mg by mouth daily at 12 noon.   Yes [provider]  Lemborexant (DAYVIGO) 5 MG TABS Take 1 tablet by mouth at bedtime as needed. 07/30/19  Yes Janith Lima, MD  Multiple Vitamin (MULTIVITAMIN) tablet Take 1 tablet by mouth daily.   Yes [provider]     Positive ROS:  Otherwise negative  All other systems have been reviewed and were otherwise negative with the exception of those mentioned in the HPI and as above.  Physical Exam: Constitutional: Alert, well-appearing, no acute distress Ears: External ears without lesions or tenderness. Ear canals are clear bilaterally.  No significant wax buildup.  TMs are clear bilaterally with good mobility on pneumatic otoscopy.  On hearing screening with the 512 1024 tuning fork she heard well in both ears with AC > BC bilaterally and Weber midline.  No middle ear effusion noted. Nasal: External nose without lesions. Septum midline with mild rhinitis but no signs of infection.. Clear nasal passages bilaterally. Oral: Lips and gums without lesions. Tongue and palate mucosa without lesions. Posterior oropharynx clear.  Patient is status post tonsillectomy with clear tonsil regions bilaterally. Neck: No palpable adenopathy or masses.  No TMJ pain noted. Respiratory: Breathing comfortably  Skin: No facial/neck lesions or rash noted.  Procedures  Assessment: Chronic intermittent ear symptoms.  Questionable etiology.  Normal ear examination on microscopic exam with no middle ear abnormality noted or TM abnormality.  Perhaps related to eustachian tube dysfunction.  Plan: Recommended use of nasal steroid spray Nasacort 2 sprays each nostril at night as this should help benefit eustachian tube function and hopefully help with the popping or pressure in the ear. If symptoms persist she will call back to schedule audiologic testing.   Radene Journey, MD   CC:

## 2019-09-11 ENCOUNTER — Encounter: Payer: Self-pay | Admitting: Certified Registered Nurse Anesthetist

## 2019-09-12 ENCOUNTER — Other Ambulatory Visit: Payer: Self-pay

## 2019-09-12 ENCOUNTER — Ambulatory Visit (AMBULATORY_SURGERY_CENTER): Payer: 59 | Admitting: Gastroenterology

## 2019-09-12 ENCOUNTER — Encounter: Payer: Self-pay | Admitting: Gastroenterology

## 2019-09-12 VITALS — BP 113/82 | HR 75 | Temp 97.5°F | Resp 11 | Ht 65.0 in | Wt 210.0 lb

## 2019-09-12 DIAGNOSIS — K219 Gastro-esophageal reflux disease without esophagitis: Secondary | ICD-10-CM

## 2019-09-12 DIAGNOSIS — K2970 Gastritis, unspecified, without bleeding: Secondary | ICD-10-CM

## 2019-09-12 DIAGNOSIS — E538 Deficiency of other specified B group vitamins: Secondary | ICD-10-CM

## 2019-09-12 DIAGNOSIS — K317 Polyp of stomach and duodenum: Secondary | ICD-10-CM

## 2019-09-12 DIAGNOSIS — Z1211 Encounter for screening for malignant neoplasm of colon: Secondary | ICD-10-CM

## 2019-09-12 DIAGNOSIS — K2950 Unspecified chronic gastritis without bleeding: Secondary | ICD-10-CM

## 2019-09-12 LAB — HM COLONOSCOPY

## 2019-09-12 MED ORDER — SODIUM CHLORIDE 0.9 % IV SOLN: 500.0000 mL | Freq: Once | INTRAVENOUS | Status: DC

## 2019-09-12 NOTE — Progress Notes (Signed)
Report given to PACU, vss 

## 2019-09-12 NOTE — Patient Instructions (Signed)
Impression/Recommendations:  Gastritis handout given to patient. Diverticulosis handout given to patient. Hemorrhoid handout given to patient.  Resume previous diet. Continue present medications. Await pathology results.  Repeat colonoscopy in 10 years for screening purposes.  YOU HAD AN ENDOSCOPIC PROCEDURE TODAY AT [facility]:   Refer to the procedure report that was given to you for any specific questions about what was found during the examination.  If the procedure report does not answer your questions, please call your gastroenterologist to clarify.  If you requested that your care partner not be given the details of your procedure findings, then the procedure report has been included in a sealed envelope for you to review at your convenience later.  YOU SHOULD EXPECT: Some feelings of bloating in the abdomen. Passage of more gas than usual.  Walking can help get rid of the air that was put into your GI tract during the procedure and reduce the bloating. If you had a lower endoscopy (such as a colonoscopy or flexible sigmoidoscopy) you may notice spotting of blood in your stool or on the toilet paper. If you underwent a bowel prep for your procedure, you may not have a normal bowel movement for a few days.  Please Note:  You might notice some irritation and congestion in your nose or some drainage.  This is from the oxygen used during your procedure.  There is no need for concern and it should clear up in a day or so.  SYMPTOMS TO REPORT IMMEDIATELY:   Following lower endoscopy (colonoscopy or flexible sigmoidoscopy):  Excessive amounts of blood in the stool  Significant tenderness or worsening of abdominal pains  Swelling of the abdomen that is new, acute  Fever of 100F or higher   Following upper endoscopy (EGD)  Vomiting of blood or coffee ground material  New chest pain or pain under the shoulder blades  Painful or persistently difficult swallowing  New  shortness of breath  Fever of 100F or higher  Black, tarry-looking stools  For urgent or emergent issues, a gastroenterologist can be reached at any hour by calling [phone]. Do not use MyChart messaging for urgent concerns.    DIET:  We do recommend a small meal at first, but then you may proceed to your regular diet.  Drink plenty of fluids but you should avoid alcoholic beverages for 24 hours.  ACTIVITY:  You should plan to take it easy for the rest of today and you should NOT DRIVE or use heavy machinery until tomorrow (because of the sedation medicines used during the test).    FOLLOW UP: Our staff will call the number listed on your records 48-72 hours following your procedure to check on you and address any questions or concerns that you may have regarding the information given to you following your procedure. If we do not reach you, we will leave a message.  We will attempt to reach you two times.  During this call, we will ask if you have developed any symptoms of COVID 19. If you develop any symptoms (ie: fever, flu-like symptoms, shortness of breath, cough etc.) before then, please call [phone].  If you test positive for Covid 19 in the 2 weeks post procedure, please call and report this information to [place].    If any biopsies were taken you will be contacted by phone or by letter within the next 1-3 weeks.  Please call us at [phone] if you have not heard about the biopsies  in 3 weeks.    SIGNATURES/CONFIDENTIALITY: You and/or your care partner have signed paperwork which will be entered into your electronic medical record.  These signatures attest to the fact that that the information above on your After Visit Summary has been reviewed and is understood.  Full responsibility of the confidentiality of this discharge information lies with you and/or your care-partner.

## 2019-09-12 NOTE — Progress Notes (Signed)
Called to room to assist during endoscopic procedure.  Patient ID and intended procedure confirmed with present staff. Received instructions for my participation in the procedure from the performing physician.  

## 2019-09-12 NOTE — Op Note (Signed)
[place] Patient Name: Rhonda [name] Procedure Date: 09/12/2019 3:28 PM MRN: [id number] Endoscopist: Remo Lipps P. Havery Moros , MD Age: 49 Referring MD:  Date of Birth: 1970/06/10 Gender: Female Account #: [id number] Procedure:                Colonoscopy Indications:              Screening for colorectal malignant neoplasm, This                            is the patient's first colonoscopy Medicines:                Monitored Anesthesia Care Procedure:                Pre-Anesthesia Assessment:                           - Prior to the procedure, a History and Physical                            was performed, and patient medications and                            allergies were reviewed. The patient's tolerance of                            previous anesthesia was also reviewed. The risks                            and benefits of the procedure and the sedation                            options and risks were discussed with the patient.                            All questions were answered, and informed consent                            was obtained. Prior Anticoagulants: The patient has                            taken no previous anticoagulant or antiplatelet                            agents. ASA Grade Assessment: II - A patient with                            mild systemic disease. After reviewing the risks                            and benefits, the patient was deemed in                            satisfactory condition to undergo the procedure.  After obtaining informed consent, the colonoscope                            was passed under direct vision. Throughout the                            procedure, the patient's blood pressure, pulse, and                            oxygen saturations were monitored continuously. The                            Colonoscope was introduced through the anus and                            advanced to the the  terminal ileum, with                            identification of the appendiceal orifice and IC                            valve. The colonoscopy was performed without                            difficulty. The patient tolerated the procedure                            well. The quality of the bowel preparation was                            good. The ileocecal valve, appendiceal orifice, and                            rectum were photographed. Scope In: 3:43:48 PM Scope Out: 4:05:07 PM Scope Withdrawal Time: 0 hours 18 minutes 25 seconds  Total Procedure Duration: 0 hours 21 minutes 19 seconds  Findings:                 The perianal and digital rectal examinations were                            normal.                           The terminal ileum appeared normal.                           A few small-mouthed diverticula were found in the                            sigmoid colon.                           Internal hemorrhoids were found during  retroflexion. The hemorrhoids were small.                           The exam was otherwise without abnormality. No                            polyps. Complications:            No immediate complications. Estimated blood loss:                            None. Estimated Blood Loss:     Estimated blood loss: none. Impression:               - The examined portion of the ileum was normal.                           - Diverticulosis in the sigmoid colon.                           - Internal hemorrhoids.                           - The examination was otherwise normal.                           - No polyps Recommendation:           - Patient has a contact number available for                            emergencies. The signs and symptoms of potential                            delayed complications were discussed with the                            patient. Return to normal activities tomorrow.                             Written discharge instructions were provided to the                            patient.                           - Resume previous diet.                           - Continue present medications.                           - Repeat colonoscopy in 10 years for screening                            purposes. Remo Lipps P. Koi Yarbro, MD 09/12/2019 4:09:55 PM This report has been signed electronically.

## 2019-09-12 NOTE — Op Note (Signed)
Rhonda [name] Patient Name: Rhonda [name] Procedure Date: 09/12/2019 3:28 PM MRN: [id number] Endoscopist: Remo Lipps P. Havery Moros , MD Age: 49 Referring MD:  Date of Birth: 05-Nov-1970 Gender: Female Account #: [id number] Procedure:                Upper GI endoscopy Indications:              longstanding gastro-esophageal reflux disease, B12                            deficiency, on nexium 20mg  / day Medicines:                Monitored Anesthesia Care Procedure:                Pre-Anesthesia Assessment:                           - Prior to the procedure, a History and Physical                            was performed, and patient medications and                            allergies were reviewed. The patient's tolerance of                            previous anesthesia was also reviewed. The risks                            and benefits of the procedure and the sedation                            options and risks were discussed with the patient.                            All questions were answered, and informed consent                            was obtained. Prior Anticoagulants: The patient has                            taken no previous anticoagulant or antiplatelet                            agents. ASA Grade Assessment: II - A patient with                            mild systemic disease. After reviewing the risks                            and benefits, the patient was deemed in                            satisfactory condition to undergo the procedure.  After obtaining informed consent, the endoscope was                            passed under direct vision. Throughout the                            procedure, the patient's blood pressure, pulse, and                            oxygen saturations were monitored continuously. The                            Endoscope was introduced through the mouth, and                            advanced to the  second part of duodenum. The upper                            GI endoscopy was accomplished without difficulty.                            The patient tolerated the procedure well. Scope In: Scope Out: Findings:                 Esophagogastric landmarks were identified: the                            Z-line was found at 37 cm, the gastroesophageal                            junction was found at 37 cm and the upper extent of                            the gastric folds was found at 37 cm from the                            incisors.                           The exam of the esophagus was otherwise normal. No                            esophagitis or Barrett's esophagus.                           Patchy mild inflammation characterized by adherent                            heme and erythema was found in the gastric fundus                            and in the gastric body.  A few small sessile polyps were found in the                            gastric fundus and in the gastric body, 3-56mm in                            size, suspect benign fundic gland polyps. One                            representative polyp was removed with a cold biopsy                            forceps. Resection and retrieval were complete.                           The exam of the stomach was otherwise normal.                           Biopsies were taken with a cold forceps in the                            gastric body, at the incisura and in the gastric                            antrum for Helicobacter pylori testing.                           The duodenal bulb and second portion of the                            duodenum were normal. Complications:            No immediate complications. Estimated blood loss:                            Minimal. Estimated Blood Loss:     Estimated blood loss was minimal. Impression:               - Esophagogastric landmarks identified.                            - Normal esophagus - no Barrett's                           - Gastritis. Biopsies taken to rule out H pylori                           - A few gastric polyps, small, likely fundic gland                            polyps. One representative polyp resected and                            retrieved.                           -  Normal stomach otherwise                           - Normal duodenal bulb and second portion of the                            duodenum.                           Possible B12 deficiency could be related to chronic                            PPI use, will await biopsy results, Recommendation:           - Patient has a contact number available for                            emergencies. The signs and symptoms of potential                            delayed complications were discussed with the                            patient. Return to normal activities tomorrow.                            Written discharge instructions were provided to the                            patient.                           - Resume previous diet.                           - Continue present medications.                           - Await pathology results. Remo Lipps P. Jawaan Adachi, MD 09/12/2019 4:17:36 PM This report has been signed electronically.

## 2019-09-12 NOTE — Progress Notes (Signed)
Patients history reviewed. Patient vaccinated. VS:Rhonda [name]

## 2019-09-12 NOTE — Progress Notes (Signed)
1524 Robinul 0.1 mg IV given due large amount of secretions upon assessment.  MD made aware, vss  

## 2019-09-16 ENCOUNTER — Telehealth: Payer: Self-pay | Admitting: *Deleted

## 2019-09-16 NOTE — Telephone Encounter (Signed)
  Follow up Call-  Call back number 09/12/2019  Post procedure Call Back phone  # [phone]  Permission to leave phone message Yes  Some recent data might be hidden     Patient questions:  Do you have a fever, pain , or abdominal swelling? No. Pain Score  0 *  Have you tolerated food without any problems? Yes.    Have you been able to return to your normal activities? Yes.    Do you have any questions about your discharge instructions: Diet   No. Medications  No. Follow up visit  No.  Do you have questions or concerns about your Care? No.  Actions: * If pain score is 4 or above: 1. No action needed, pain <4.Have you developed a fever since your procedure? no  2.   Have you had an respiratory symptoms (SOB or cough) since your procedure? no  3.   Have you tested positive for COVID 19 since your procedure no  4.   Have you had any family members/close contacts diagnosed with the COVID 19 since your procedure?  no   If yes to any of these questions please route to Joylene John, RN and Joella Prince, RN

## 2019-09-18 ENCOUNTER — Ambulatory Visit (INDEPENDENT_AMBULATORY_CARE_PROVIDER_SITE_OTHER): Payer: 59 | Admitting: *Deleted

## 2019-09-18 ENCOUNTER — Other Ambulatory Visit: Payer: Self-pay

## 2019-09-18 DIAGNOSIS — E538 Deficiency of other specified B group vitamins: Secondary | ICD-10-CM

## 2019-09-18 MED ORDER — CYANOCOBALAMIN 1000 MCG/ML IJ SOLN
1000.0000 ug | Freq: Once | INTRAMUSCULAR | Status: AC
  Administered 2019-09-18: 1000 ug via INTRAMUSCULAR

## 2019-09-18 NOTE — Progress Notes (Signed)
Pls cosign for B12 inj../lmb  

## 2019-10-21 ENCOUNTER — Ambulatory Visit (INDEPENDENT_AMBULATORY_CARE_PROVIDER_SITE_OTHER): Payer: 59

## 2019-10-21 ENCOUNTER — Other Ambulatory Visit: Payer: Self-pay

## 2019-10-21 DIAGNOSIS — E538 Deficiency of other specified B group vitamins: Secondary | ICD-10-CM | POA: Diagnosis not present

## 2019-10-21 MED ORDER — CYANOCOBALAMIN 1000 MCG/ML IJ SOLN
1000.0000 ug | Freq: Once | INTRAMUSCULAR | Status: AC
Start: 2019-10-21 — End: 2019-10-21
  Administered 2019-10-21: 1000 ug via INTRAMUSCULAR

## 2019-10-21 NOTE — Progress Notes (Signed)
B12 given.   PCP out of office. Cosign needed.

## 2019-11-20 ENCOUNTER — Other Ambulatory Visit: Payer: Self-pay

## 2019-11-20 ENCOUNTER — Ambulatory Visit (INDEPENDENT_AMBULATORY_CARE_PROVIDER_SITE_OTHER): Payer: 59

## 2019-11-20 DIAGNOSIS — E538 Deficiency of other specified B group vitamins: Secondary | ICD-10-CM | POA: Diagnosis not present

## 2019-11-20 MED ORDER — CYANOCOBALAMIN 1000 MCG/ML IJ SOLN
1000.0000 ug | Freq: Once | INTRAMUSCULAR | Status: AC
Start: 2019-11-20 — End: 2019-11-20
  Administered 2019-11-20: 1000 ug via INTRAMUSCULAR

## 2019-11-20 NOTE — Progress Notes (Signed)
Vitamin B12 given to pt w/o any complications.

## 2019-12-08 ENCOUNTER — Emergency Department (HOSPITAL_COMMUNITY)
Admission: EM | Admit: 2019-12-08 | Discharge: 2019-12-08 | Disposition: A | Discharge status: Home / Self Care | Payer: 59 | Attending: Emergency Medicine | Admitting: Emergency Medicine

## 2019-12-08 ENCOUNTER — Other Ambulatory Visit: Payer: Self-pay

## 2019-12-08 ENCOUNTER — Encounter (HOSPITAL_COMMUNITY): Payer: Self-pay | Admitting: Emergency Medicine

## 2019-12-08 DIAGNOSIS — R109 Unspecified abdominal pain: Secondary | ICD-10-CM | POA: Diagnosis not present

## 2019-12-08 DIAGNOSIS — R112 Nausea with vomiting, unspecified: Secondary | ICD-10-CM | POA: Insufficient documentation

## 2019-12-08 DIAGNOSIS — Z20822 Contact with and (suspected) exposure to covid-19: Secondary | ICD-10-CM | POA: Diagnosis not present

## 2019-12-08 DIAGNOSIS — R197 Diarrhea, unspecified: Secondary | ICD-10-CM | POA: Insufficient documentation

## 2019-12-08 LAB — COMPREHENSIVE METABOLIC PANEL
ALT: 22 U/L (ref 0–44)
AST: 23 U/L (ref 15–41)
Albumin: 3.9 g/dL (ref 3.5–5.0)
Alkaline Phosphatase: 100 U/L (ref 38–126)
Anion gap: 12 (ref 5–15)
BUN: 14 mg/dL (ref 6–20)
CO2: 23 mmol/L (ref 22–32)
Calcium: 8.8 mg/dL — ABNORMAL LOW (ref 8.9–10.3)
Chloride: 101 mmol/L (ref 98–111)
Creatinine, Ser: 0.81 mg/dL (ref 0.44–1.00)
GFR, Estimated: >60 mL/min (ref >60)
Glucose, Bld: 131 mg/dL — ABNORMAL HIGH (ref 70–99)
Potassium: 4.2 mmol/L (ref 3.5–5.1)
Sodium: 136 mmol/L (ref 135–145)
Total Bilirubin: 1.6 mg/dL — ABNORMAL HIGH (ref 0.3–1.2)
Total Protein: 7.9 g/dL (ref 6.5–8.1)

## 2019-12-08 LAB — CBC WITH DIFFERENTIAL/PLATELET
Abs Immature Granulocytes: 0.02 10*3/uL (ref 0.00–0.07)
Basophils Absolute: 0 10*3/uL (ref 0.0–0.1)
Basophils Relative: 0 %
Eosinophils Absolute: 0 10*3/uL (ref 0.0–0.5)
Eosinophils Relative: 0 %
HCT: 43.4 % (ref 36.0–46.0)
Hemoglobin: 14.6 g/dL (ref 12.0–15.0)
Immature Granulocytes: 0 %
Lymphocytes Relative: 4 %
Lymphs Abs: 0.4 10*3/uL — ABNORMAL LOW (ref 0.7–4.0)
MCH: 30.3 pg (ref 26.0–34.0)
MCHC: 33.6 g/dL (ref 30.0–36.0)
MCV: 90 fL (ref 80.0–100.0)
Monocytes Absolute: 0.4 10*3/uL (ref 0.1–1.0)
Monocytes Relative: 4 %
Neutro Abs: 9 10*3/uL — ABNORMAL HIGH (ref 1.7–7.7)
Neutrophils Relative %: 92 %
Platelets: 412 10*3/uL — ABNORMAL HIGH (ref 150–400)
RBC: 4.82 MIL/uL (ref 3.87–5.11)
RDW: 13.5 % (ref 11.5–15.5)
WBC: 9.8 10*3/uL (ref 4.0–10.5)
nRBC: 0 % (ref 0.0–0.2)

## 2019-12-08 LAB — RESPIRATORY PANEL BY RT PCR (FLU A&B, COVID)
Influenza A by PCR: NEGATIVE
Influenza B by PCR: NEGATIVE
SARS Coronavirus 2 by RT PCR: NEGATIVE

## 2019-12-08 LAB — LIPASE, BLOOD: Lipase: 24 U/L (ref 11–51)

## 2019-12-08 MED ORDER — ONDANSETRON 4 MG PO TBDP: 4.0000 mg | ORAL_TABLET | Freq: Three times a day (TID) | ORAL | 0 refills | Status: DC | PRN

## 2019-12-08 MED ORDER — ONDANSETRON HCL 4 MG/2ML IJ SOLN
4.0000 mg | Freq: Once | INTRAMUSCULAR | Status: AC
  Administered 2019-12-08: 4 mg via INTRAVENOUS
  Filled 2019-12-08: qty 2

## 2019-12-08 MED ORDER — SODIUM CHLORIDE 0.9 % IV BOLUS
1000.0000 mL | Freq: Once | INTRAVENOUS | Status: AC
  Administered 2019-12-08: 1000 mL via INTRAVENOUS

## 2019-12-08 NOTE — Discharge Instructions (Signed)
Take the prescribed medication as directed.  Push fluids, gentle diet for now and progress as tolerated. Follow-up with your primary care doctor. Return to the ED for new or worsening symptoms.

## 2019-12-08 NOTE — ED Triage Notes (Signed)
Pt reports abdominal pain with nausea, vomiting, and diarrhea that started tonight.

## 2019-12-08 NOTE — ED Provider Notes (Signed)
[facility] DEPT Provider Note   CSN: [id number] Arrival date & time: 12/08/19  0114     History Chief Complaint  Patient presents with  . Abdominal Pain  . Emesis    Rhonda [name] is a 49 y.o. female.  The history is provided by the patient and medical records.  Abdominal Pain Associated symptoms: diarrhea, nausea and vomiting   Emesis Associated symptoms: diarrhea     49 y.o. F with hx of seasonal allergies, presenting to the ED for N/V/D that began this morning.  States emesis and diarrhea x4 today, not able to eat/drink.  Denies sick contacts, international travel, or abnormal food intake.  Spiked fever this evening and was concerned.  Has been vaccinated for covid but expressed concern for possible infection.  No meds PTA.  Past Medical History:  Diagnosis Date  . Allergic rhinitis   . Allergy   . B12 deficiency   . Carpal tunnel syndrome   . Dysfunction of eustachian tube     Patient Active Problem List   Diagnosis Date Noted  . Eustachian tube dysfunction, bilateral 08/02/2019  . Leg swelling 08/02/2019  . Other chest pain 07/30/2019  . Screen for colon cancer 07/30/2019  . Psychophysiological insomnia 07/30/2019  . Rash and nonspecific skin eruption 04/15/2019  . Cough 08/29/2018  . Chiari malformation type II ([place]) 09/27/2016  . Abnormal MRI of head 03/07/2016  . Gastroesophageal reflux disease without esophagitis 08/19/2015  . IBS (irritable bowel syndrome) 08/19/2015  . Routine general medical examination at a health care facility 07/14/2014  . Vitamin B 12 deficiency 12/08/2008  . Acute sinusitis 09/10/2007  . CARPAL TUNNEL SYNDROME 05/14/2007  . DE QUERVAIN'S TENOSYNOVITIS 05/14/2007  . Allergic rhinitis 01/31/2007    Past Surgical History:  Procedure Laterality Date  . BUNIONECTOMY     right   . CHOLECYSTECTOMY N/A 09/25/2015   Procedure: LAPAROSCOPIC CHOLECYSTECTOMY WITH INTRAOPERATIVE CHOLANGIOGRAM;  Surgeon:  Alphonsa Overall, MD;  Location: [facility];  Service: General;  Laterality: N/A;  . TONSILLECTOMY       OB History   No obstetric history on file.     Family History  Problem Relation Age of Onset  . Heart disease Father   . Hypertension Father   . Diabetes Father   . Hyperlipidemia Mother   . Hypertension Mother   . Heart disease Maternal Grandmother   . Diabetes Brother   . Headache Neg Hx   . Colon cancer Neg Hx   . Prostate cancer Neg Hx   . Rectal cancer Neg Hx   . Stomach cancer Neg Hx   . Esophageal cancer Neg Hx     Social History   Tobacco Use  . Smoking status: Never Smoker  . Smokeless tobacco: Never Used  Vaping Use  . Vaping Use: Never used  Substance Use Topics  . Alcohol use: No  . Drug use: No    Home Medications Prior to Admission medications   Medication Sig Start Date End Date Taking? Authorizing Provider  acetaminophen (TYLENOL) 325 MG tablet Take 325 mg by mouth every 4 (four) hours as needed for fever. Patient not taking: Reported on 09/12/2019    [provider]  esomeprazole (NEXIUM) 20 MG capsule Take 20 mg by mouth daily at 12 noon.    [provider]  Lemborexant (DAYVIGO) 5 MG TABS Take 1 tablet by mouth at bedtime as needed. Patient not taking: Reported on 09/12/2019 07/30/19   Janith Lima, MD  Multiple Vitamin (MULTIVITAMIN) tablet Take 1 tablet by mouth daily. Patient not taking: Reported on 09/12/2019    [provider]    Allergies    Bactrim ds [sulfamethoxazole-trimethoprim], Cephalosporins, Oseltamivir phosphate, Amoxicillin, Doxycycline, and Fluconazole  Review of Systems   Review of Systems  Gastrointestinal: Positive for diarrhea, nausea and vomiting.  All other systems reviewed and are negative.   Physical Exam Updated Vital Signs BP (!) 134/91 (BP Location: Left Arm)   Pulse (!) 123   Temp 99.8 F (37.7 C) (Oral)   Resp 18   SpO2 98%   Physical Exam Vitals and nursing note reviewed.    Constitutional:      Appearance: She is well-developed.  HENT:     Head: Normocephalic and atraumatic.  Eyes:     Conjunctiva/sclera: Conjunctivae normal.     Pupils: Pupils are equal, round, and reactive to light.  Cardiovascular:     Rate and Rhythm: Normal rate and regular rhythm.     Heart sounds: Normal heart sounds.  Pulmonary:     Effort: Pulmonary effort is normal.     Breath sounds: Normal breath sounds.  Abdominal:     General: Bowel sounds are normal.     Palpations: Abdomen is soft.     Tenderness: There is no abdominal tenderness. There is no guarding or rebound.     Comments: Soft, non-tender  Musculoskeletal:        General: Normal range of motion.     Cervical back: Normal range of motion.  Skin:    General: Skin is warm and dry.  Neurological:     Mental Status: She is alert and oriented to person, place, and time.     ED Results / Procedures / Treatments   Labs (all labs ordered are listed, but only abnormal results are displayed) Labs Reviewed  CBC WITH DIFFERENTIAL/PLATELET - Abnormal; Notable for the following components:      Result Value   Platelets 412 (*)    Neutro Abs 9.0 (*)    Lymphs Abs 0.4 (*)    All other components within normal limits  COMPREHENSIVE METABOLIC PANEL - Abnormal; Notable for the following components:   Glucose, Bld 131 (*)    Calcium 8.8 (*)    Total Bilirubin 1.6 (*)    All other components within normal limits  RESPIRATORY PANEL BY RT PCR (FLU A&B, COVID)  LIPASE, BLOOD    EKG None  Radiology No results found.  Procedures Procedures (including critical care time)  Medications Ordered in ED Medications  sodium chloride 0.9 % bolus 1,000 mL (0 mLs Intravenous Stopped 12/08/19 0300)  ondansetron (ZOFRAN) injection 4 mg (4 mg Intravenous Given 12/08/19 0155)    ED Course  I have reviewed the triage vital signs and the nursing notes.  Pertinent labs & imaging results that were available during my care of  the patient were reviewed by me and considered in my medical decision making (see chart for details).    MDM Rules/Calculators/A&P  49 year old female presenting to the ED with nausea, vomiting, diarrhea that began today.  She is afebrile and nontoxic in appearance here.  Abdomen is soft and nontender.  Screening labs were obtained and are overall reassuring without leukocytosis or electrolyte derangement.  She was given IV fluids and Zofran with vast improvement of symptoms.  She is tolerating 2 cups of water without recurrent emesis.  She has not had any diarrheal episodes while here in the ED.  She requested Covid  screening which is negative.  Suspect other viral process.  Plan to discharge home with continued symptomatic care, Rx Zofran.  Encouraged good oral hydration, gentle diet for now.  Close follow-up with PCP.  Return here for any new or acute changes.  Final Clinical Impression(s) / ED Diagnoses Final diagnoses:  Nausea vomiting and diarrhea    Rx / DC Orders ED Discharge Orders         Ordered    ondansetron (ZOFRAN ODT) 4 MG disintegrating tablet  Every 8 hours PRN        12/08/19 0422           Larene Pickett, PA-C 12/08/19 [place], MD 12/08/19 [phone]

## 2019-12-23 ENCOUNTER — Telehealth: Payer: Self-pay

## 2019-12-23 ENCOUNTER — Ambulatory Visit: Payer: 59

## 2019-12-23 NOTE — Telephone Encounter (Signed)
LVM to cancel  b12 appt b/c pt has not been seen by provider for annual since 11/2018; needs to schedule OV with Dr Ronnald Ramp.

## 2019-12-26 ENCOUNTER — Ambulatory Visit: Payer: 59 | Admitting: Internal Medicine

## 2019-12-26 ENCOUNTER — Other Ambulatory Visit: Payer: Self-pay

## 2019-12-26 ENCOUNTER — Encounter: Payer: Self-pay | Admitting: Internal Medicine

## 2019-12-26 VITALS — BP 118/78 | HR 78 | Temp 98.4°F | Resp 16 | Ht 65.0 in | Wt 208.0 lb

## 2019-12-26 DIAGNOSIS — D75839 Thrombocytosis, unspecified: Secondary | ICD-10-CM | POA: Diagnosis not present

## 2019-12-26 DIAGNOSIS — R0782 Intercostal pain: Secondary | ICD-10-CM

## 2019-12-26 DIAGNOSIS — R0789 Other chest pain: Secondary | ICD-10-CM

## 2019-12-26 DIAGNOSIS — R61 Generalized hyperhidrosis: Secondary | ICD-10-CM | POA: Diagnosis not present

## 2019-12-26 DIAGNOSIS — R739 Hyperglycemia, unspecified: Secondary | ICD-10-CM | POA: Insufficient documentation

## 2019-12-26 LAB — CBC WITH DIFFERENTIAL/PLATELET
Basophils Absolute: 0 10*3/uL (ref 0.0–0.1)
Basophils Relative: 0.7 % (ref 0.0–3.0)
Eosinophils Absolute: 0.1 10*3/uL (ref 0.0–0.7)
Eosinophils Relative: 0.7 % (ref 0.0–5.0)
HCT: 41.5 % (ref 36.0–46.0)
Hemoglobin: 14 g/dL (ref 12.0–15.0)
Lymphocytes Relative: 26 % (ref 12.0–46.0)
Lymphs Abs: 1.7 10*3/uL (ref 0.7–4.0)
MCHC: 33.6 g/dL (ref 30.0–36.0)
MCV: 89.1 fl (ref 78.0–100.0)
Monocytes Absolute: 0.6 10*3/uL (ref 0.1–1.0)
Monocytes Relative: 8.6 % (ref 3.0–12.0)
Neutro Abs: 4.3 10*3/uL (ref 1.4–7.7)
Neutrophils Relative %: 64 % (ref 43.0–77.0)
Platelets: 468 10*3/uL — ABNORMAL HIGH (ref 150.0–400.0)
RBC: 4.65 Mil/uL (ref 3.87–5.11)
RDW: 14.2 % (ref 11.5–15.5)
WBC: 6.7 10*3/uL (ref 4.0–10.5)

## 2019-12-26 LAB — FERRITIN: Ferritin: 50.5 ng/mL (ref 10.0–291.0)

## 2019-12-26 LAB — IRON: Iron: 102 ug/dL (ref 42–145)

## 2019-12-26 LAB — HEMOGLOBIN A1C: Hgb A1c MFr Bld: 5.4 % (ref 4.6–6.5)

## 2019-12-26 NOTE — Patient Instructions (Signed)

## 2019-12-26 NOTE — Progress Notes (Signed)
Subjective:  Patient ID: Rhonda [name], female    DOB: Oct 01, 1970  Age: 49 y.o. MRN: [id number]  CC: Cough  This visit occurred during the SARS-CoV-2 public health emergency.  Safety protocols were in place, including screening questions prior to the visit, additional usage of staff PPE, and extensive cleaning of exam room while observing appropriate contact time as indicated for disinfecting solutions.    HPI Rhonda [name] presents for f/up - She continues to complain of nonproductive cough and pain under her distal sternum.  She describes the pain as a dull ache.  She has also had mild night sweats but she denies productive cough, hemoptysis, fever, or chills.  She is not losing weight.  Her chest x-ray about 5 months ago was normal.  She denies wheezing, diaphoresis, dyspnea on exertion, rash, or lymphadenopathy.  Outpatient Medications Prior to Visit  Medication Sig Dispense Refill  . acetaminophen (TYLENOL) 325 MG tablet Take 325 mg by mouth every 4 (four) hours as needed for fever.     . esomeprazole (NEXIUM) 20 MG capsule Take 20 mg by mouth daily at 12 noon.    . Lemborexant (DAYVIGO) 5 MG TABS Take 1 tablet by mouth at bedtime as needed. 30 tablet 1  . ondansetron (ZOFRAN ODT) 4 MG disintegrating tablet Take 1 tablet (4 mg total) by mouth every 8 (eight) hours as needed for nausea. 10 tablet 0   No facility-administered medications prior to visit.    ROS Review of Systems  Constitutional: Negative.  Negative for appetite change, chills, diaphoresis, fatigue, fever and unexpected weight change.  HENT: Negative.  Negative for sinus pressure, sore throat, trouble swallowing and voice change.   Respiratory: Positive for cough. Negative for choking, chest tightness, shortness of breath and wheezing.   Cardiovascular: Positive for chest pain. Negative for palpitations and leg swelling.  Gastrointestinal: Negative for abdominal pain, constipation, diarrhea, nausea and vomiting.    Genitourinary: Negative.  Negative for difficulty urinating and dysuria.  Musculoskeletal: Negative.  Negative for arthralgias and myalgias.  Skin: Negative.  Negative for color change and pallor.  Neurological: Negative.  Negative for dizziness, weakness, light-headedness and headaches.  Hematological: Negative for adenopathy. Does not bruise/bleed easily.  Psychiatric/Behavioral: Negative.     Objective:  BP 118/78   Pulse 78   Temp 98.4 F (36.9 C) (Oral)   Resp 16   Ht 5\' 5"  (1.651 m)   Wt 208 lb (94.3 kg)   SpO2 97%   BMI 34.61 kg/m   BP Readings from Last 3 Encounters:  12/26/19 118/78  12/08/19 (!) 109/91  09/12/19 113/82    Wt Readings from Last 3 Encounters:  12/26/19 208 lb (94.3 kg)  09/12/19 210 lb (95.3 kg)  08/14/19 210 lb (95.3 kg)    Physical Exam Vitals reviewed.  HENT:     Nose: Nose normal.     Mouth/Throat:     Mouth: Mucous membranes are moist.  Eyes:     General: No scleral icterus.    Conjunctiva/sclera: Conjunctivae normal.  Cardiovascular:     Rate and Rhythm: Normal rate and regular rhythm.     Heart sounds: No murmur heard.   Pulmonary:     Effort: Pulmonary effort is normal.     Breath sounds: No stridor. No wheezing, rhonchi or rales.  Abdominal:     General: Abdomen is flat.     Palpations: There is no mass.     Tenderness: There is no abdominal tenderness. There is  no guarding.  Musculoskeletal:        General: Normal range of motion.     Cervical back: Neck supple.     Right lower leg: No edema.     Left lower leg: No edema.  Lymphadenopathy:     Cervical: No cervical adenopathy.  Skin:    General: Skin is warm and dry.     Findings: No rash.  Neurological:     General: No focal deficit present.     Mental Status: She is alert.  Psychiatric:        Mood and Affect: Mood normal.        Behavior: Behavior normal.     Lab Results  Component Value Date   WBC 6.7 12/26/2019   HGB 14.0 12/26/2019   HCT 41.5  12/26/2019   PLT 468.0 (H) 12/26/2019   GLUCOSE 131 (H) 12/08/2019   CHOL 173 11/27/2018   TRIG 99.0 11/27/2018   HDL 56.30 11/27/2018   LDLCALC 97 11/27/2018   ALT 22 12/08/2019   AST 23 12/08/2019   NA 136 12/08/2019   K 4.2 12/08/2019   CL 101 12/08/2019   CREATININE 0.81 12/08/2019   BUN 14 12/08/2019   CO2 23 12/08/2019   TSH 0.49 01/18/2018   HGBA1C 5.4 12/26/2019    No results found.  Assessment & Plan:   Rhonda [name] was seen today for cough.  Diagnoses and all orders for this visit:  Other chest pain- She has a persistent discomfort under her distal sternum.  Her platelet count is rising.  She has a some other concerning symptoms.  Her chest x-ray was normal.  I recommended that she undergo a CT scan of the chest with contrast to screen for occult mass, malignancy, or infection. -     CT Chest W Contrast; Future  Chronic night sweats- See above. -     CBC with Differential/Platelet; Future -     Protein electrophoresis, serum; Future -     CT Chest W Contrast; Future -     Protein electrophoresis, serum -     CBC with Differential/Platelet  Hyperglycemia- Her A1c is normal. -     Hemoglobin A1c; Future -     Hemoglobin A1c  Thrombocytosis- See above. -     CBC with Differential/Platelet; Future -     Iron; Future -     Protein electrophoresis, serum; Future -     Ferritin; Future -     CT Chest W Contrast; Future -     Ferritin -     Protein electrophoresis, serum -     Iron -     CBC with Differential/Platelet  Intercostal pain -     CT Chest W Contrast; Future   I have discontinued Samuel [place] [employer] and ondansetron. I am also having her maintain her acetaminophen and esomeprazole.  No orders of the defined types were placed in this encounter.    Follow-up: Return in about 3 months (around 03/25/2020).  Scarlette Calico, MD

## 2019-12-30 LAB — PROTEIN ELECTROPHORESIS, SERUM
Albumin ELP: 3.9 g/dL (ref 3.8–4.8)
Alpha 1: 0.4 g/dL — ABNORMAL HIGH (ref 0.2–0.3)
Alpha 2: 0.8 g/dL (ref 0.5–0.9)
Beta 2: 0.4 g/dL (ref 0.2–0.5)
Beta Globulin: 0.5 g/dL (ref 0.4–0.6)
Gamma Globulin: 1.3 g/dL (ref 0.8–1.7)
Total Protein: 7.4 g/dL (ref 6.1–8.1)

## 2020-01-01 ENCOUNTER — Encounter: Payer: Self-pay | Admitting: Internal Medicine

## 2020-01-02 ENCOUNTER — Other Ambulatory Visit: Payer: Self-pay

## 2020-01-02 ENCOUNTER — Ambulatory Visit (INDEPENDENT_AMBULATORY_CARE_PROVIDER_SITE_OTHER)
Admission: RE | Admit: 2020-01-02 | Discharge: 2020-01-02 | Disposition: A | Discharge status: Home / Self Care | Payer: 59 | Source: Ambulatory Visit | Attending: Internal Medicine | Admitting: Internal Medicine

## 2020-01-02 DIAGNOSIS — R0782 Intercostal pain: Secondary | ICD-10-CM | POA: Diagnosis not present

## 2020-01-02 DIAGNOSIS — R0789 Other chest pain: Secondary | ICD-10-CM | POA: Diagnosis not present

## 2020-01-02 DIAGNOSIS — R61 Generalized hyperhidrosis: Secondary | ICD-10-CM

## 2020-01-02 DIAGNOSIS — D75839 Thrombocytosis, unspecified: Secondary | ICD-10-CM

## 2020-01-02 MED ORDER — IOHEXOL 300 MG/ML  SOLN
80.0000 mL | Freq: Once | INTRAMUSCULAR | Status: AC | PRN
  Administered 2020-01-02: 80 mL via INTRAVENOUS

## 2020-01-27 ENCOUNTER — Ambulatory Visit: Payer: 59 | Attending: Internal Medicine

## 2020-01-27 DIAGNOSIS — Z23 Encounter for immunization: Secondary | ICD-10-CM

## 2020-01-27 NOTE — Progress Notes (Signed)
   Covid-19 Vaccination Clinic  Name:  Rhonda [name]    MRN: [id number] DOB: April 08, 1970  01/27/2020  [name] was observed post Covid-19 immunization for 15 minutes without incident. She was provided with Vaccine Information Sheet and instruction to access the V-Safe system.   Ms. Eager was instructed to call 911 with any severe reactions post vaccine: Marland Kitchen Difficulty breathing  . Swelling of face and throat  . A fast heartbeat  . A bad rash all over body  . Dizziness and weakness   Immunizations Administered    Name Date Dose VIS Date Route   Pfizer COVID-19 Vaccine 01/27/2020  1:32 PM 0.3 mL 11/13/2019 Intramuscular   Manufacturer: [employer], [employer]   Lot: [id number]   NDC: [address]-9

## 2020-03-10 ENCOUNTER — Telehealth (INDEPENDENT_AMBULATORY_CARE_PROVIDER_SITE_OTHER): Payer: 59 | Admitting: Family

## 2020-03-10 ENCOUNTER — Other Ambulatory Visit: Payer: Self-pay

## 2020-03-10 DIAGNOSIS — F418 Other specified anxiety disorders: Secondary | ICD-10-CM | POA: Diagnosis not present

## 2020-03-10 DIAGNOSIS — F4321 Adjustment disorder with depressed mood: Secondary | ICD-10-CM | POA: Diagnosis not present

## 2020-03-10 MED ORDER — ALPRAZOLAM 0.5 MG PO TABS
ORAL_TABLET | ORAL | 0 refills | Status: DC
Start: 2020-03-10 — End: 2021-02-08

## 2020-03-10 NOTE — Progress Notes (Signed)
Rhonda [name] is a 50 y.o. female with the following history as recorded in EpicCare:  Patient Active Problem List   Diagnosis Date Noted  . Chronic night sweats 12/26/2019  . Hyperglycemia 12/26/2019  . Other chest pain 07/30/2019  . Screen for colon cancer 07/30/2019  . Psychophysiological insomnia 07/30/2019  . Cough 08/29/2018  . Thrombocytosis 11/06/2017  . Chiari malformation type II ([place]) 09/27/2016  . Abnormal MRI of head 03/07/2016  . Gastroesophageal reflux disease without esophagitis 08/19/2015  . IBS (irritable bowel syndrome) 08/19/2015  . Routine general medical examination at a health care facility 07/14/2014  . Vitamin B 12 deficiency 12/08/2008  . CARPAL TUNNEL SYNDROME 05/14/2007  . DE QUERVAIN'S TENOSYNOVITIS 05/14/2007  . Allergic rhinitis 01/31/2007    Current Outpatient Medications  Medication Sig Dispense Refill  . ALPRAZolam (XANAX) 0.5 MG tablet 1/2-1 tablet po bid prn anxiety 30 tablet 0  . acetaminophen (TYLENOL) 325 MG tablet Take 325 mg by mouth every 4 (four) hours as needed for fever.     . esomeprazole (NEXIUM) 20 MG capsule Take 20 mg by mouth daily at 12 noon.     No current facility-administered medications for this visit.    Allergies: Bactrim ds [sulfamethoxazole-trimethoprim], Cephalosporins, Oseltamivir phosphate, Amoxicillin, Doxycycline, and Fluconazole  Past Medical History:  Diagnosis Date  . Allergic rhinitis   . Allergy   . B12 deficiency   . Carpal tunnel syndrome   . Dysfunction of eustachian tube     Past Surgical History:  Procedure Laterality Date  . BUNIONECTOMY     right   . CHOLECYSTECTOMY N/A 09/25/2015   Procedure: LAPAROSCOPIC CHOLECYSTECTOMY WITH INTRAOPERATIVE CHOLANGIOGRAM;  Surgeon: Alphonsa Overall, MD;  Location: [facility];  Service: General;  Laterality: N/A;  . TONSILLECTOMY      Family History  Problem Relation Age of Onset  . Heart disease Father   . Hypertension Father   . Diabetes Father   .  Hyperlipidemia Mother   . Hypertension Mother   . Heart disease Maternal Grandmother   . Diabetes Brother   . Headache Neg Hx   . Colon cancer Neg Hx   . Prostate cancer Neg Hx   . Rectal cancer Neg Hx   . Stomach cancer Neg Hx   . Esophageal cancer Neg Hx     Social History   Tobacco Use  . Smoking status: Never Smoker  . Smokeless tobacco: Never Used  Substance Use Topics  . Alcohol use: No    Subjective:    I connected with [name] on 03/10/20 at  9:20 AM EST by a telephone call and verified that I am speaking with the correct person using two identifiers.   I discussed the limitations of evaluation and management by telemedicine and the availability of in person appointments. The patient expressed understanding and agreed to proceed. Provider in office/ patient is at home; provider and patient are only 2 people on telephone call.   Patient's father passed away unexpectedly from complications from [place] on 03/06/20; patient is understandably upset and overwhelmed; asking for something "to help calm my nerves." Does have good family support including her mother and 2 brothers; work is being supportive- able to take off the time she needs right now; not sleeping well;    Objective:  There were no vitals filed for this visit.   Lungs: Respirations unlabored;  Neurologic: Alert and oriented; speech intact; tearful during phone call;   Assessment:  1. Grief   2.  Situational anxiety     Plan:  Patient shocked and overwhelmed with sudden loss of father; will give short term trial of Xanax 0.5 mg bid prn; she will plan to see her PCP in office if she feels that she needs more long term treatment options or to consider grief counseling; she has good family and job support and understands to call if there is anything we can do to help her in the interim.  Time spent 10 minutes  No follow-ups on file.  No orders of the defined types were placed in this encounter.    Requested Prescriptions   Signed Prescriptions Disp Refills  . ALPRAZolam (XANAX) 0.5 MG tablet 30 tablet 0    Sig: 1/2-1 tablet po bid prn anxiety

## 2020-04-01 ENCOUNTER — Other Ambulatory Visit: Payer: Self-pay

## 2020-04-03 ENCOUNTER — Ambulatory Visit: Payer: 59 | Admitting: Family

## 2020-04-03 ENCOUNTER — Other Ambulatory Visit: Payer: Self-pay

## 2020-04-03 ENCOUNTER — Encounter: Payer: Self-pay | Admitting: Family

## 2020-04-03 ENCOUNTER — Other Ambulatory Visit: Payer: Self-pay | Admitting: Family

## 2020-04-03 VITALS — BP 120/80 | HR 83 | Temp 98.1°F | Ht 65.0 in | Wt 204.0 lb

## 2020-04-03 DIAGNOSIS — E538 Deficiency of other specified B group vitamins: Secondary | ICD-10-CM

## 2020-04-03 DIAGNOSIS — R7309 Other abnormal glucose: Secondary | ICD-10-CM

## 2020-04-03 DIAGNOSIS — D75839 Thrombocytosis, unspecified: Secondary | ICD-10-CM

## 2020-04-03 DIAGNOSIS — R5383 Other fatigue: Secondary | ICD-10-CM

## 2020-04-03 LAB — COMPREHENSIVE METABOLIC PANEL
ALT: 12 U/L (ref 0–35)
AST: 17 U/L (ref 0–37)
Albumin: 3.9 g/dL (ref 3.5–5.2)
Alkaline Phosphatase: 92 U/L (ref 39–117)
BUN: 6 mg/dL (ref 6–23)
CO2: 30 mEq/L (ref 19–32)
Calcium: 9.6 mg/dL (ref 8.4–10.5)
Chloride: 102 mEq/L (ref 96–112)
Creatinine, Ser: 0.81 mg/dL (ref 0.40–1.20)
GFR: 84.57 mL/min (ref >60.00)
Glucose, Bld: 91 mg/dL (ref 70–99)
Potassium: 4.1 mEq/L (ref 3.5–5.1)
Sodium: 139 mEq/L (ref 135–145)
Total Bilirubin: 1.2 mg/dL (ref 0.2–1.2)
Total Protein: 7.5 g/dL (ref 6.0–8.3)

## 2020-04-03 LAB — CBC WITH DIFFERENTIAL/PLATELET
Basophils Absolute: 0 10*3/uL (ref 0.0–0.1)
Basophils Relative: 0.5 % (ref 0.0–3.0)
Eosinophils Absolute: 0.1 10*3/uL (ref 0.0–0.7)
Eosinophils Relative: 0.8 % (ref 0.0–5.0)
HCT: 38.1 % (ref 36.0–46.0)
Hemoglobin: 13.2 g/dL (ref 12.0–15.0)
Lymphocytes Relative: 33.3 % (ref 12.0–46.0)
Lymphs Abs: 2.4 10*3/uL (ref 0.7–4.0)
MCHC: 34.6 g/dL (ref 30.0–36.0)
MCV: 87.9 fl (ref 78.0–100.0)
Monocytes Absolute: 0.5 10*3/uL (ref 0.1–1.0)
Monocytes Relative: 6.7 % (ref 3.0–12.0)
Neutro Abs: 4.2 10*3/uL (ref 1.4–7.7)
Neutrophils Relative %: 58.7 % (ref 43.0–77.0)
Platelets: 419 10*3/uL — ABNORMAL HIGH (ref 150.0–400.0)
RBC: 4.34 Mil/uL (ref 3.87–5.11)
RDW: 14 % (ref 11.5–15.5)
WBC: 7.2 10*3/uL (ref 4.0–10.5)

## 2020-04-03 LAB — TSH: TSH: 0.62 u[IU]/mL (ref 0.35–4.50)

## 2020-04-03 LAB — HEMOGLOBIN A1C: Hgb A1c MFr Bld: 5.4 % (ref 4.6–6.5)

## 2020-04-03 LAB — VITAMIN B12: Vitamin B-12: 536 pg/mL (ref 211–911)

## 2020-04-03 MED ORDER — CYANOCOBALAMIN 1000 MCG/ML IJ SOLN
1000.0000 ug | Freq: Once | INTRAMUSCULAR | Status: AC
  Administered 2020-04-03: 1000 ug via INTRAMUSCULAR

## 2020-04-03 NOTE — Progress Notes (Signed)
Rhonda [name] is a 50 y.o. female with the following history as recorded in EpicCare:  Patient Active Problem List   Diagnosis Date Noted  . Chronic night sweats 12/26/2019  . Hyperglycemia 12/26/2019  . Other chest pain 07/30/2019  . Screen for colon cancer 07/30/2019  . Psychophysiological insomnia 07/30/2019  . Cough 08/29/2018  . Thrombocytosis 11/06/2017  . Chiari malformation type II ([place]) 09/27/2016  . Abnormal MRI of head 03/07/2016  . Gastroesophageal reflux disease without esophagitis 08/19/2015  . IBS (irritable bowel syndrome) 08/19/2015  . Routine general medical examination at a health care facility 07/14/2014  . Vitamin B 12 deficiency 12/08/2008  . CARPAL TUNNEL SYNDROME 05/14/2007  . DE QUERVAIN'S TENOSYNOVITIS 05/14/2007  . Allergic rhinitis 01/31/2007    Current Outpatient Medications  Medication Sig Dispense Refill  . acetaminophen (TYLENOL) 325 MG tablet Take 325 mg by mouth every 4 (four) hours as needed for fever.     . ALPRAZolam (XANAX) 0.5 MG tablet 1/2-1 tablet po bid prn anxiety 30 tablet 0  . esomeprazole (NEXIUM) 20 MG capsule Take 20 mg by mouth daily at 12 noon.     No current facility-administered medications for this visit.    Allergies: Bactrim ds [sulfamethoxazole-trimethoprim], Cephalosporins, Oseltamivir phosphate, Amoxicillin, Doxycycline, and Fluconazole  Past Medical History:  Diagnosis Date  . Allergic rhinitis   . Allergy   . B12 deficiency   . Carpal tunnel syndrome   . Dysfunction of eustachian tube     Past Surgical History:  Procedure Laterality Date  . BUNIONECTOMY     right   . CHOLECYSTECTOMY N/A 09/25/2015   Procedure: LAPAROSCOPIC CHOLECYSTECTOMY WITH INTRAOPERATIVE CHOLANGIOGRAM;  Surgeon: Alphonsa Overall, MD;  Location: [facility];  Service: General;  Laterality: N/A;  . TONSILLECTOMY      Family History  Problem Relation Age of Onset  . Heart disease Father   . Hypertension Father   . Diabetes Father   .  Hyperlipidemia Mother   . Hypertension Mother   . Heart disease Maternal Grandmother   . Diabetes Brother   . Headache Neg Hx   . Colon cancer Neg Hx   . Prostate cancer Neg Hx   . Rectal cancer Neg Hx   . Stomach cancer Neg Hx   . Esophageal cancer Neg Hx     Social History   Tobacco Use  . Smoking status: Never Smoker  . Smokeless tobacco: Never Used  Substance Use Topics  . Alcohol use: No    Subjective:  Patient has been having increased fatigue- known B12 deficiency and asking for B12 shot today; last shot was given in October 2021- had been getting monthly; admits that personal stress is contributing as well- lost her father one month ago today; planning to start EAP through her employer; not resting well; also needed to have her platelets re-checked today per PCP discussion in 12/2019;   Objective:  Vitals:   04/03/20 1032 04/03/20 1150  BP: 120/80   Pulse: 83   Temp: 98.1 F (36.7 C)   TempSrc: Oral   SpO2: 98%   Weight: 206 lb (93.4 kg) 204 lb (92.5 kg)  Height: _0  (1.651 m)     General: Well developed, well nourished, in no acute distress  Skin : Warm and dry.  Head: Normocephalic and atraumatic  Eyes: Sclera and conjunctiva clear; pupils round and reactive to light; extraocular movements intact  Lungs: Respirations unlabored; clear to auscultation bilaterally without wheeze, rales, rhonchi  CVS exam: normal  rate and regular rhythm.  Neurologic: Alert and oriented; speech intact; face symmetrical; moves all extremities well; CNII-XII intact without focal deficit   Assessment:  1. Low vitamin B12 level   2. Elevated glucose   3. Other fatigue   4. Thrombocytosis     Plan:  1. Check B12 level today; due to know B12 deficiency, will go ahead and give B12 shot today; 2. Check hgba1c today; 3. Suspect multifactorial;  Work note given for today; encouraged to start counseling and she agrees; not wanting daily medication at this time; follow-up to be  determined;  4. Re-check CBC today; to consider hematology referral if elevated again; she will also plan to see her PCP in follow up.   Time spent 30 minutes discussing concerns/ managing and coordinating care today  This visit occurred during the SARS-CoV-2 public health emergency.  Safety protocols were in place, including screening questions prior to the visit, additional usage of staff PPE, and extensive cleaning of exam room while observing appropriate contact time as indicated for disinfecting solutions.     No follow-ups on file.  Orders Placed This Encounter  Procedures  . B12    Standing Status:   Future    Number of Occurrences:   1    Standing Expiration Date:   04/03/2021  . Hemoglobin A1c    Standing Status:   Future    Number of Occurrences:   1    Standing Expiration Date:   04/03/2021  . CBC with Differential/Platelet    Standing Status:   Future    Number of Occurrences:   1    Standing Expiration Date:   04/03/2021  . Comp Met (CMET)    Standing Status:   Future    Number of Occurrences:   1    Standing Expiration Date:   04/03/2021  . TSH    Standing Status:   Future    Number of Occurrences:   1    Standing Expiration Date:   04/03/2021    Requested Prescriptions    No prescriptions requested or ordered in this encounter

## 2020-06-02 ENCOUNTER — Other Ambulatory Visit: Payer: Self-pay

## 2020-06-02 ENCOUNTER — Ambulatory Visit: Payer: 59 | Attending: Internal Medicine

## 2020-06-02 ENCOUNTER — Other Ambulatory Visit (HOSPITAL_BASED_OUTPATIENT_CLINIC_OR_DEPARTMENT_OTHER): Payer: Self-pay

## 2020-06-02 DIAGNOSIS — Z23 Encounter for immunization: Secondary | ICD-10-CM

## 2020-06-02 NOTE — Progress Notes (Signed)
   Covid-19 Vaccination Clinic  Name:  LAKIESHA RALPHS    MRN: [id number] DOB: 03/28/1970  06/02/2020  Ms. Domek was observed post Covid-19 immunization for 15 minutes without incident. She was provided with Vaccine Information Sheet and instruction to access the V-Safe system.   Ms. Belmonte was instructed to call 911 with any severe reactions post vaccine: Marland Kitchen Difficulty breathing  . Swelling of face and throat  . A fast heartbeat  . A bad rash all over body  . Dizziness and weakness   Immunizations Administered    Name Date Dose VIS Date Route   PFIZER Comrnaty(Gray TOP) Covid-19 Vaccine 06/02/2020  3:55 PM 0.3 mL 01/02/2020 Intramuscular   Manufacturer: [employer]   Lot: [id number]   NDC: [phone]

## 2020-09-19 IMAGING — DX CHEST - 2 VIEW
2 series · 2 of 2 positions shown · non-contrast
Comparison: No recent prior.

CLINICAL DATA: Ear ache.

EXAM:
CHEST - 2 VIEW

[chest pa]
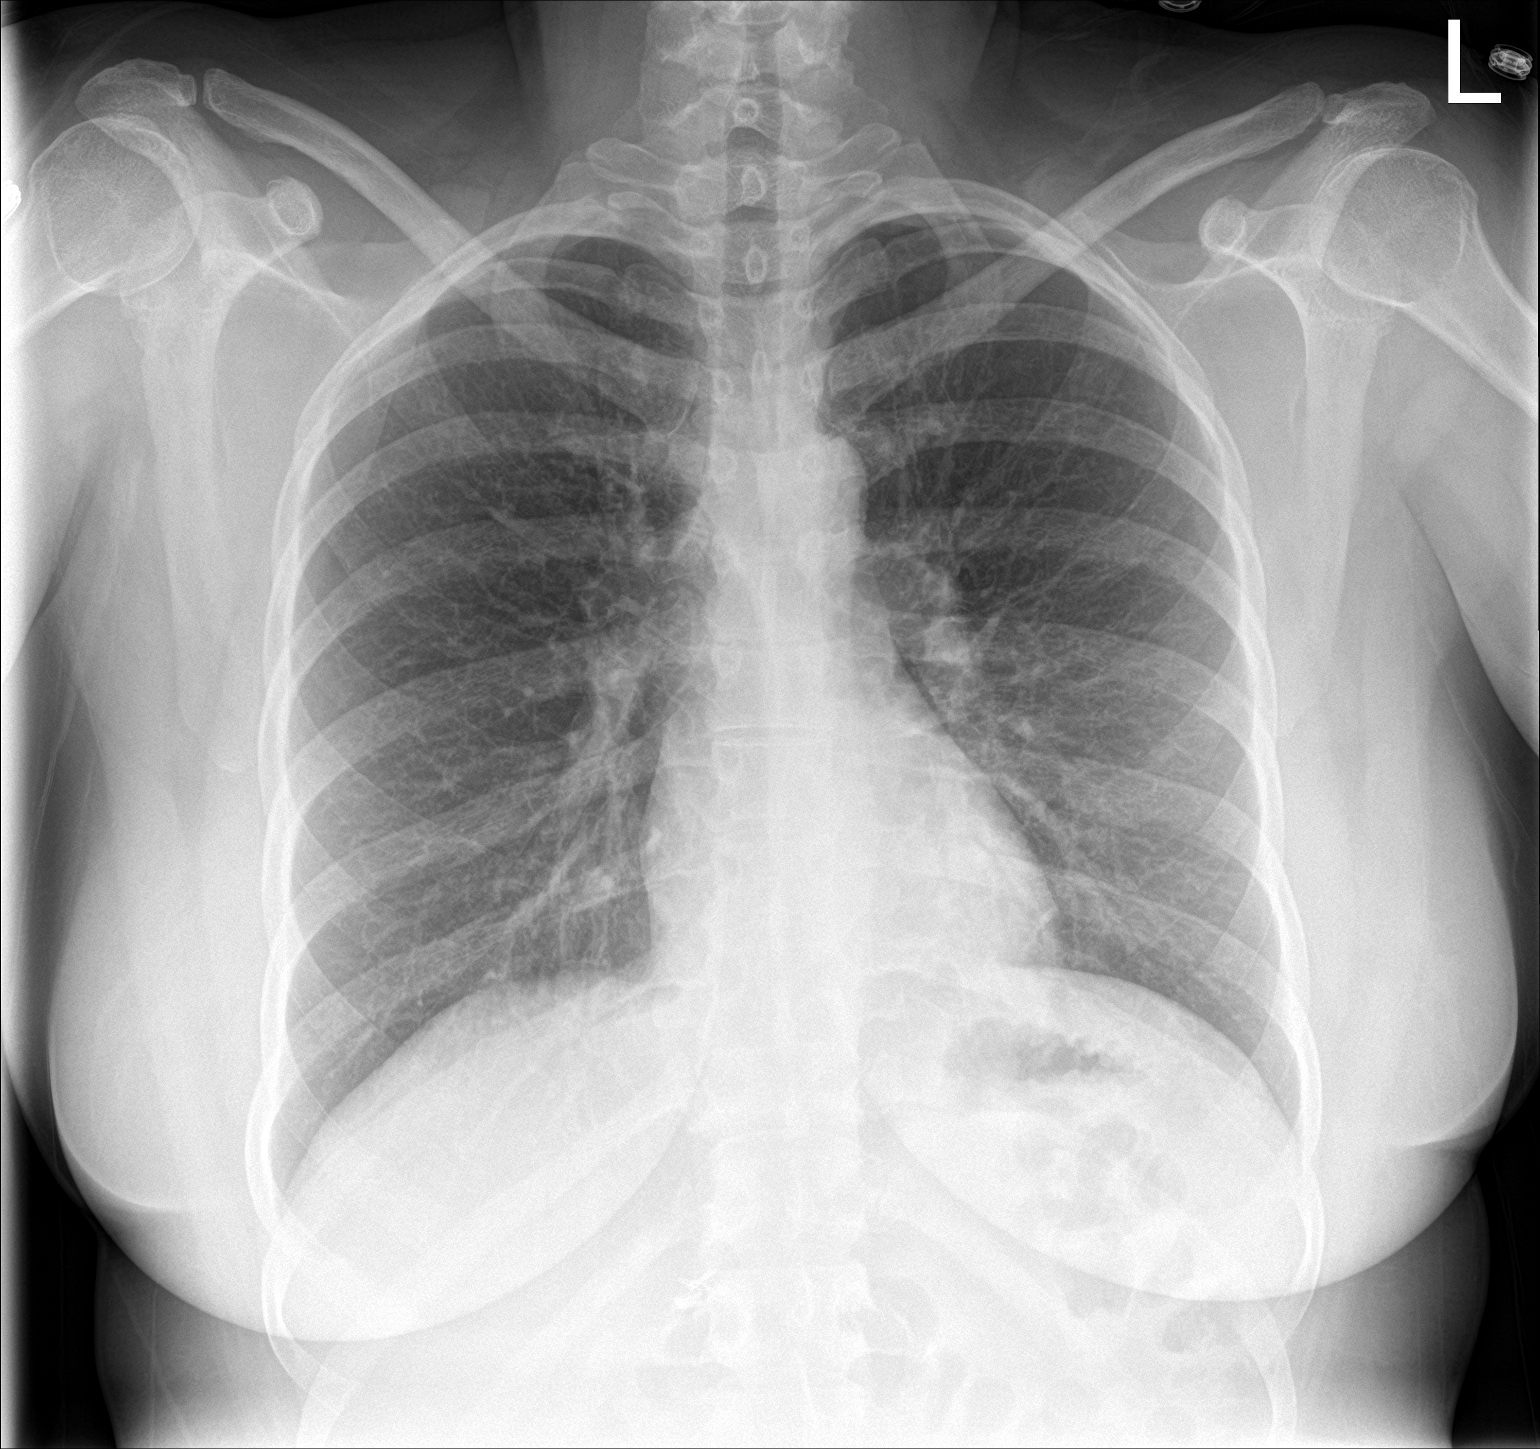

[chest lat]
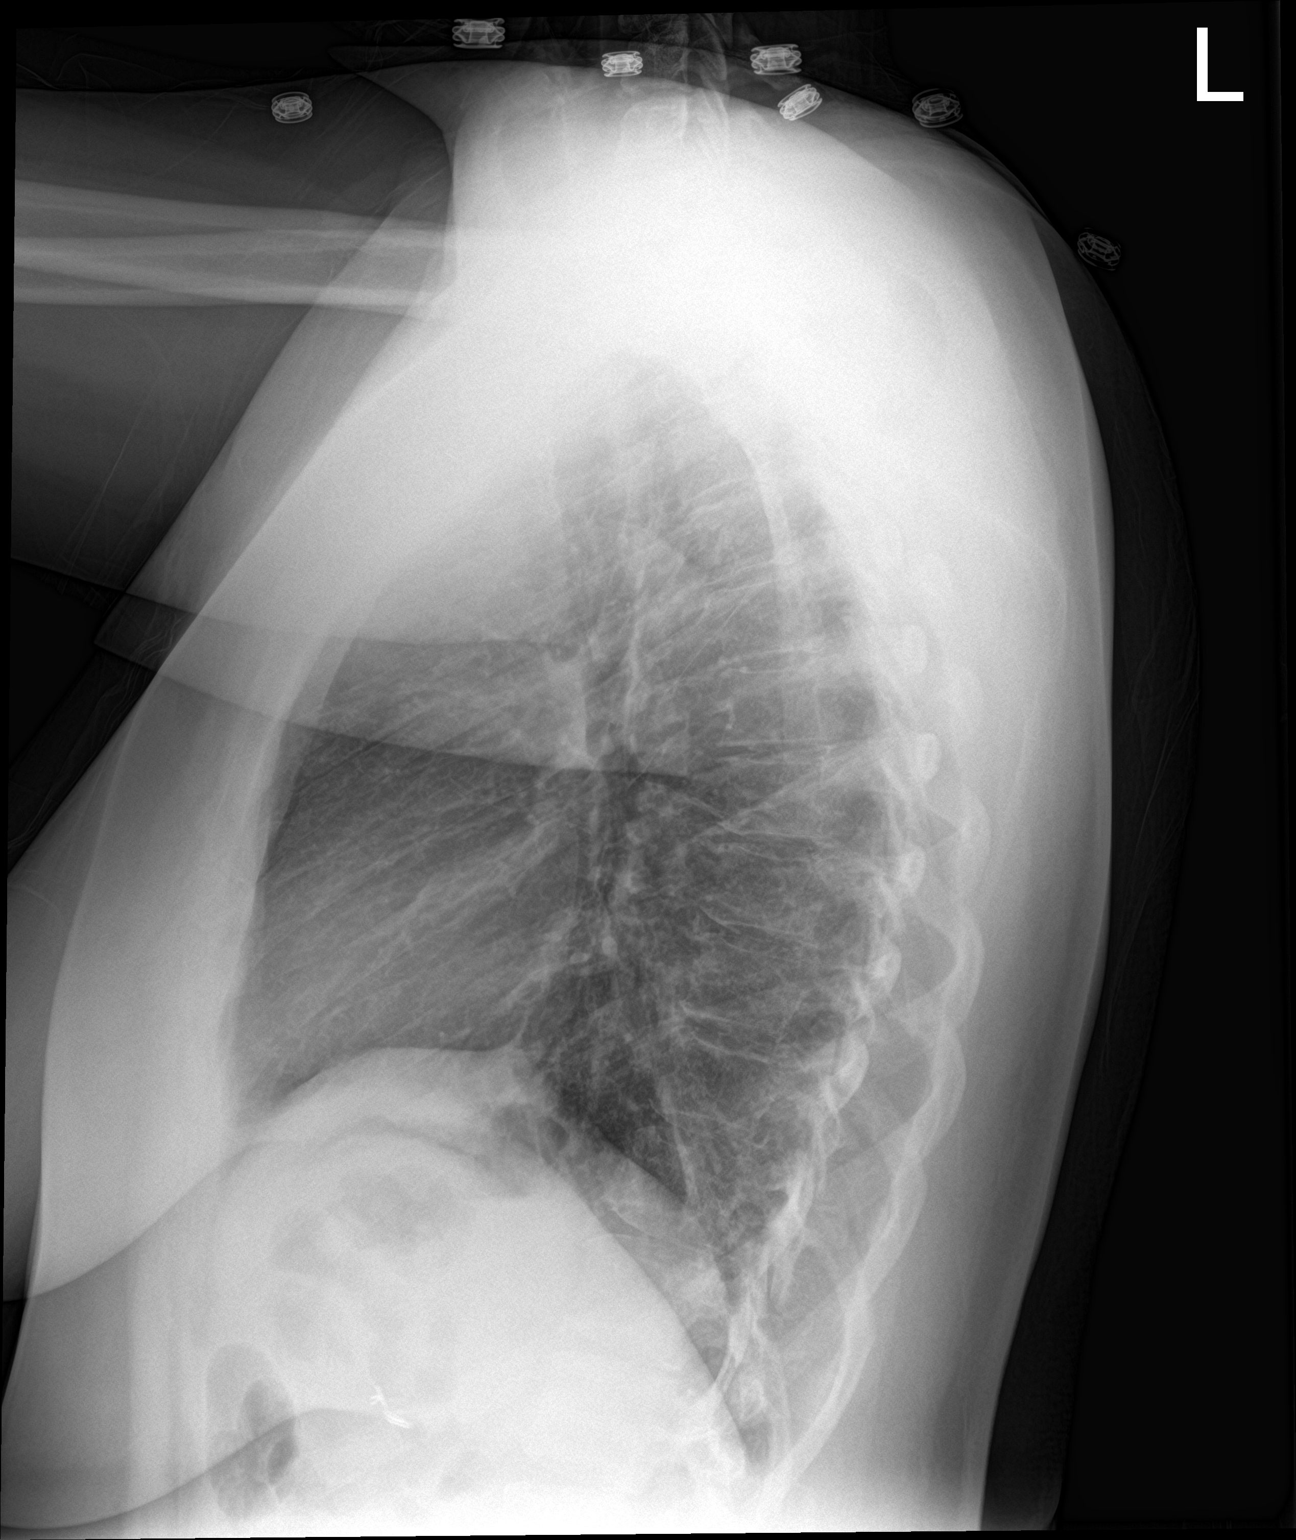

[2 of 2 positions shown; findings below may reference images not displayed]

FINDINGS: Mediastinum hilar structures normal. Very mild bilateral
interstitial prominence noted. An active process such as mild
pneumonitis cannot be excluded. Mild bibasilar subsegmental
atelectasis. No pleural effusion or pneumothorax. No acute bony
abnormality. Surgical clips upper abdomen.
IMPRESSION: Mild bilateral pulmonary interstitial prominence noted. An active
process such as pneumonitis cannot be excluded. Mild bibasilar
subsegmental atelectasis.

## 2020-09-22 ENCOUNTER — Encounter: Payer: Self-pay | Admitting: Emergency Medicine

## 2020-09-22 ENCOUNTER — Other Ambulatory Visit: Payer: Self-pay

## 2020-09-22 ENCOUNTER — Ambulatory Visit: Payer: 59 | Admitting: Emergency Medicine

## 2020-09-22 VITALS — BP 128/76 | HR 91 | Temp 98.2°F | Ht 65.0 in | Wt 206.0 lb

## 2020-09-22 DIAGNOSIS — R0981 Nasal congestion: Secondary | ICD-10-CM

## 2020-09-22 DIAGNOSIS — J0110 Acute frontal sinusitis, unspecified: Secondary | ICD-10-CM

## 2020-09-22 LAB — COMPREHENSIVE METABOLIC PANEL
ALT: 13 U/L (ref 0–35)
AST: 17 U/L (ref 0–37)
Albumin: 4 g/dL (ref 3.5–5.2)
Alkaline Phosphatase: 102 U/L (ref 39–117)
BUN: 12 mg/dL (ref 6–23)
CO2: 27 mEq/L (ref 19–32)
Calcium: 9.7 mg/dL (ref 8.4–10.5)
Chloride: 104 mEq/L (ref 96–112)
Creatinine, Ser: 1.06 mg/dL (ref 0.40–1.20)
GFR: 61.03 mL/min (ref >60.00)
Glucose, Bld: 89 mg/dL (ref 70–99)
Potassium: 5 mEq/L (ref 3.5–5.1)
Sodium: 139 mEq/L (ref 135–145)
Total Bilirubin: 0.9 mg/dL (ref 0.2–1.2)
Total Protein: 7.7 g/dL (ref 6.0–8.3)

## 2020-09-22 LAB — CBC WITH DIFFERENTIAL/PLATELET
Basophils Absolute: 0 10*3/uL (ref 0.0–0.1)
Basophils Relative: 0.6 % (ref 0.0–3.0)
Eosinophils Absolute: 0.1 10*3/uL (ref 0.0–0.7)
Eosinophils Relative: 1.2 % (ref 0.0–5.0)
HCT: 41.1 % (ref 36.0–46.0)
Hemoglobin: 13.6 g/dL (ref 12.0–15.0)
Lymphocytes Relative: 30.9 % (ref 12.0–46.0)
Lymphs Abs: 2.1 10*3/uL (ref 0.7–4.0)
MCHC: 33.1 g/dL (ref 30.0–36.0)
MCV: 90.1 fl (ref 78.0–100.0)
Monocytes Absolute: 0.6 10*3/uL (ref 0.1–1.0)
Monocytes Relative: 9 % (ref 3.0–12.0)
Neutro Abs: 4 10*3/uL (ref 1.4–7.7)
Neutrophils Relative %: 58.3 % (ref 43.0–77.0)
Platelets: 441 10*3/uL — ABNORMAL HIGH (ref 150.0–400.0)
RBC: 4.56 Mil/uL (ref 3.87–5.11)
RDW: 13.8 % (ref 11.5–15.5)
WBC: 6.9 10*3/uL (ref 4.0–10.5)

## 2020-09-22 LAB — VITAMIN B12: Vitamin B-12: 388 pg/mL (ref 211–911)

## 2020-09-22 MED ORDER — PSEUDOEPHEDRINE-GUAIFENESIN ER 60-600 MG PO TB12: 1.0000 | ORAL_TABLET | Freq: Two times a day (BID) | ORAL | 1 refills | Status: AC

## 2020-09-22 MED ORDER — AZITHROMYCIN 250 MG PO TABS: ORAL_TABLET | ORAL | 0 refills | Status: DC

## 2020-09-22 NOTE — Patient Instructions (Signed)

## 2020-09-22 NOTE — Progress Notes (Signed)
Rhonda [name] 50 y.o.   Chief Complaint  Patient presents with   Sinusitis    Facial pain and stuffy nose since last Thursday. Pt states she would like her B12 checked.    HISTORY OF PRESENT ILLNESS: This is a 50 y.o. female complaining of pain to left part of her face with sinus headache that started 5 days ago and is progressively getting worse.  Denies fever or chills.  Denies loss of taste or smell.  Denies cough or difficulty breathing.  Denies chest pain.  Denies abdominal pain or diarrhea.  No other associated significant symptoms. Also requesting to have her B12 level checked. No other complaints or medical concerns today. Fully vaccinated against COVID with boosters.  Sinusitis Associated symptoms include congestion. Pertinent negatives include no chills, coughing, headaches, shortness of breath or sore throat.    Prior to Admission medications   Medication Sig Start Date End Date Taking? Authorizing Provider  acetaminophen (TYLENOL) 325 MG tablet Take 325 mg by mouth every 4 (four) hours as needed for fever.    Yes [provider]  ALPRAZolam Duanne Moron) 0.5 MG tablet 1/2-1 tablet po bid prn anxiety 03/10/20  Yes Marrian Salvage, FNP  esomeprazole (NEXIUM) 20 MG capsule Take 20 mg by mouth daily at 12 noon.   Yes [provider]    Allergies  Allergen Reactions   Bactrim Ds [Sulfamethoxazole-Trimethoprim] Other (See Comments)    Blisters    Cephalosporins     Hives    Oseltamivir Phosphate Swelling   Amoxicillin Hives and Rash    Has patient had a PCN reaction causing immediate rash, facial/tongue/throat swelling, SOB or lightheadedness with hypotension: Yes Has patient had a PCN reaction causing severe rash involving mucus membranes or skin necrosis: Yes Has patient had a PCN reaction that required hospitalization No Has patient had a PCN reaction occurring within the last 10 years: Yes If all of the above answers are "NO", then may proceed with  Cephalosporin use.    Doxycycline Rash   Fluconazole Rash    Added blisters     Patient Active Problem List   Diagnosis Date Noted   Chronic night sweats 12/26/2019   Hyperglycemia 12/26/2019   Other chest pain 07/30/2019   Screen for colon cancer 07/30/2019   Psychophysiological insomnia 07/30/2019   Cough 08/29/2018   Thrombocytosis 11/06/2017   Chiari malformation type II ([place]) 09/27/2016   Abnormal MRI of head 03/07/2016   Gastroesophageal reflux disease without esophagitis 08/19/2015   IBS (irritable bowel syndrome) 08/19/2015   Routine general medical examination at a health care facility 07/14/2014   Vitamin B 12 deficiency 12/08/2008   CARPAL TUNNEL SYNDROME 05/14/2007   DE QUERVAIN'S TENOSYNOVITIS 05/14/2007   Allergic rhinitis 01/31/2007    Past Medical History:  Diagnosis Date   Allergic rhinitis    Allergy    B12 deficiency    Carpal tunnel syndrome    Dysfunction of eustachian tube     Past Surgical History:  Procedure Laterality Date   BUNIONECTOMY     right    CHOLECYSTECTOMY N/A 09/25/2015   Procedure: LAPAROSCOPIC CHOLECYSTECTOMY WITH INTRAOPERATIVE CHOLANGIOGRAM;  Surgeon: Alphonsa Overall, MD;  Location: [facility];  Service: General;  Laterality: N/A;   TONSILLECTOMY      Social History   Socioeconomic History   Marital status: Single    Spouse name: Not on file   Number of children: 0   Years of education: Assoc   Highest education level: Not on  file  Occupational History   Occupation: Personal assistant  Tobacco Use   Smoking status: Never   Smokeless tobacco: Never  Vaping Use   Vaping Use: Never used  Substance and Sexual Activity   Alcohol use: No   Drug use: No   Sexual activity: Not on file  Other Topics Concern   Not on file  Social History Narrative   College grad   Single   Bought her own home in '09   Occupation: Programme researcher, broadcasting/film/video and cleaning   Fun: Resting, yard work   Denies religious beliefs effecting health care.    Drinks  caffeine daily    Social Determinants of Radio broadcast assistant Strain: Not on file  Food Insecurity: Not on file  Transportation Needs: Not on file  Physical Activity: Not on file  Stress: Not on file  Social Connections: Not on file  Intimate Partner Violence: Not on file    Family History  Problem Relation Age of Onset   Heart disease Father    Hypertension Father    Diabetes Father    Hyperlipidemia Mother    Hypertension Mother    Heart disease Maternal Grandmother    Diabetes Brother    Headache Neg Hx    Colon cancer Neg Hx    Prostate cancer Neg Hx    Rectal cancer Neg Hx    Stomach cancer Neg Hx    Esophageal cancer Neg Hx      Review of Systems  Constitutional: Negative.  Negative for chills and fever.  HENT:  Positive for congestion and sinus pain. Negative for sore throat.   Respiratory: Negative.  Negative for cough and shortness of breath.   Cardiovascular: Negative.  Negative for chest pain and palpitations.  Gastrointestinal: Negative.  Negative for abdominal pain, blood in stool, diarrhea, melena, nausea and vomiting.  Genitourinary: Negative.  Negative for dysuria and hematuria.  Skin: Negative.  Negative for rash.  Neurological: Negative.  Negative for dizziness and headaches.  All other systems reviewed and are negative.   Physical Exam Vitals reviewed.  Constitutional:      Appearance: Normal appearance.  HENT:     Head: Normocephalic.     Right Ear: Tympanic membrane, ear canal and external ear normal.     Left Ear: Tympanic membrane, ear canal and external ear normal.     Nose: Congestion present.     Right Sinus: No maxillary sinus tenderness or frontal sinus tenderness.     Left Sinus: Maxillary sinus tenderness and frontal sinus tenderness present.  Neck:     Vascular: No carotid bruit.  Cardiovascular:     Rate and Rhythm: Normal rate and regular rhythm.     Pulses: Normal pulses.     Heart sounds: Normal heart sounds.   Pulmonary:     Effort: Pulmonary effort is normal.     Breath sounds: Normal breath sounds.  Musculoskeletal:        General: Normal range of motion.     Cervical back: Normal range of motion and neck supple. No tenderness.  Lymphadenopathy:     Cervical: No cervical adenopathy.  Skin:    General: Skin is warm and dry.     Capillary Refill: Capillary refill takes less than 2 seconds.  Neurological:     General: No focal deficit present.     Mental Status: She is alert and oriented to person, place, and time.  Psychiatric:        Mood and  Affect: Mood normal.        Behavior: Behavior normal.     ASSESSMENT & PLAN: Maddalena was seen today for sinusitis.  Diagnoses and all orders for this visit:  Acute non-recurrent frontal sinusitis -     azithromycin (ZITHROMAX) 250 MG tablet; Sig as indicated -     CBC with Differential/Platelet -     Comprehensive metabolic panel -     Vitamin B12  Sinus congestion -     pseudoephedrine-guaifenesin (MUCINEX D) 60-600 MG 12 hr tablet; Take 1 tablet by mouth every 12 (twelve) hours for 5 days.  Patient Instructions  Sinusitis, Adult Sinusitis is soreness and swelling (inflammation) of your sinuses. Sinuses are hollow spaces in the bones around your face. They are located: Around your eyes. In the middle of your forehead. Behind your nose. In your cheekbones. Your sinuses and nasal passages are lined with a fluid called mucus. Mucus drains out of your sinuses. Swelling can trap mucus in your sinuses. This lets germs (bacteria, virus, or fungus) grow, which leads to infection. Most of the time, this condition is caused bya virus. What are the causes? This condition is caused by: Allergies. Asthma. Germs. Things that block your nose or sinuses. Growths in the nose (nasal polyps). Chemicals or irritants in the air. Fungus (rare). What increases the risk? You are more likely to develop this condition if: You have a weak body defense  system (immune system). You do a lot of swimming or diving. You use nasal sprays too much. You smoke. What are the signs or symptoms? The main symptoms of this condition are pain and a feeling of pressure around the sinuses. Other symptoms include: Stuffy nose (congestion). Runny nose (drainage). Swelling and warmth in the sinuses. Headache. Toothache. A cough that may get worse at night. Mucus that collects in the throat or the back of the nose (postnasal drip). Being unable to smell and taste. Being very tired (fatigue). A fever. Sore throat. Bad breath. How is this diagnosed? This condition is diagnosed based on: Your symptoms. Your medical history. A physical exam. Tests to find out if your condition is short-term (acute) or long-term (chronic). Your doctor may: Check your nose for growths (polyps). Check your sinuses using a tool that has a light (endoscope). Check for allergies or germs. Do imaging tests, such as an MRI or CT scan. How is this treated? Treatment for this condition depends on the cause and whether it is short-term or long-term. If caused by a virus, your symptoms should go away on their own within 10 days. You may be given medicines to relieve symptoms. They include: Medicines that shrink swollen tissue in the nose. Medicines that treat allergies (antihistamines). A spray that treats swelling of the nostrils.  Rinses that help get rid of thick mucus in your nose (nasal saline washes). If caused by bacteria, your doctor may wait to see if you will get better without treatment. You may be given antibiotic medicine if you have: A very bad infection. A weak body defense system. If caused by growths in the nose, you may need to have surgery. Follow these instructions at home: Medicines Take, use, or apply over-the-counter and prescription medicines only as told by your doctor. These may include nasal sprays. If you were prescribed an antibiotic medicine,  take it as told by your doctor. Do not stop taking the antibiotic even if you start to feel better. Hydrate and humidify  Drink enough water to keep your  pee (urine) pale yellow. Use a cool mist humidifier to keep the humidity level in your home above 50%. Breathe in steam for 10-15 minutes, 3-4 times a day, or as told by your doctor. You can do this in the bathroom while a hot shower is running. Try not to spend time in cool or dry air.  Rest Rest as much as you can. Sleep with your head raised (elevated). Make sure you get enough sleep each night. General instructions  Put a warm, moist washcloth on your face 3-4 times a day, or as often as told by your doctor. This will help with discomfort. Wash your hands often with soap and water. If there is no soap and water, use hand sanitizer. Do not smoke. Avoid being around people who are smoking (secondhand smoke). Keep all follow-up visits as told by your doctor. This is important.  Contact a doctor if: You have a fever. Your symptoms get worse. Your symptoms do not get better within 10 days. Get help right away if: You have a very bad headache. You cannot stop throwing up (vomiting). You have very bad pain or swelling around your face or eyes. You have trouble seeing. You feel confused. Your neck is stiff. You have trouble breathing. Summary Sinusitis is swelling of your sinuses. Sinuses are hollow spaces in the bones around your face. This condition is caused by tissues in your nose that become inflamed or swollen. This traps germs. These can lead to infection. If you were prescribed an antibiotic medicine, take it as told by your doctor. Do not stop taking it even if you start to feel better. Keep all follow-up visits as told by your doctor. This is important. This information is not intended to replace advice given to you by your health care provider. Make sure you discuss any questions you have with your healthcare  provider. Document Revised: 06/12/2017 Document Reviewed: 06/12/2017 Elsevier Patient Education  2022 [place], MD [facility]

## 2020-09-29 IMAGING — CT CT ANGIOGRAPHY CHEST
2 of 6 series · 18 of 46 positions shown · IV contrast (omnipaque)
Comparison: Chest x-ray 08/30/2018

CLINICAL DATA: Follow-up pneumonia.

EXAM:
CT ANGIOGRAPHY CHEST WITH CONTRAST
TECHNIQUE: Multidetector CT imaging of the chest was performed using the
standard protocol during bolus administration of intravenous
contrast. Multiplanar CT image reconstructions and MIPs were
obtained to evaluate the vascular anatomy.
CONTRAST:  100mL OMNIPAQUE IOHEXOL 350 MG/ML SOLN

[Series 5: thins · axial · 0.71mm/px · z∈[-274,-43]mm · 15 of 254 slices shown]
[im 12/254  lung]
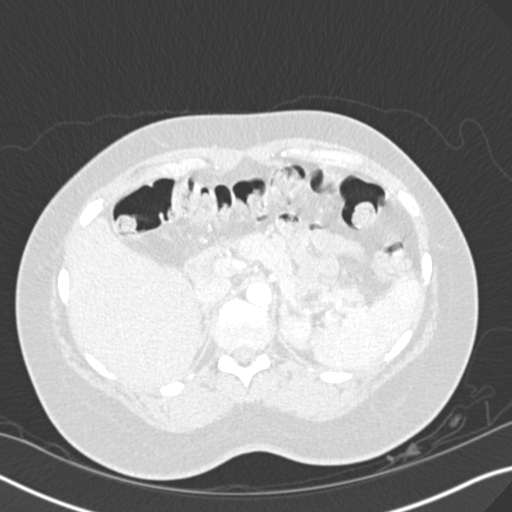
[im 34/254  soft-tissue]
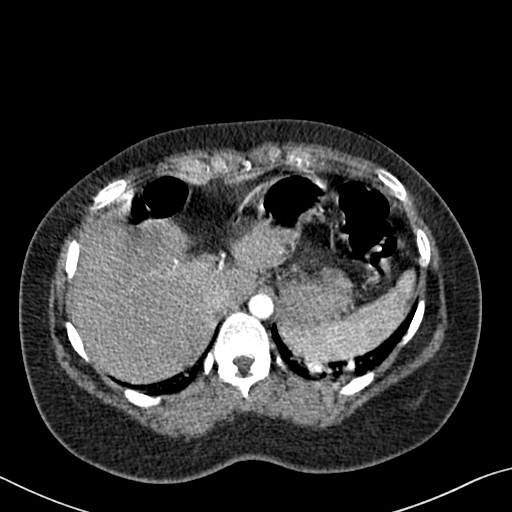
[im 45/254  lung]
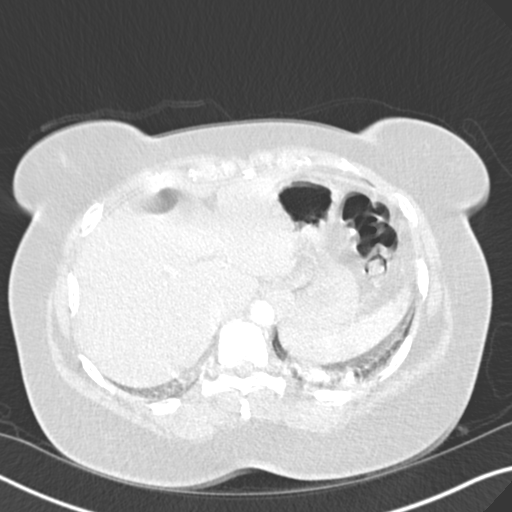
[im 67/254  soft-tissue]
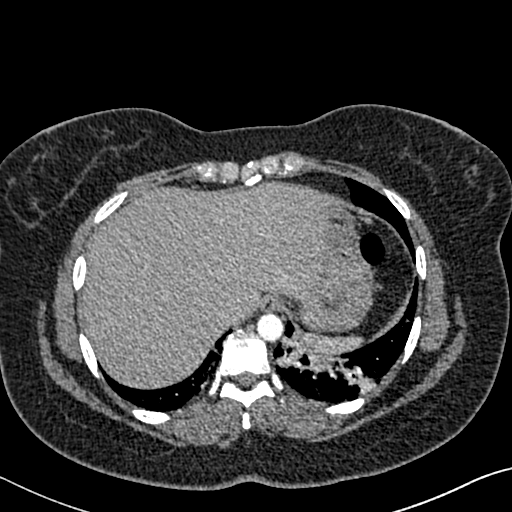
[im 78/254  lung]
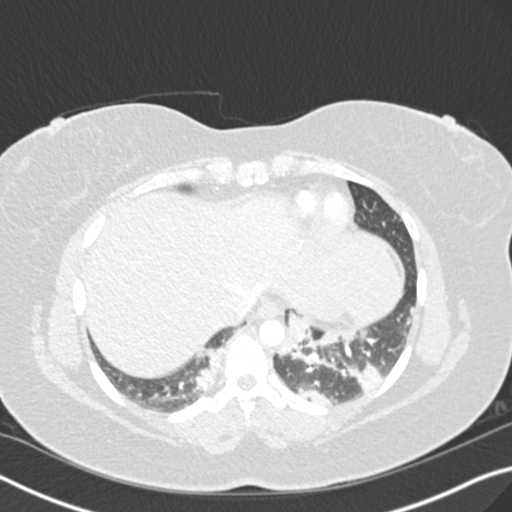
[im 100/254  soft-tissue]
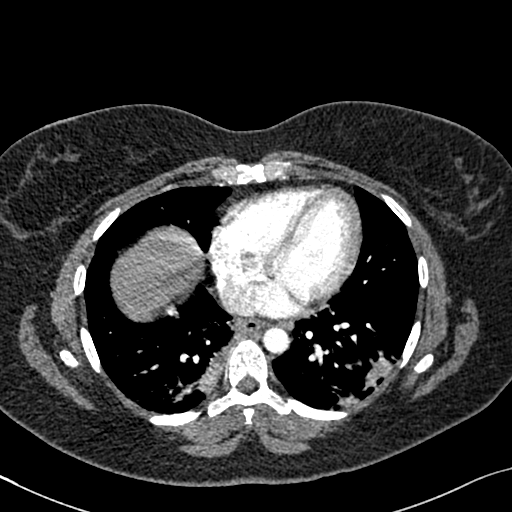
[im 111/254  lung]
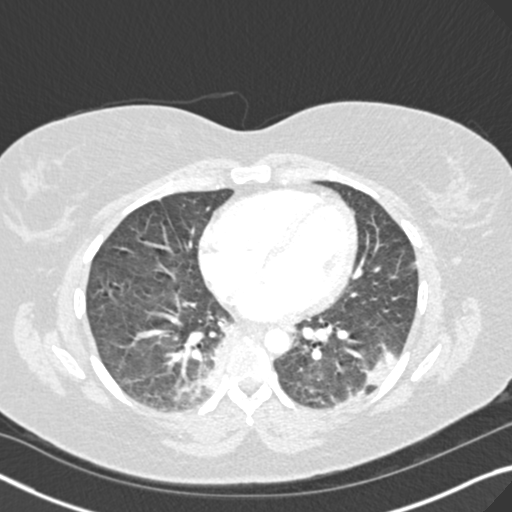
[im 133/254  soft-tissue]
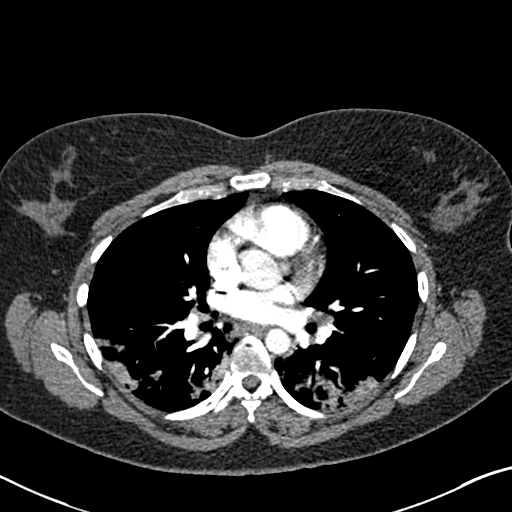
[im 144/254  lung]
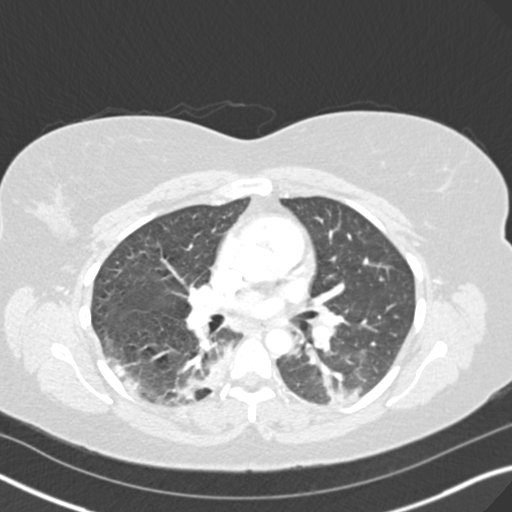
[im 155/254  soft-tissue]
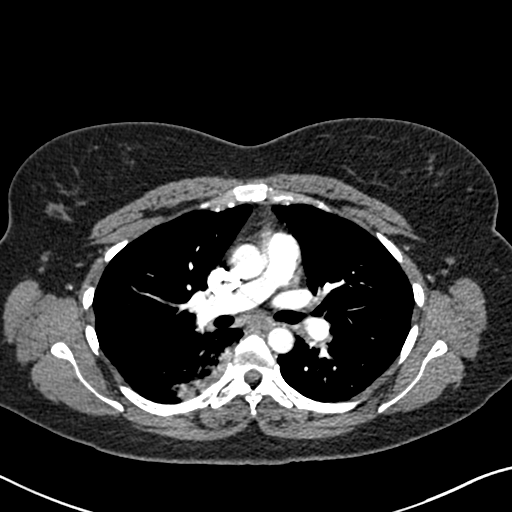
[im 177/254  lung]
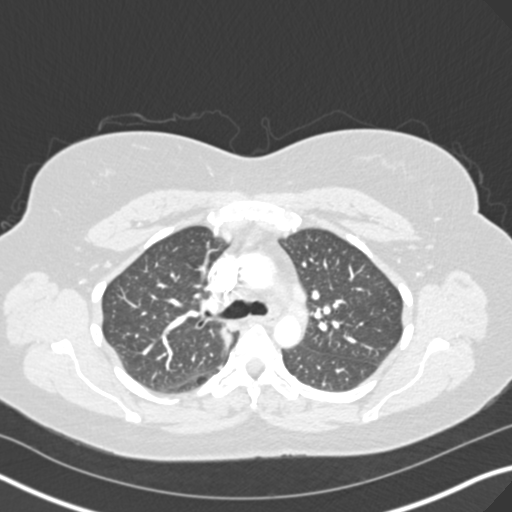
[im 188/254  soft-tissue]
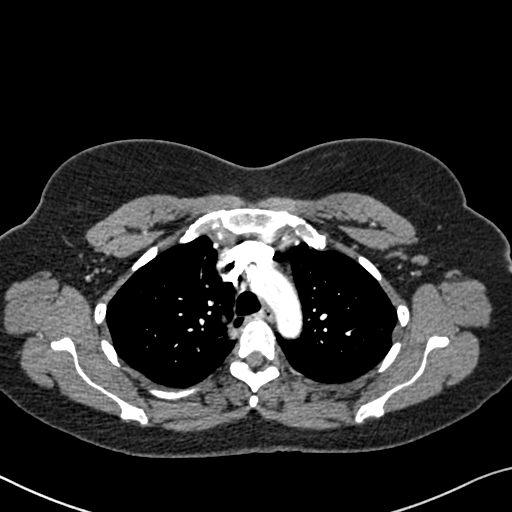
[im 210/254  lung]
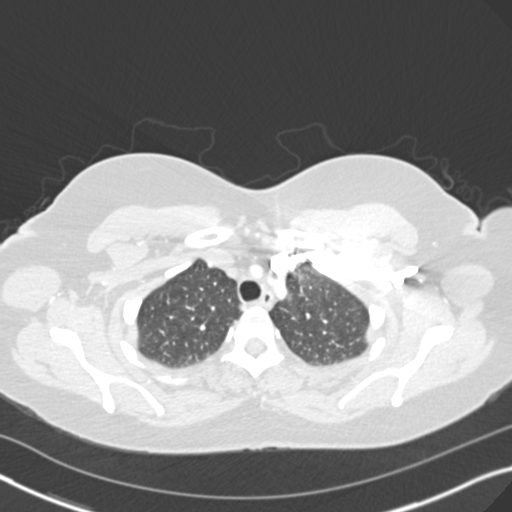
[im 221/254  soft-tissue]
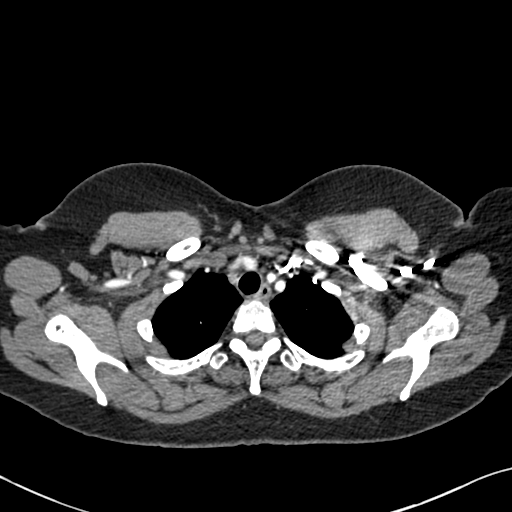
[im 243/254  lung]
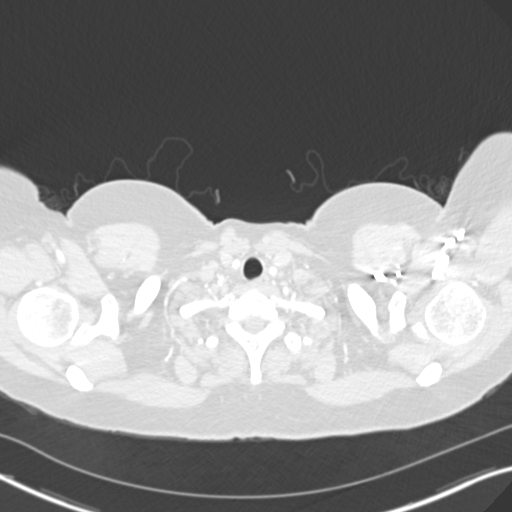

[Series 7: coronal mpr · coronal · 0.52mm/px · 3 of 122 slices shown]
[im 31/122  soft-tissue]
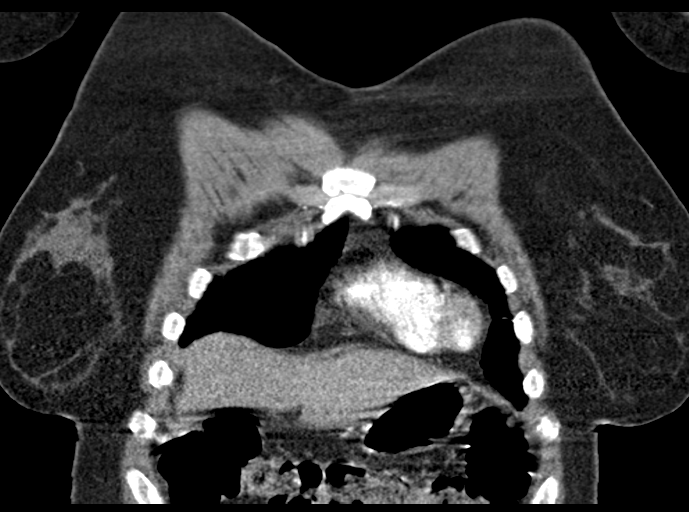
[im 61/122  soft-tissue]
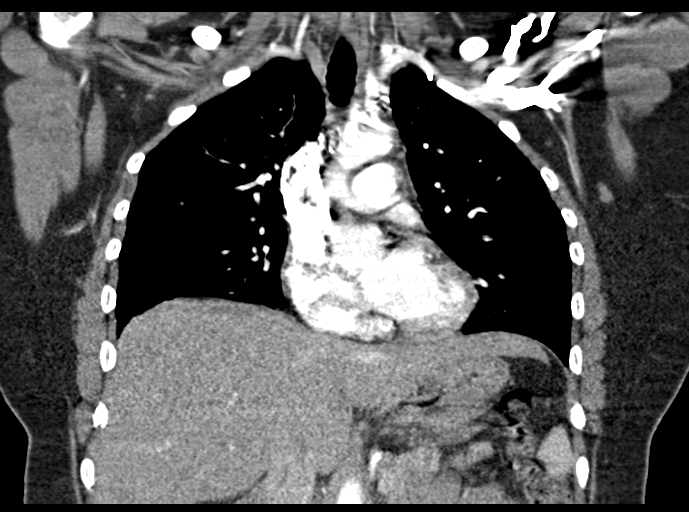
[im 91/122  soft-tissue]
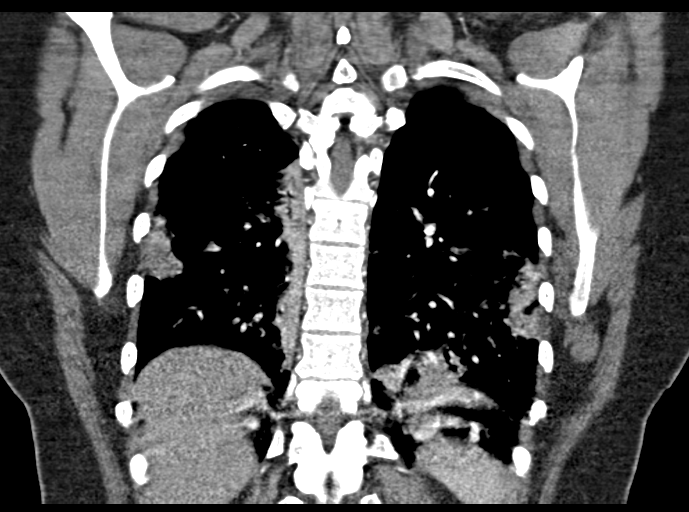

[18 of 46 positions shown; findings below may reference images not displayed]

FINDINGS: Cardiovascular: The heart is normal in size. No pericardial
effusion. The aorta is normal in caliber. No dissection. No
atherosclerotic calcifications. No definite coronary artery
calcifications.

The pulmonary arterial tree is fairly well opacified. No filling
defects to suggest pulmonary embolism.

Mediastinum/Nodes: Borderline enlarged mediastinal and hilar lymph
nodes, likely inflammatory given the lung findings. The esophagus is
grossly normal.

Lungs/Pleura: There are patchy bilateral lower lobe peripheral areas
of airspace consolidation suggesting multifocal pneumonia. No
worrisome pulmonary lesions. No pleural effusions.

Upper Abdomen: No significant upper abdominal findings. Suspect
fatty infiltration of the liver.

Musculoskeletal: No breast masses, supraclavicular or axillary
adenopathy. The bony structures are intact.

Review of the MIP images confirms the above findings.
IMPRESSION: 1. Patchy bilateral lower lobe infiltrates.
2. Inflammatory/hyperplastic mediastinal and hilar lymph nodes.
3. No CT findings suspicious for pulmonary embolism.
4. Normal thoracic aorta.

## 2020-10-05 IMAGING — DX CHEST - 2 VIEW
2 series · 2 of 2 positions shown · non-contrast
Comparison: CTA chest dated 08/30/2018

CLINICAL DATA: Follow-up pneumonia

EXAM:
CHEST - 2 VIEW

[chest pa]
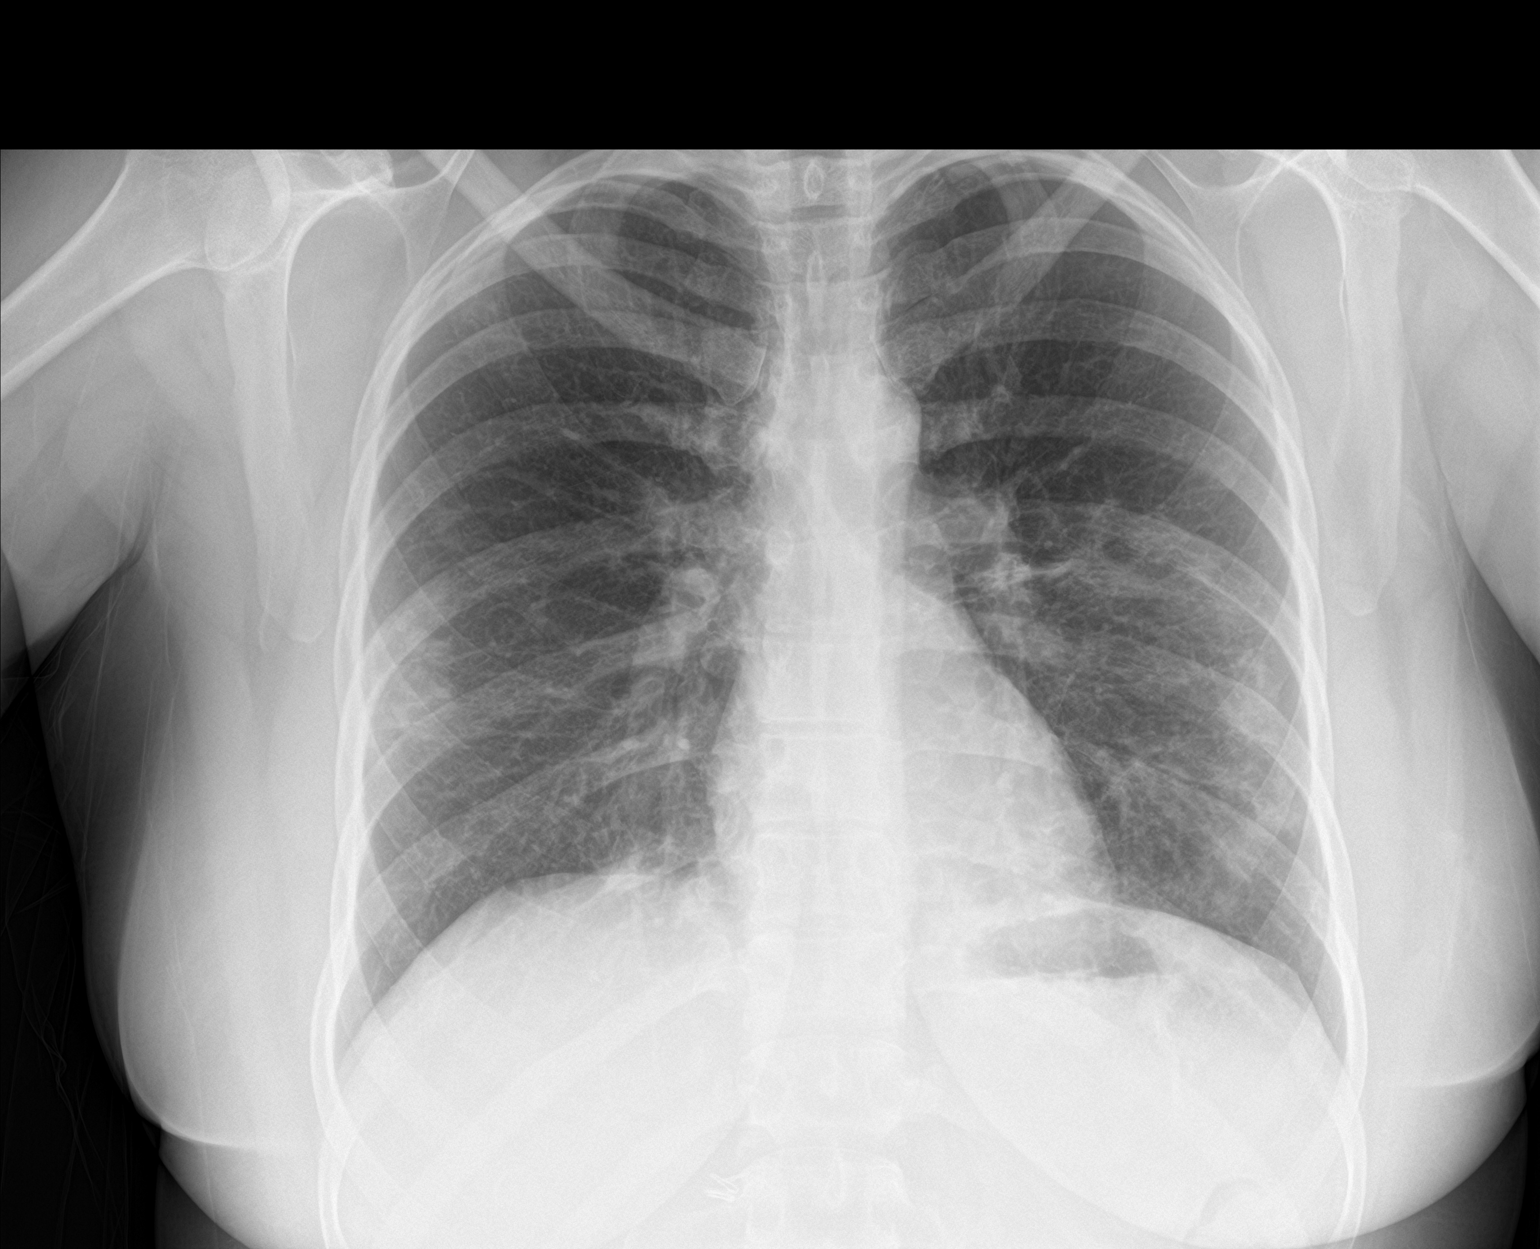

[chest lat]
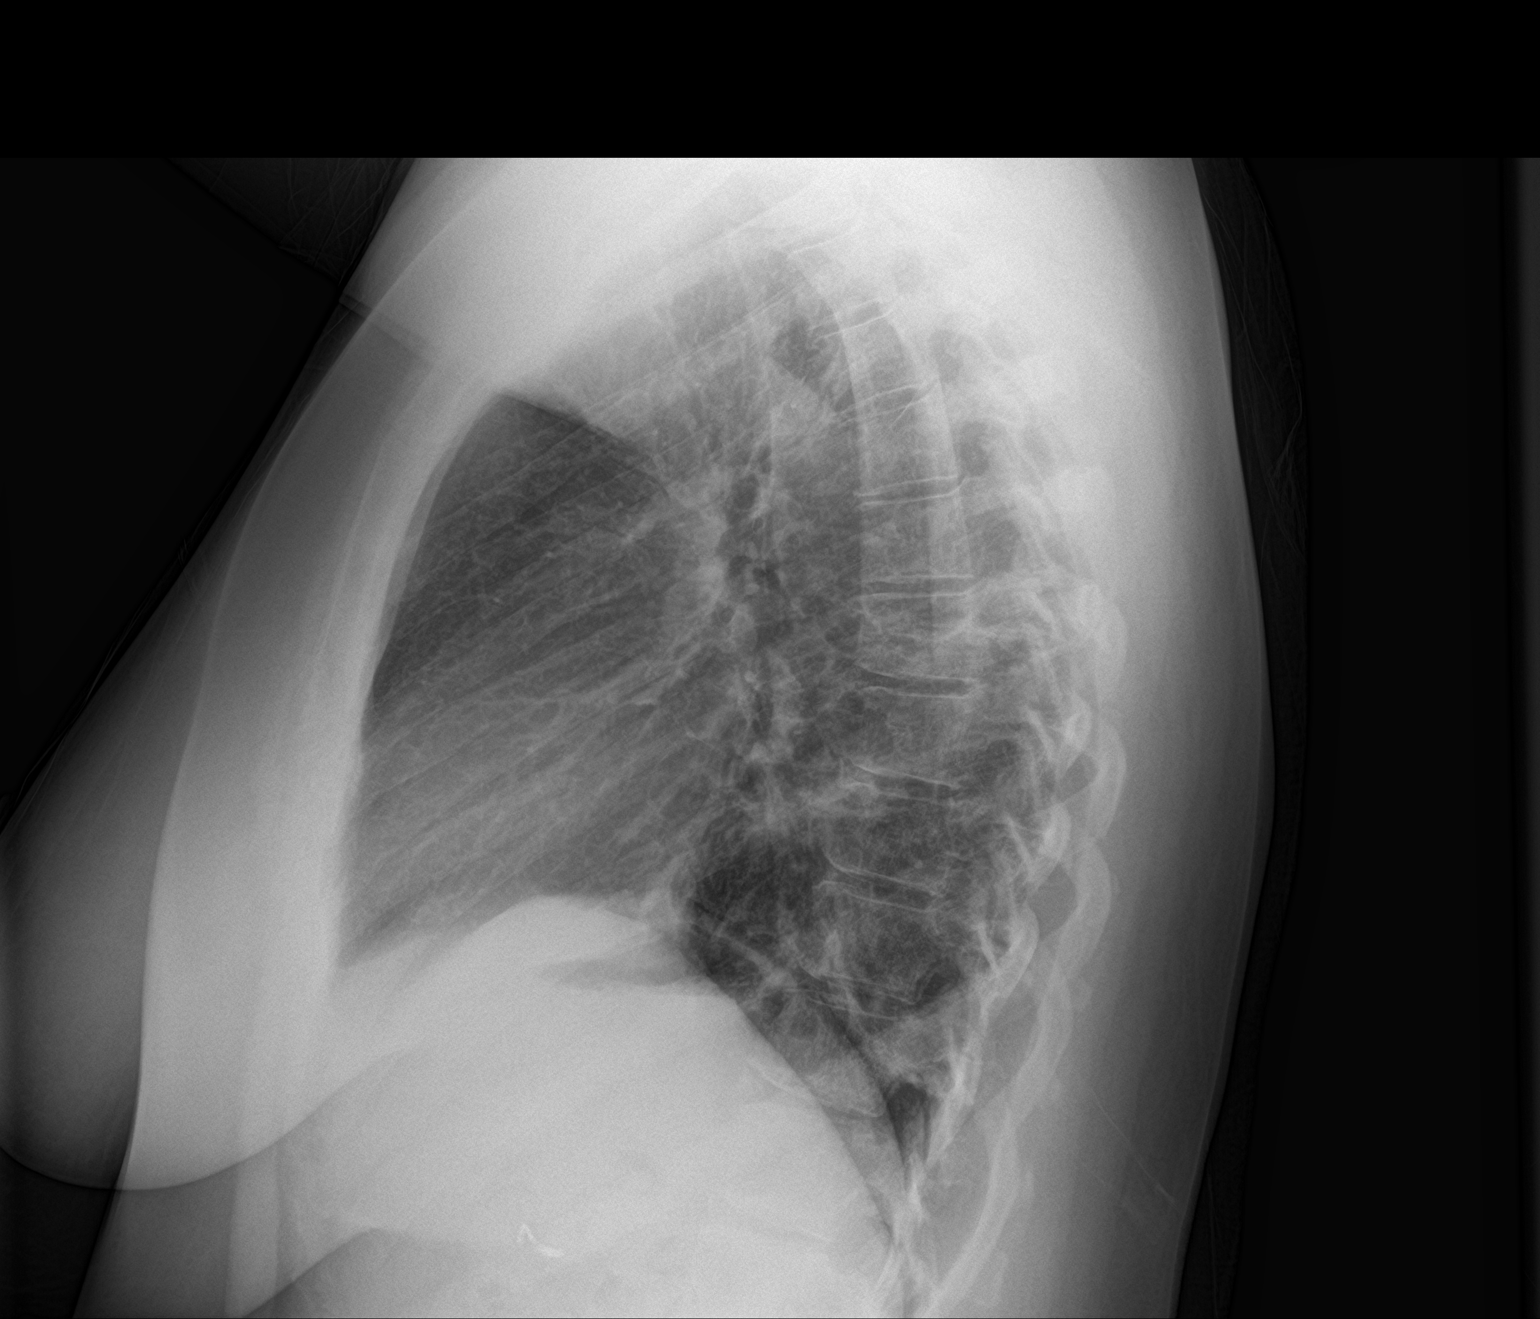

[2 of 2 positions shown; findings below may reference images not displayed]

FINDINGS: Multifocal patchy opacities in the bilateral lower lobes, similar to
the prior. Subpleural/peripheral distribution. No pleural effusion
or pneumothorax.

The heart is normal in size.

Visualized osseous structures are within normal limits.
IMPRESSION: Multifocal pneumonia, grossly unchanged. As previously noted, viral
etiologies remain possible.

## 2021-01-12 LAB — HM PAP SMEAR

## 2021-01-12 LAB — HM MAMMOGRAPHY: HM Mammogram: NORMAL (ref 0–4)

## 2021-02-08 ENCOUNTER — Other Ambulatory Visit: Payer: Self-pay

## 2021-02-08 ENCOUNTER — Encounter: Payer: Self-pay | Admitting: Internal Medicine

## 2021-02-08 ENCOUNTER — Ambulatory Visit (INDEPENDENT_AMBULATORY_CARE_PROVIDER_SITE_OTHER): Payer: 59 | Admitting: Internal Medicine

## 2021-02-08 ENCOUNTER — Ambulatory Visit (INDEPENDENT_AMBULATORY_CARE_PROVIDER_SITE_OTHER): Payer: 59

## 2021-02-08 VITALS — BP 118/64 | HR 89 | Temp 97.9°F | Resp 16 | Ht 65.0 in | Wt 206.0 lb

## 2021-02-08 DIAGNOSIS — R053 Chronic cough: Secondary | ICD-10-CM | POA: Diagnosis not present

## 2021-02-08 DIAGNOSIS — M170 Bilateral primary osteoarthritis of knee: Secondary | ICD-10-CM | POA: Diagnosis not present

## 2021-02-08 DIAGNOSIS — D75839 Thrombocytosis, unspecified: Secondary | ICD-10-CM | POA: Diagnosis not present

## 2021-02-08 DIAGNOSIS — E876 Hypokalemia: Secondary | ICD-10-CM

## 2021-02-08 DIAGNOSIS — Z23 Encounter for immunization: Secondary | ICD-10-CM

## 2021-02-08 DIAGNOSIS — Z0000 Encounter for general adult medical examination without abnormal findings: Secondary | ICD-10-CM

## 2021-02-08 DIAGNOSIS — E538 Deficiency of other specified B group vitamins: Secondary | ICD-10-CM | POA: Diagnosis not present

## 2021-02-08 DIAGNOSIS — R052 Subacute cough: Secondary | ICD-10-CM | POA: Insufficient documentation

## 2021-02-08 DIAGNOSIS — Z1159 Encounter for screening for other viral diseases: Secondary | ICD-10-CM | POA: Insufficient documentation

## 2021-02-08 NOTE — Patient Instructions (Signed)

## 2021-02-08 NOTE — Progress Notes (Signed)
Subjective:  Patient ID: Rhonda [name], female    DOB: 12-28-1970  Age: 51 y.o. MRN: [id number]  CC: Annual Exam, Cough, and Osteoarthritis  This visit occurred during the SARS-CoV-2 public health emergency.  Safety protocols were in place, including screening questions prior to the visit, additional usage of staff PPE, and extensive cleaning of exam room while observing appropriate contact time as indicated for disinfecting solutions.    HPI Rhonda [name] presents for a CPX and f/up -   She continues to c/o intermittent NP cough for 3 years.  She denies chest pain, shortness of breath, wheezing, fever, chills, weight loss, or night sweats.  She attributes the cough to 3 years ago when she was diagnosed with COVID-19 infection.  Outpatient Medications Prior to Visit  Medication Sig Dispense Refill   acetaminophen (TYLENOL) 325 MG tablet Take 325 mg by mouth every 4 (four) hours as needed for fever.      esomeprazole (NEXIUM) 20 MG capsule Take 20 mg by mouth daily at 12 noon.     ALPRAZolam (XANAX) 0.5 MG tablet 1/2-1 tablet po bid prn anxiety 30 tablet 0   azithromycin (ZITHROMAX) 250 MG tablet Sig as indicated 6 tablet 0   No facility-administered medications prior to visit.    ROS Review of Systems  Constitutional:  Negative for chills, diaphoresis, fatigue and fever.  HENT: Negative.  Negative for sinus pressure, sore throat and trouble swallowing.   Eyes: Negative.   Respiratory:  Positive for cough. Negative for chest tightness, shortness of breath and wheezing.   Cardiovascular:  Negative for chest pain, palpitations and leg swelling.  Gastrointestinal:  Negative for abdominal pain, blood in stool, constipation, diarrhea, nausea and vomiting.  Endocrine: Negative.   Genitourinary: Negative.  Negative for difficulty urinating, dysuria and urgency.  Musculoskeletal:  Positive for arthralgias and back pain. Negative for joint swelling and myalgias.  Neurological:  Positive  for numbness. Negative for dizziness, weakness, light-headedness and headaches.       Since I last saw her she has developed N/T in her fingers and toes  Hematological:  Negative for adenopathy. Does not bruise/bleed easily.  Psychiatric/Behavioral: Negative.     Objective:  BP 118/64 (BP Location: Right Arm, Patient Position: Sitting, Cuff Size: Large)    Pulse 89    Temp 97.9 F (36.6 C) (Oral)    Resp 16    Ht 5\' 5"  (1.651 m)    Wt 206 lb (93.4 kg)    LMP 01/24/2021    SpO2 98%    BMI 34.28 kg/m   BP Readings from Last 3 Encounters:  02/08/21 118/64  09/22/20 128/76  04/03/20 120/80    Wt Readings from Last 3 Encounters:  02/08/21 206 lb (93.4 kg)  09/22/20 206 lb (93.4 kg)  04/03/20 204 lb (92.5 kg)    Physical Exam Vitals reviewed.  Constitutional:      Appearance: She is not ill-appearing.  HENT:     Nose: Nose normal.     Mouth/Throat:     Mouth: Mucous membranes are moist.  Eyes:     General: No scleral icterus.    Conjunctiva/sclera: Conjunctivae normal.  Cardiovascular:     Rate and Rhythm: Normal rate and regular rhythm.     Heart sounds: No murmur heard. Pulmonary:     Effort: Pulmonary effort is normal.     Breath sounds: No stridor. No wheezing, rhonchi or rales.  Abdominal:     General: Abdomen is flat.  Palpations: There is no mass.     Tenderness: There is no abdominal tenderness. There is no guarding.     Hernia: No hernia is present.  Musculoskeletal:        General: Deformity (djd) present. No swelling or tenderness. Normal range of motion.     Cervical back: Normal and neck supple.     Thoracic back: Normal.     Lumbar back: Normal.     Right knee: Deformity (DJD) present. No swelling, effusion, erythema or bony tenderness. Normal range of motion. No tenderness.     Left knee: Deformity (DJD) present. No swelling, effusion, erythema or bony tenderness. Normal range of motion. No tenderness.     Right lower leg: No edema.     Left lower leg:  No edema.  Lymphadenopathy:     Cervical: No cervical adenopathy.  Skin:    General: Skin is warm and dry.     Findings: No rash.  Neurological:     General: No focal deficit present.     Mental Status: She is alert. Mental status is at baseline.  Psychiatric:        Mood and Affect: Mood normal.        Behavior: Behavior normal.    Lab Results  Component Value Date   WBC 7.9 02/08/2021   HGB 13.3 02/08/2021   HCT 40.6 02/08/2021   PLT 462.0 (H) 02/08/2021   GLUCOSE 79 02/08/2021   CHOL 190 02/08/2021   TRIG 87.0 02/08/2021   HDL 68.00 02/08/2021   LDLCALC 104 (H) 02/08/2021   ALT 13 02/08/2021   AST 16 02/08/2021   NA 136 02/08/2021   K 3.4 (L) 02/08/2021   CL 99 02/08/2021   CREATININE 0.78 02/08/2021   BUN 10 02/08/2021   CO2 27 02/08/2021   TSH 0.62 04/03/2020   HGBA1C 5.4 04/03/2020    CT Chest W Contrast  Result Date: 01/03/2020 CLINICAL DATA:  Chest wall pain. EXAM: CT CHEST WITH CONTRAST TECHNIQUE: Multidetector CT imaging of the chest was performed during intravenous contrast administration. CONTRAST:  33mL OMNIPAQUE IOHEXOL 300 MG/ML  SOLN COMPARISON:  None. FINDINGS: Cardiovascular: No significant vascular findings. Normal heart size. No pericardial effusion. Mediastinum/Nodes: No enlarged mediastinal, hilar, or axillary lymph nodes. Thyroid gland, trachea, and esophagus demonstrate no significant findings. Lungs/Pleura: Mild scattered atelectasis without focal consolidation. No pleural effusions. No pneumothorax. Upper Abdomen: No acute abnormality. Musculoskeletal: No chest wall abnormality. No acute or significant osseous findings. IMPRESSION: No evidence of acute abnormality. Mild scattered atelectasis without focal consolidation. Electronically Signed   By: Margaretha Sheffield MD   On: 01/03/2020 14:34   DG Chest 2 View  Result Date: 02/08/2021 CLINICAL DATA:  Chronic cough EXAM: CHEST - 2 VIEW COMPARISON:  07/30/2019 FINDINGS: Mild bronchitic changes. No  focal airspace disease or effusion. Normal cardiomediastinal silhouette. No pneumothorax. IMPRESSION: No active cardiopulmonary disease.  Mild bronchitic changes Electronically Signed   By: Donavan Foil M.D.   On: 02/08/2021 18:01     Assessment & Plan:   Azaliah was seen today for annual exam, cough and osteoarthritis.  Diagnoses and all orders for this visit:  Thrombocytosis- Her platelet count remains mildly elevated.  She is asymptomatic.  Iron level is normal.  This is benign. -     CBC with Differential/Platelet; Future -     IBC + Ferritin; Future -     Hepatic function panel; Future -     Hepatic function panel -  IBC + Ferritin -     CBC with Differential/Platelet  Vitamin B 12 deficiency- Her H&H, B12, and folate are normal now. -     Vitamin B12; Future -     CBC with Differential/Platelet; Future -     Hepatic function panel; Future -     Folate; Future -     Folate -     Hepatic function panel -     CBC with Differential/Platelet -     Vitamin B12  Routine general medical examination at a health care facility- Exam completed, labs reviewed, vaccines reviewed, cancer screenings are up-to-date, patient education was given. -     Lipid panel; Future -     Lipid panel  Chronic cough- Her chest x-ray is negative for mass or infiltrate. -     DG Chest 2 View; Future -     Ambulatory referral to Pulmonology  Primary osteoarthritis of both knees -     Ambulatory referral to Orthopedic Surgery -     Basic metabolic panel; Future -     Basic metabolic panel  Need for hepatitis C screening test -     Hepatitis C antibody; Future -     Hepatitis C antibody  Acute hypokalemia -     potassium chloride SA (KLOR-CON M15) 15 MEQ tablet; Take 1 tablet (15 mEq total) by mouth 2 (two) times daily.  Other orders -     Varicella-zoster vaccine IM (Shingrix)   I have discontinued Samuel Germany. Ruis's ALPRAZolam and azithromycin. I am also having her start on potassium  chloride SA. Additionally, I am having her maintain her acetaminophen and esomeprazole.  Meds ordered this encounter  Medications   potassium chloride SA (KLOR-CON M15) 15 MEQ tablet    Sig: Take 1 tablet (15 mEq total) by mouth 2 (two) times daily.    Dispense:  180 tablet    Refill:  0     Follow-up: Return in about 6 months (around 08/08/2021).  Scarlette Calico, MD

## 2021-02-09 DIAGNOSIS — E876 Hypokalemia: Secondary | ICD-10-CM | POA: Insufficient documentation

## 2021-02-09 LAB — HEPATIC FUNCTION PANEL
ALT: 13 U/L (ref 0–35)
AST: 16 U/L (ref 0–37)
Albumin: 4.2 g/dL (ref 3.5–5.2)
Alkaline Phosphatase: 94 U/L (ref 39–117)
Bilirubin, Direct: 0.1 mg/dL (ref 0.0–0.3)
Total Bilirubin: 0.8 mg/dL (ref 0.2–1.2)
Total Protein: 7.9 g/dL (ref 6.0–8.3)

## 2021-02-09 LAB — LIPID PANEL
Cholesterol: 190 mg/dL (ref 0–200)
HDL: 68 mg/dL (ref >39.00)
LDL Cholesterol: 104 mg/dL — ABNORMAL HIGH (ref 0–99)
NonHDL: 121.64
Total CHOL/HDL Ratio: 3
Triglycerides: 87 mg/dL (ref 0.0–149.0)
VLDL: 17.4 mg/dL (ref 0.0–40.0)

## 2021-02-09 LAB — CBC WITH DIFFERENTIAL/PLATELET
Basophils Absolute: 0.1 10*3/uL (ref 0.0–0.1)
Basophils Relative: 1.7 % (ref 0.0–3.0)
Eosinophils Absolute: 0.1 10*3/uL (ref 0.0–0.7)
Eosinophils Relative: 0.7 % (ref 0.0–5.0)
HCT: 40.6 % (ref 36.0–46.0)
Hemoglobin: 13.3 g/dL (ref 12.0–15.0)
Lymphocytes Relative: 33.9 % (ref 12.0–46.0)
Lymphs Abs: 2.7 10*3/uL (ref 0.7–4.0)
MCHC: 32.9 g/dL (ref 30.0–36.0)
MCV: 89.6 fl (ref 78.0–100.0)
Monocytes Absolute: 0.5 10*3/uL (ref 0.1–1.0)
Monocytes Relative: 6.3 % (ref 3.0–12.0)
Neutro Abs: 4.5 10*3/uL (ref 1.4–7.7)
Neutrophils Relative %: 57.4 % (ref 43.0–77.0)
Platelets: 462 10*3/uL — ABNORMAL HIGH (ref 150.0–400.0)
RBC: 4.53 Mil/uL (ref 3.87–5.11)
RDW: 14.8 % (ref 11.5–15.5)
WBC: 7.9 10*3/uL (ref 4.0–10.5)

## 2021-02-09 LAB — BASIC METABOLIC PANEL
BUN: 10 mg/dL (ref 6–23)
CO2: 27 mEq/L (ref 19–32)
Calcium: 9.1 mg/dL (ref 8.4–10.5)
Chloride: 99 mEq/L (ref 96–112)
Creatinine, Ser: 0.78 mg/dL (ref 0.40–1.20)
GFR: 87.95 mL/min (ref >60.00)
Glucose, Bld: 79 mg/dL (ref 70–99)
Potassium: 3.4 mEq/L — ABNORMAL LOW (ref 3.5–5.1)
Sodium: 136 mEq/L (ref 135–145)

## 2021-02-09 LAB — HEPATITIS C ANTIBODY
Hepatitis C Ab: NONREACTIVE
SIGNAL TO CUT-OFF: 0.13 (ref <1.00)

## 2021-02-09 LAB — IBC + FERRITIN
Ferritin: 52.8 ng/mL (ref 10.0–291.0)
Iron: 47 ug/dL (ref 42–145)
Saturation Ratios: 13.1 % — ABNORMAL LOW (ref 20.0–50.0)
TIBC: 359.8 ug/dL (ref 250.0–450.0)
Transferrin: 257 mg/dL (ref 212.0–360.0)

## 2021-02-09 LAB — FOLATE: Folate: 12.1 ng/mL (ref >5.9)

## 2021-02-09 LAB — VITAMIN B12: Vitamin B-12: 241 pg/mL (ref 211–911)

## 2021-02-09 MED ORDER — POTASSIUM CHLORIDE CRYS ER 15 MEQ PO TBCR: 15.0000 meq | EXTENDED_RELEASE_TABLET | Freq: Two times a day (BID) | ORAL | 0 refills | Status: DC

## 2021-02-16 ENCOUNTER — Encounter: Payer: Self-pay | Admitting: Orthopaedic Surgery

## 2021-02-16 ENCOUNTER — Ambulatory Visit: Payer: 59 | Admitting: Orthopaedic Surgery

## 2021-02-16 ENCOUNTER — Ambulatory Visit: Payer: Self-pay

## 2021-02-16 ENCOUNTER — Other Ambulatory Visit: Payer: Self-pay

## 2021-02-16 VITALS — Ht 65.0 in | Wt 202.0 lb

## 2021-02-16 DIAGNOSIS — M25552 Pain in left hip: Secondary | ICD-10-CM

## 2021-02-16 DIAGNOSIS — M25561 Pain in right knee: Secondary | ICD-10-CM | POA: Diagnosis not present

## 2021-02-16 DIAGNOSIS — G8929 Other chronic pain: Secondary | ICD-10-CM

## 2021-02-16 NOTE — Progress Notes (Signed)
Office Visit Note   Patient: Rhonda [name]           Date of Birth: 22-Mar-1970           MRN: [id number] Visit Date: 02/16/2021              Requested by: Janith Lima, MD [address] [place],  [place] [address] PCP: Janith Lima, MD   Assessment & Plan: Visit Diagnoses: No diagnosis found.  Plan: Pleasant 51 year old woman with a 8-year history of anterior knee pain especially when going up stairs.  She relates this to her fall onto her anterior knee onto concrete.  She did not have any treatment at that time.  Also complaining of left hip pain times several years.  This is in her hip radiates down her leg.  Denies any loss of bowel or bladder control.  She did see her primary care doctor for back issues.  MRI of her lumbar spine did show some small disc protrusion on the left at Y7-7 certainly coordinates with some of her symptoms as well as some degenerative changes in the spine.  Her x-rays of both her hip and knee are reassuring today.  We have recommended referral to physical therapy for a back stabilization program as well as a knee strengthening program focused on her chondromalacia patella she could learn a home exercise program as she does work out at a gym  Follow-Up Instructions: No follow-ups on file.   Orders:  No orders of the defined types were placed in this encounter.  No orders of the defined types were placed in this encounter.     Procedures: No procedures performed   Clinical Data: No additional findings.   Subjective: Chief Complaint  Patient presents with   Left Knee - Pain   Right Knee - Pain  Patient presents today for her right knee and left hip. She said that her right knee has hurt for 8 years following a fall. She only has pain with stairs. The pain is located anteriorly. She does experience some stiffness. The left hip and lower back has been hurting her for at least 10years. She states that her pain is located in her left groin and  radiates around her hip and down her leg. She has numbness and tingling in her left leg. She cannot sleep at night. She has seen her PCP for her back issues in the past. She takes Tylenol as needed.     Review of Systems  All other systems reviewed and are negative.   Objective: Vital Signs: LMP 01/24/2021   Physical Exam Constitutional:      Appearance: Normal appearance.  Pulmonary:     Effort: Pulmonary effort is normal.  Skin:    General: Skin is warm and dry.  Neurological:     General: No focal deficit present.     Mental Status: She is alert and oriented to person, place, and time.    Ortho Exam Examination of her right knee she does not have an effusion redness cellulitis.  She has no tenderness around the medial lateral joint line.  No apprehension test.  She does state that when she does have pain it is over the front of her knee and she highlights the area of the patellofemoral joint.  Good varus valgus stability. Examination of her hip she has excellent range of motion of the hip internal or external without any pain whatsoever.  She has no tenderness to palpation  in her spine today.  Does not radiate into her leg.  She has a negative straight leg raise.  She has good equal strength with resisted extension flexion of her leg flexion of her hip does not produce pain in her groin or in her back. Specialty Comments:  No specialty comments available.  Imaging: No results found.   PMFS History: Patient Active Problem List   Diagnosis Date Noted   Acute hypokalemia 02/09/2021   Chronic cough 02/08/2021   Primary osteoarthritis of both knees 02/08/2021   Need for hepatitis C screening test 02/08/2021   Chronic night sweats 12/26/2019   Screen for colon cancer 07/30/2019   Psychophysiological insomnia 07/30/2019   Thrombocytosis 11/06/2017   Chiari malformation type II ([place]) 09/27/2016   Abnormal MRI of head 03/07/2016   Gastroesophageal reflux disease without  esophagitis 08/19/2015   IBS (irritable bowel syndrome) 08/19/2015   Routine general medical examination at a health care facility 07/14/2014   Vitamin B 12 deficiency 12/08/2008   CARPAL TUNNEL SYNDROME 05/14/2007   DE QUERVAIN'S TENOSYNOVITIS 05/14/2007   Allergic rhinitis 01/31/2007   Past Medical History:  Diagnosis Date   Allergic rhinitis    Allergy    B12 deficiency    Carpal tunnel syndrome    Dysfunction of eustachian tube     Family History  Problem Relation Age of Onset   Heart disease Father    Hypertension Father    Diabetes Father    Hyperlipidemia Mother    Hypertension Mother    Heart disease Maternal Grandmother    Diabetes Brother    Headache Neg Hx    Colon cancer Neg Hx    Prostate cancer Neg Hx    Rectal cancer Neg Hx    Stomach cancer Neg Hx    Esophageal cancer Neg Hx     Past Surgical History:  Procedure Laterality Date   BUNIONECTOMY     right    CHOLECYSTECTOMY N/A 09/25/2015   Procedure: LAPAROSCOPIC CHOLECYSTECTOMY WITH INTRAOPERATIVE CHOLANGIOGRAM;  Surgeon: Alphonsa Overall, MD;  Location: [facility];  Service: General;  Laterality: N/A;   TONSILLECTOMY     Social History   Occupational History   Occupation: Personal assistant  Tobacco Use   Smoking status: Never   Smokeless tobacco: Never  Vaping Use   Vaping Use: Never used  Substance and Sexual Activity   Alcohol use: No   Drug use: No   Sexual activity: Not on file

## 2021-02-23 ENCOUNTER — Ambulatory Visit: Payer: 59 | Admitting: Physical Therapy

## 2021-02-23 ENCOUNTER — Encounter: Payer: Self-pay | Admitting: Physical Therapy

## 2021-02-23 ENCOUNTER — Other Ambulatory Visit: Payer: Self-pay

## 2021-02-23 DIAGNOSIS — M545 Low back pain, unspecified: Secondary | ICD-10-CM

## 2021-02-23 DIAGNOSIS — G8929 Other chronic pain: Secondary | ICD-10-CM | POA: Diagnosis not present

## 2021-02-23 DIAGNOSIS — M25552 Pain in left hip: Secondary | ICD-10-CM | POA: Diagnosis not present

## 2021-02-23 DIAGNOSIS — M25561 Pain in right knee: Secondary | ICD-10-CM | POA: Diagnosis not present

## 2021-02-23 NOTE — Therapy (Addendum)
OUTPATIENT PHYSICAL THERAPY LOWER EXTREMITY EVALUATION/Discharge PHYSICAL THERAPY DISCHARGE SUMMARY  Visits from Start of Care: 1  Current functional level related to goals / functional outcomes: See below   Remaining deficits: See below   Education / Equipment: HEP  Plan: Patient is being discharged due to not returning to PT  Elsie Ra, PT, DPT 06/29/21 2:02 PM      Patient Name: Rhonda [name] MRN: [id number] DOB:July 02, 1970, 51 y.o., female Today's Date: 02/23/2021   PT End of Session - 02/23/21 1639     Visit Number 1    Number of Visits 12    Date for PT Re-Evaluation 05/18/21   longer POC written as she may not be able to attend PT frequently due to work conflicts   [place]    PT Start Time 1520    PT Stop Time 1610    PT Time Calculation (min) 50 min    Activity Tolerance Patient tolerated treatment well    Behavior During Therapy WFL for tasks assessed/performed             Past Medical History:  Diagnosis Date   Allergic rhinitis    Allergy    B12 deficiency    Carpal tunnel syndrome    Dysfunction of eustachian tube    Past Surgical History:  Procedure Laterality Date   BUNIONECTOMY     right    CHOLECYSTECTOMY N/A 09/25/2015   Procedure: LAPAROSCOPIC CHOLECYSTECTOMY WITH INTRAOPERATIVE CHOLANGIOGRAM;  Surgeon: Alphonsa Overall, MD;  Location: [facility];  Service: General;  Laterality: N/A;   TONSILLECTOMY     Patient Active Problem List   Diagnosis Date Noted   Acute hypokalemia 02/09/2021   Chronic cough 02/08/2021   Primary osteoarthritis of both knees 02/08/2021   Need for hepatitis C screening test 02/08/2021   Chronic night sweats 12/26/2019   Screen for colon cancer 07/30/2019   Psychophysiological insomnia 07/30/2019   Thrombocytosis 11/06/2017   Chiari malformation type II ([place]) 09/27/2016   Abnormal MRI of head 03/07/2016   Gastroesophageal reflux disease without esophagitis 08/19/2015   IBS (irritable bowel  syndrome) 08/19/2015   Routine general medical examination at a health care facility 07/14/2014   Vitamin B 12 deficiency 12/08/2008   CARPAL TUNNEL SYNDROME 05/14/2007   DE QUERVAIN'S TENOSYNOVITIS 05/14/2007   Allergic rhinitis 01/31/2007    PCP: Janith Lima, MD  REFERRING PROVIDER: Persons, Stanton Kidney, [place]  REFERRING DIAG: Chronic pain of right knee,  Pain in left hip, Low back pain  THERAPY DIAG:  Chronic pain of right knee  Pain in left hip  Chronic bilateral low back pain, unspecified whether sciatica present  ONSET DATE: 8 year history of Rt knee pain, 10 year history of Lt hip and back pain  SUBJECTIVE:   SUBJECTIVE STATEMENT: Patient presents today for her right knee and left hip. She said that her right knee and Lt hip has hurt for 8 years following a falls. Left hip and lower back has been hurting her for at least 10 years and feels connected. She states that her pain is located in her left groin and radiates around her hip and down her leg. She denies tingling in her legs but says she has numbness in both of her feet.  She cannot sleep at night and wakes every 2 hours due to pain which is her biggest complaint.   PERTINENT HISTORY: SHF:WYOV and lumbar OA, chronic pain, lumbar disc protrusion  PAIN:  Are you having pain? Yes NPRS  scale: Rt knee 0/10 (mostly on bothered by stairs or prolonged standing), Lt hip pain is 10/10, and low back pain is 10/10 Pain location: Rt anterior knee, Lt hip, low back Pain description: constant sharp Aggravating factors: stairs, laying down, standing more than 30 minutes Relieving factors: creams  PRECAUTIONS: None  WEIGHT BEARING RESTRICTIONS No  FALLS:  Has patient fallen in last 6 months? No, Number of falls: 0  LIVING ENVIRONMENT: Lives with: lives with their family Lives in: House/apartment Stairs: No;    OCCUPATION: Social services  PLOF: Independent  PATIENT GOALS : to learn if movement and exercise can help with  pain, sleep with less pain   OBJECTIVE:   DIAGNOSTIC FINDINGS: XR R knee "Radiographs of her right knee demonstrate well-preserved joint spacing in the medial lateral and patellofemoral joint.  No osteophyte or cyst  sclerotic areas noted.  Well-maintained alignment.  No acute osseous  injuries.  No noted subluxation of the patella"  PATIENT SURVEYS:  FOTO 46% limited, goal is 52%  COGNITION:  Overall cognitive status: Within functional limits for tasks assessed     SENSATION:  Light touch: Appears intact    MUSCLE LENGTH: Tight Hamstrings: Right 45 deg; Left 45 deg   POSTURE:  Lt hip higher in standing, slumped posture  PALPATION: Tender to palpation, pain and trigger points  in glutes and over Lt greater trochanter and at L4-5   LE AROM/PROM: Lumbar ROM is [facility] but painful into extension, and sidebend bilat  A/PROM Right 02/23/2021 Left 02/23/2021  Hip flexion [facility]  Hip extension    Hip abduction [facility]  Hip adduction    Hip internal rotation 40 30   Hip external rotation    Knee flexion [facility] WFL  Knee extension [facility]   Ankle dorsiflexion    Ankle plantarflexion    Ankle inversion    Ankle eversion     (Blank rows = not tested)  LE MMT:  MMT Right 02/23/2021 Left 02/23/2021  Hip flexion 5 5  Hip extension 4 4  Hip abduction 5 5  Hip adduction    Hip internal rotation 5 4  Hip external rotation 4+ 4+  Knee flexion 5 4+  Knee extension 5 4+  Ankle dorsiflexion    Ankle plantarflexion    Ankle inversion    Ankle eversion     (Blank rows = not tested)  LOWER EXTREMITY SPECIAL TESTS:  +FABERS and FADIR test for Lt intrarticular hip pathology Negative Slump test and SLR for lumbar but did have hamstring pain with SLR    GAIT: WFL    TODAY'S TREATMENT: Reviewed HEP and used moist heat combined with IFC Estim at intensity to her tolerance to lumbar and Lt hip X 10 min   PATIENT EDUCATION:  Education details: HEP,POC,TENS therapy Person  educated: Patient Education method: Consulting civil engineer, Demonstration, Verbal cues, and Handouts Education comprehension: verbalized understanding and needs further education   HOME EXERCISE PROGRAM: Access Code: [email or web address] URL: [email or web address] Date: 02/23/2021 Prepared by: Elsie Ra  Exercises Standing Lumbar Extension at [place] - 2 x daily - 6 x weekly - 1-2 sets - 10 reps Supine Hamstring Stretch with Strap - 2 x daily - 6 x weekly - 1 sets - 3 reps - 30 hold Supine Bridge - 2 x daily - 6 x weekly - 1-2 sets - 10 reps - 5 hold Seated Piriformis Stretch - 2 x daily - 6 x weekly - 1 sets - 2 reps -  30 hold Hip Abduction with Resistance Loop - 2 x daily - 6 x weekly - 1-2 sets - 15-20 reps Hip Extension with Resistance Loop - 2 x daily - 6 x weekly - 1-2 sets - 15-20 reps   ASSESSMENT:  CLINICAL IMPRESSION: Patient presents with signs and symptoms consistent with chronic Lt hip pain, low back pain, and Rt knee pain. Her left hip pain appears to be more referred pain than radicular pain upon examination today as she did not report radicular symptoms and both SLR and slump tests were negative. She has have signs of Lt hip glute tendonopathy with triggerpoints and hip weakness. Objective impairments include decreased activity tolerance, difficulty walking, decreased endurance, decreased mobility, decreased ROM, decreased strength, impaired flexibility, impaired UE/LE use, postural dysfunction, and pain. These impairments are limiting patient from activity/participation in bending, lifting, carry, locomotion, cleaning, community activity, sleeping and occupation. Personal factors and comorbidities including OA and chronic pain are also affecting patient's functional outcome. Patient will benefit from skilled PT to address above impairments and improve overall function.  REHAB POTENTIAL: Good  CLINICAL DECISION MAKING: stable/uncomplicated  EVALUATION COMPLEXITY:  Low   GOALS: Short term PT Goals (target date for Short term goals are 4 weeks 03/23/21) Pt will be I and compliant with HEP. Baseline:  Goal status: New Pt will decrease pain by 50% overall to less than 5 with sleeping Baseline:10 Goal status: New  Long term PT goals (target dates for all long term goals are 12 weeks 05/18/21) Pt will improve sleeping quality due to decreased pain and be able to sleep at least 6 hours at a time. Baseline: Goal status: New Pt will improve bilat hip/knee strength to at least 5-/5 MMT to improve functional strength Baseline: Goal status: New Pt will improve FOTO to at least 52% fucntonal to show improved function Baseline: Goal status: New Pt will reduce pain by overall 50% overall with usual activity Baseline: Goal status: New   PLAN: PT FREQUENCY: 1-2 times per week   PT DURATION: 12 weeks  PLANNED INTERVENTIONS (unless contraindicated): aquatic PT,  cryotherapy, Electrical stimulation, Iontophoresis with 4 mg/ml dexamethasome, Moist heat, traction, Ultrasound, gait training, Therapeutic exercise, balance training, neuromuscular re-education, patient/family education, prosthetic training, manual techniques, passive ROM, dry needling, taping, vasopnuematic device, vestibular, spinal manipulations, joint manipulations  PLAN FOR NEXT SESSION: Review and update HEP, needs left hip strengthening and stretching  Debbe Odea, PT 02/23/2021, 4:41 PM

## 2021-03-16 NOTE — Progress Notes (Signed)
Subjective:    Patient ID: Rhonda [name], female    DOB: 02-09-1970, 51 y.o.   MRN: [id number]  This visit occurred during the SARS-CoV-2 public health emergency.  Safety protocols were in place, including screening questions prior to the visit, additional usage of staff PPE, and extensive cleaning of exam room while observing appropriate contact time as indicated for disinfecting solutions.    HPI The patient is here for an acute visit.  Headache, fever, diarrhea x 5 days.  Home COVID test - 03/12/2021.  Her symptoms started 2/17.  She had a lot of diarrhea w/o blood in the stool.  She had a lot of abdominal cramping.  She had some nausea, headaches, fever, body aches.  She was drinking Gatorade and Pedialyte.  She did try taking Kaopectate, which did not help.  She feels good this morning.  Ate applesauce and water today.  She is still fatigued.   Medications and allergies reviewed with patient and updated if appropriate.  Patient Active Problem List   Diagnosis Date Noted   Acute hypokalemia 02/09/2021   Chronic cough 02/08/2021   Primary osteoarthritis of both knees 02/08/2021   Need for hepatitis C screening test 02/08/2021   Chronic night sweats 12/26/2019   Screen for colon cancer 07/30/2019   Psychophysiological insomnia 07/30/2019   Thrombocytosis 11/06/2017   Chiari malformation type II ([place]) 09/27/2016   Abnormal MRI of head 03/07/2016   Gastroesophageal reflux disease without esophagitis 08/19/2015   IBS (irritable bowel syndrome) 08/19/2015   Routine general medical examination at a health care facility 07/14/2014   Vitamin B 12 deficiency 12/08/2008   CARPAL TUNNEL SYNDROME 05/14/2007   DE QUERVAIN'S TENOSYNOVITIS 05/14/2007   Allergic rhinitis 01/31/2007    Current Outpatient Medications on File Prior to Visit  Medication Sig Dispense Refill   acetaminophen (TYLENOL) 325 MG tablet Take 325 mg by mouth every 4 (four) hours as needed for fever.       esomeprazole (NEXIUM) 20 MG capsule Take 20 mg by mouth daily at 12 noon.     potassium chloride SA (KLOR-CON M15) 15 MEQ tablet Take 1 tablet (15 mEq total) by mouth 2 (two) times daily. 180 tablet 0   No current facility-administered medications on file prior to visit.    Past Medical History:  Diagnosis Date   Allergic rhinitis    Allergy    B12 deficiency    Carpal tunnel syndrome    Dysfunction of eustachian tube     Past Surgical History:  Procedure Laterality Date   BUNIONECTOMY     right    CHOLECYSTECTOMY N/A 09/25/2015   Procedure: LAPAROSCOPIC CHOLECYSTECTOMY WITH INTRAOPERATIVE CHOLANGIOGRAM;  Surgeon: Alphonsa Overall, MD;  Location: [facility];  Service: General;  Laterality: N/A;   TONSILLECTOMY      Social History   Socioeconomic History   Marital status: Single    Spouse name: Not on file   Number of children: 0   Years of education: Assoc   Highest education level: Not on file  Occupational History   Occupation: Personal assistant  Tobacco Use   Smoking status: Never   Smokeless tobacco: Never  Vaping Use   Vaping Use: Never used  Substance and Sexual Activity   Alcohol use: No   Drug use: No   Sexual activity: Not on file  Other Topics Concern   Not on file  Social History Narrative   College grad   Single   Bought her own home in '  09   Occupation: Sales promotion account executive   Fun: Resting, yard work   Denies religious beliefs effecting health care.    Drinks caffeine daily    Social Determinants of Radio broadcast assistant Strain: Not on file  Food Insecurity: Not on file  Transportation Needs: Not on file  Physical Activity: Not on file  Stress: Not on file  Social Connections: Not on file    Family History  Problem Relation Age of Onset   Heart disease Father    Hypertension Father    Diabetes Father    Hyperlipidemia Mother    Hypertension Mother    Heart disease Maternal Grandmother    Diabetes Brother    Headache Neg Hx    Colon  cancer Neg Hx    Prostate cancer Neg Hx    Rectal cancer Neg Hx    Stomach cancer Neg Hx    Esophageal cancer Neg Hx     Review of Systems  Constitutional:  Positive for fever.  HENT:  Negative for congestion and sore throat.   Respiratory:  Negative for cough.   Gastrointestinal:  Positive for diarrhea and nausea (yesterday). Negative for abdominal pain, blood in stool and vomiting.       No gerd  Musculoskeletal:  Positive for myalgias.  Neurological:  Positive for headaches. Negative for dizziness and light-headedness.      Objective:   Vitals:   03/17/21 0751  BP: 116/80  Pulse: 83  Temp: 98.3 F (36.8 C)  SpO2: 99%   BP Readings from Last 3 Encounters:  03/17/21 116/80  02/08/21 118/64  09/22/20 128/76   Wt Readings from Last 3 Encounters:  03/17/21 202 lb (91.6 kg)  02/16/21 202 lb (91.6 kg)  02/08/21 206 lb (93.4 kg)   Body mass index is 33.61 kg/m.   Physical Exam Constitutional:      General: She is not in acute distress.    Appearance: Normal appearance. She is not ill-appearing.  HENT:     Head: Normocephalic.  Eyes:     Conjunctiva/sclera: Conjunctivae normal.  Cardiovascular:     Rate and Rhythm: Normal rate and regular rhythm.     Heart sounds: No murmur heard. Pulmonary:     Effort: Pulmonary effort is normal. No respiratory distress.     Breath sounds: Normal breath sounds. No wheezing or rales.  Abdominal:     General: There is no distension.     Palpations: Abdomen is soft.     Tenderness: There is no abdominal tenderness. There is no guarding or rebound.  Musculoskeletal:     Right lower leg: No edema.     Left lower leg: No edema.  Skin:    General: Skin is warm and dry.     Findings: No rash.  Neurological:     Mental Status: She is alert.           Assessment & Plan:    Viral gastroenteritis: Acute Symptoms consistent with viral gastroenteritis Symptoms are much better Hopefully will continue to improve She still  has a little bit of nausea and I will prescribe her Zofran 4 mg every 8 hours as needed She still has some fatigue, which should improve over the next few days with rest, fluids and increasing her oral intake Bowel movements are not back to normal.  Has not had a bowel movement today.  Discussed it may take a few days to return to normal Advised bland diet-advanced as tolerated Stressed  continuing increased fluids-most likely she is still a little dehydrated Call with any questions or if her symptoms do not continue to improve

## 2021-03-17 ENCOUNTER — Ambulatory Visit: Payer: 59 | Admitting: Internal Medicine

## 2021-03-17 ENCOUNTER — Encounter: Payer: Self-pay | Admitting: Internal Medicine

## 2021-03-17 ENCOUNTER — Other Ambulatory Visit: Payer: Self-pay

## 2021-03-17 VITALS — BP 116/80 | HR 83 | Temp 98.3°F | Ht 65.0 in | Wt 202.0 lb

## 2021-03-17 DIAGNOSIS — A084 Viral intestinal infection, unspecified: Secondary | ICD-10-CM | POA: Diagnosis not present

## 2021-03-17 MED ORDER — ONDANSETRON HCL 4 MG PO TABS: 4.0000 mg | ORAL_TABLET | Freq: Three times a day (TID) | ORAL | 0 refills | Status: DC | PRN

## 2021-03-17 NOTE — Patient Instructions (Addendum)
Medications changes include :  zofran for nausea    Your prescription(s) have been sent to your pharmacy.     Please call if there is no improvement in your symptoms.     Viral Gastroenteritis, Adult Viral gastroenteritis is also known as the stomach flu. This condition may affect your stomach, your small intestine, and your large intestine. It can cause sudden watery poop (diarrhea), fever, and throwing up (vomiting). This condition is caused by certain germs (viruses). These germs can be passed from person to person very easily (are contagious). Having watery poop and throwing up can make you feel weak and cause you to not have enough water in your body (get dehydrated). This can make you tired and thirsty, make you have a dry mouth, and make it so you pee (urinate) less often. It is important to replace the fluids that you lose from having watery poop and throwing up. What are the causes? You can get sick by catching viruses from other people. You can also get sick by: Eating food, drinking water, or touching a surface that has the viruses on it (is contaminated). Sharing utensils or other personal items with a person who is sick. What increases the risk? Having a weak body defense system (immune system). Living with one or more children who are younger than 20 years old. Living in a nursing home. Going on cruise ships. What are the signs or symptoms? Symptoms of this condition start suddenly. Symptoms may last for a few days or for as long as a week. Common symptoms include: Watery poop. Throwing up. Other symptoms include: Fever. Headache. Feeling tired (fatigue). Pain in the belly (abdomen). Chills. Feeling weak. Feeling sick to your stomach (nauseous). Muscle aches. Not feeling hungry. How is this treated? This condition typically goes away on its own. The focus of treatment is to replace the fluids that you lose. This condition may be treated with: An ORS (oral  rehydration solution). This is a drink that is sold at pharmacies and stores. Medicines to help with your symptoms. Probiotic supplements to reduce symptoms of diarrhea. Fluids given through an IV tube, if needed. Older adults and people with other diseases or a weak body defense system are at higher risk for not having enough water in the body. Follow these instructions at home: Eating and drinking  Take an ORS as told by your doctor. Drink clear fluids in small amounts as you are able. Clear fluids include: Water. Ice chips. Fruit juice with water added to it (diluted). Low-calorie sports drinks. Drink enough fluid to keep your pee (urine) pale yellow. Eat small amounts of healthy foods every 3-4 hours as you are able. This may include whole grains, fruits, vegetables, lean meats, and yogurt. Avoid fluids that have a lot of sugar or caffeine in them, such as energy drinks, sports drinks, and soda. Avoid spicy or fatty foods. Avoid alcohol. General instructions  Wash your hands often. This is very important after you have watery poop or you throw up. If you cannot use soap and water, use hand sanitizer. Make sure that all people in your home wash their hands well and often. Take over-the-counter and prescription medicines only as told by your doctor. Rest at home while you get better. Watch your condition for any changes. Take a warm bath to help with any burning or pain from having watery poop. Keep all follow-up visits as told by your doctor. This is important. Contact a doctor if: You  cannot keep fluids down. Your symptoms get worse. You have new symptoms. You feel light-headed. You feel dizzy. You have muscle cramps. Get help right away if: You have chest pain. You feel very weak. You pass out (faint). You see blood in your throw-up. Your throw-up looks like coffee grounds. You have bloody or black poop (stools) or poop that looks like tar. You have a very bad headache,  or a stiff neck, or both. You have a rash. You have very bad pain, cramping, or bloating in your belly. You have trouble breathing. You are breathing very quickly. You have a fast heartbeat. Your skin feels cold and clammy. You feel mixed up (confused). You have pain when you pee. You have signs of not having enough water in the body, such as: Dark pee, hardly any pee, or no pee. Cracked lips. Dry mouth. Sunken eyes. Feeling very sleepy. Feeling weak. Summary Viral gastroenteritis is also known as the stomach flu. This condition can cause sudden watery poop (diarrhea), fever, and throwing up (vomiting). These germs can be passed from person to person very easily. Take an ORS as told by your doctor. This is a drink that is sold at pharmacies and stores. Drink fluids in small amounts many times each day as you are able. This information is not intended to replace advice given to you by your health care provider. Make sure you discuss any questions you have with your health care provider. Document Revised: 11/15/2017 Document Reviewed: 11/15/2017 Elsevier Patient Education  2022 [employer].

## 2021-03-22 NOTE — Progress Notes (Signed)
Synopsis: Referred for chronic cough by Janith Lima, MD  Subjective:   PATIENT ID: Rhonda [name] GENDER: female DOB: 02/17/1970, MRN: [id number]  Chief Complaint  Patient presents with   Pulmonary Consult    Referred by Dr. Scarlette Calico. Pt c/o cough since had Covid PNA Aug 2020. Cough is non prod. Sometimes cough wakes her up in the night.    51yF with history of AR, eustachian tube dysfunction, covid-19 infection 2020 - wasn't hospitalized with severe symptoms referred for chronic cough  When she had covid-19 infection she got 2 rounds of prednisone, hadn't had opportunity yet to be vaccinated either. Was out of work for a month.   Since then has had cough. Dry cough. No chest congestion. No postnasal drainage. She does have heartburn and is supposed to be on nexium she says.   She has had skin testing for allergy 2 ya with a lot of positive results.    She has no DOE. She says she's just overweight.   Seen by IM yesterday for suspected viral ge. This has resolved.  Otherwise pertinent review of systems is negative.  Father with COPD.   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 Never smoker, no vaping, no MJ. No pets at home.   Past Medical History:  Diagnosis Date   Allergic rhinitis    Allergy    B12 deficiency    Carpal tunnel syndrome    Dysfunction of eustachian tube      Family History  Problem Relation Age of Onset   Heart disease Father    Hypertension Father    Diabetes Father    Hyperlipidemia Mother    Hypertension Mother    Heart disease Maternal Grandmother    Diabetes Brother    Headache Neg Hx    Colon cancer Neg Hx    Prostate cancer Neg Hx    Rectal cancer Neg Hx    Stomach cancer Neg Hx    Esophageal cancer Neg Hx      Past Surgical History:  Procedure Laterality Date   BUNIONECTOMY     right    CHOLECYSTECTOMY N/A 09/25/2015   Procedure: LAPAROSCOPIC CHOLECYSTECTOMY WITH INTRAOPERATIVE CHOLANGIOGRAM;  Surgeon: Alphonsa Overall, MD;  Location: [facility];  Service: General;  Laterality: N/A;   TONSILLECTOMY      Social History   Socioeconomic History   Marital status: Single    Spouse name: Not on file   Number of children: 0   Years of education: Assoc   Highest education level: Not on file  Occupational History   Occupation: Personal assistant  Tobacco Use   Smoking status: Never   Smokeless tobacco: Never  Vaping Use   Vaping Use: Never used  Substance and Sexual Activity   Alcohol use: No   Drug use: No   Sexual activity: Not on file  Other Topics Concern   Not on file  Social History Narrative   College grad   Single   Bought her own home in '09   Occupation: Programme researcher, broadcasting/film/video and cleaning   Fun: Resting, yard work   Denies religious beliefs effecting health care.    Drinks caffeine daily    Social Determinants of Health   Financial Resource Strain: Not on file  Food Insecurity: Not on file  Transportation Needs: Not on file  Physical Activity: Not on file  Stress: Not on file  Social Connections: Not on file  Intimate Partner Violence: Not on file  Allergies  Allergen Reactions   Bactrim Ds [Sulfamethoxazole-Trimethoprim] Other (See Comments)    Blisters    Cephalosporins     Hives    Oseltamivir Phosphate Swelling   Amoxicillin Hives and Rash    Has patient had a PCN reaction causing immediate rash, facial/tongue/throat swelling, SOB or lightheadedness with hypotension: Yes Has patient had a PCN reaction causing severe rash involving mucus membranes or skin necrosis: Yes Has patient had a PCN reaction that required hospitalization No Has patient had a PCN reaction occurring within the last 10 years: Yes If all of the above answers are "NO", then may proceed with Cephalosporin use.    Doxycycline Rash   Fluconazole Rash    Added blisters      Outpatient Medications Prior to Visit  Medication Sig Dispense Refill   acetaminophen (TYLENOL) 325 MG tablet Take 325 mg by mouth every 4 (four) hours as  needed for fever.      COLLAGEN PO Take 1 tablet by mouth daily.     esomeprazole (NEXIUM) 20 MG capsule Take 20 mg by mouth daily at 12 noon.     Fish Oil-Cholecalciferol (OMEGA-3 + VITAMIN D3 PO) Take 1 tablet by mouth daily.     potassium chloride SA (KLOR-CON M15) 15 MEQ tablet Take 1 tablet (15 mEq total) by mouth 2 (two) times daily. 180 tablet 0   ondansetron (ZOFRAN) 4 MG tablet Take 1 tablet (4 mg total) by mouth every 8 (eight) hours as needed for nausea or vomiting. 20 tablet 0   No facility-administered medications prior to visit.       Objective:   Physical Exam:  General appearance: 51 y.o., female, NAD, conversant  Eyes: anicteric sclerae; PERRL, tracking appropriately HENT: NCAT; MMM Neck: Trachea midline; no lymphadenopathy, no JVD Lungs: CTAB, no crackles, no wheeze, with normal respiratory effort CV: RRR, no murmur  Abdomen: Soft, non-tender; non-distended, BS present  Extremities: No peripheral edema, warm Skin: Normal turgor and texture; no rash Psych: Appropriate affect Neuro: Alert and oriented to person and place, no focal deficit     Vitals:   03/23/21 1458  BP: 110/68  Pulse: 89  Temp: 98.6 F (37 C)  TempSrc: Oral  SpO2: 96%  Weight: 205 lb 9.6 oz (93.3 kg)  Height: 5\' 5"  (1.651 m)   96% on RA BMI Readings from Last 3 Encounters:  03/23/21 34.21 kg/m  03/17/21 33.61 kg/m  02/16/21 33.61 kg/m   Wt Readings from Last 3 Encounters:  03/23/21 205 lb 9.6 oz (93.3 kg)  03/17/21 202 lb (91.6 kg)  02/16/21 202 lb (91.6 kg)     CBC    Component Value Date/Time   WBC 7.9 02/08/2021 1621   RBC 4.53 02/08/2021 1621   HGB 13.3 02/08/2021 1621   HGB 14.5 01/16/2019 1011   HCT 40.6 02/08/2021 1621   PLT 462.0 (H) 02/08/2021 1621   PLT 356 01/16/2019 1011   MCV 89.6 02/08/2021 1621   MCH 30.3 12/08/2019 0140   MCHC 32.9 02/08/2021 1621   RDW 14.8 02/08/2021 1621   LYMPHSABS 2.7 02/08/2021 1621   MONOABS 0.5 02/08/2021 1621   EOSABS  0.1 02/08/2021 1621   BASOSABS 0.1 02/08/2021 1621     Chest Imaging: CXR 02/08/21 reviewed by me unremarkable  Ct Chest 12/2019 reviewed by me with possible subtle peripheral and largely dependent reticular opacities which are likely atelectasis  EGD 08/2019: Gastritis, normal esophagus. Gastric polyps. Bx neg for HP  Pulmonary Functions Testing Results: No  flowsheet data found.       Assessment & Plan:   # Chronic cough Symptomatic GERD is first modifiable factor to address. Has in past however seen allergy for AR but per pt doesn't seem active right now, no postnasal drainage. Could alternatively have small airways disease either from covid or asthma.  Plan: - 8 week trial of esomeprazole 40 mg 30 min before breakfast daily - pft in 8 weeks on same day as next clinic appointment  RTC 8 weeks      Maryjane Hurter, MD [facility] 03/23/2021 3:22 PM

## 2021-03-23 ENCOUNTER — Other Ambulatory Visit: Payer: Self-pay

## 2021-03-23 ENCOUNTER — Encounter: Payer: Self-pay | Admitting: Student

## 2021-03-23 ENCOUNTER — Ambulatory Visit: Payer: 59 | Admitting: Student

## 2021-03-23 VITALS — BP 110/68 | HR 89 | Temp 98.6°F | Ht 65.0 in | Wt 205.6 lb

## 2021-03-23 DIAGNOSIS — R053 Chronic cough: Secondary | ICD-10-CM

## 2021-03-23 MED ORDER — ESOMEPRAZOLE MAGNESIUM 40 MG PO CPDR: 40.0000 mg | DELAYED_RELEASE_CAPSULE | Freq: Every day | ORAL | 0 refills | Status: DC

## 2021-03-23 MED ORDER — BENZONATATE 200 MG PO CAPS: 200.0000 mg | ORAL_CAPSULE | Freq: Three times a day (TID) | ORAL | 1 refills | Status: DC | PRN

## 2021-03-23 NOTE — Patient Instructions (Addendum)
-   esomeprazole 40 mg 30 minutes before breakfast everyday for 8 weeks - breathing tests (PFTs) - see you in 8 weeks

## 2021-04-13 ENCOUNTER — Encounter: Payer: Self-pay | Admitting: Internal Medicine

## 2021-04-13 ENCOUNTER — Other Ambulatory Visit: Payer: Self-pay

## 2021-04-13 ENCOUNTER — Ambulatory Visit: Payer: 59 | Admitting: Internal Medicine

## 2021-04-13 DIAGNOSIS — K1120 Sialoadenitis, unspecified: Secondary | ICD-10-CM | POA: Diagnosis not present

## 2021-04-13 DIAGNOSIS — Z889 Allergy status to unspecified drugs, medicaments and biological substances status: Secondary | ICD-10-CM | POA: Diagnosis not present

## 2021-04-13 MED ORDER — LORATADINE 10 MG PO TABS: 10.0000 mg | ORAL_TABLET | Freq: Every day | ORAL | 1 refills | Status: DC

## 2021-04-13 MED ORDER — TERCONAZOLE 0.8 % VA CREA: 1.0000 | TOPICAL_CREAM | Freq: Every day | VAGINAL | 0 refills | Status: DC

## 2021-04-13 MED ORDER — LEVOFLOXACIN 500 MG PO TABS: 500.0000 mg | ORAL_TABLET | Freq: Every day | ORAL | 0 refills | Status: DC

## 2021-04-13 MED ORDER — ALIGN 4 MG PO CAPS: 1.0000 | ORAL_CAPSULE | Freq: Every day | ORAL | 0 refills | Status: DC

## 2021-04-13 NOTE — Assessment & Plan Note (Addendum)
R acute ?Start Levaquin w/probiotic ?ENT ref if not well ? ?

## 2021-04-13 NOTE — Patient Instructions (Signed)
Water with lemon ? ?Use [employer] Peroxicare tooth paste ? ?

## 2021-04-13 NOTE — Assessment & Plan Note (Signed)
Discussed w/pt ?Will use Levaquin, Align, Loratidine ?

## 2021-04-13 NOTE — Progress Notes (Signed)
? ?Subjective:  ?Patient ID: Rhonda [name], female    DOB: 24-Mar-1970  Age: 51 y.o. MRN: [id number] ? ?CC: Office Visit (Knot under neck) ? ? ?HPI ?Rhonda [name] presents for swelling under chin ? ?Outpatient Medications Prior to Visit  ?Medication Sig Dispense Refill  ? acetaminophen (TYLENOL) 325 MG tablet Take 325 mg by mouth every 4 (four) hours as needed for fever.     ? benzonatate (TESSALON) 200 MG capsule Take 1 capsule (200 mg total) by mouth 3 (three) times daily as needed for cough. 30 capsule 1  ? COLLAGEN PO Take 1 tablet by mouth daily.    ? esomeprazole (NEXIUM) 40 MG capsule Take 1 capsule (40 mg total) by mouth daily at 12 noon. 90 capsule 0  ? Fish Oil-Cholecalciferol (OMEGA-3 + VITAMIN D3 PO) Take 1 tablet by mouth daily.    ? potassium chloride SA (KLOR-CON M15) 15 MEQ tablet Take 1 tablet (15 mEq total) by mouth 2 (two) times daily. 180 tablet 0  ? ?No facility-administered medications prior to visit.  ? ? ?ROS: ?Review of Systems  ?Constitutional:  Negative for activity change, appetite change, chills, fatigue, fever and unexpected weight change.  ?HENT:  Positive for facial swelling. Negative for congestion, hearing loss, mouth sores and sinus pressure.   ?Eyes:  Negative for visual disturbance.  ?Respiratory:  Negative for cough and chest tightness.   ?Gastrointestinal:  Negative for abdominal pain and nausea.  ?Genitourinary:  Negative for difficulty urinating, frequency and vaginal pain.  ?Musculoskeletal:  Negative for back pain and gait problem.  ?Skin:  Negative for pallor and rash.  ?Neurological:  Negative for dizziness, tremors, weakness, numbness and headaches.  ?Psychiatric/Behavioral:  Negative for confusion and sleep disturbance.   ? ?Objective:  ?BP 120/72 (BP Location: Right Arm, Patient Position: Sitting, Cuff Size: Large)   Pulse 81   Temp 98.9 ?F (37.2 ?C) (Oral)   Ht '5\' 5"'$  (1.651 m)   Wt 200 lb (90.7 kg)   SpO2 99%   BMI 33.28 kg/m?  ? ?BP Readings from Last 3  Encounters:  ?04/13/21 120/72  ?03/23/21 110/68  ?03/17/21 116/80  ? ? ?Wt Readings from Last 3 Encounters:  ?04/13/21 200 lb (90.7 kg)  ?03/23/21 205 lb 9.6 oz (93.3 kg)  ?03/17/21 202 lb (91.6 kg)  ? ? ?Physical Exam ?Constitutional:   ?   General: She is not in acute distress. ?   Appearance: She is well-developed.  ?HENT:  ?   Head: Normocephalic.  ?   Right Ear: External ear normal.  ?   Left Ear: External ear normal.  ?   Nose: Nose normal.  ?Eyes:  ?   General:     ?   Right eye: No discharge.     ?   Left eye: No discharge.  ?   Conjunctiva/sclera: Conjunctivae normal.  ?   Pupils: Pupils are equal, round, and reactive to light.  ?Neck:  ?   Thyroid: No thyromegaly.  ?   Vascular: No JVD.  ?   Trachea: No tracheal deviation.  ?Cardiovascular:  ?   Rate and Rhythm: Normal rate and regular rhythm.  ?   Heart sounds: Normal heart sounds.  ?Pulmonary:  ?   Effort: No respiratory distress.  ?   Breath sounds: No stridor. No wheezing.  ?Abdominal:  ?   General: Bowel sounds are normal. There is no distension.  ?   Palpations: Abdomen is soft. There is no mass.  ?  Tenderness: There is no abdominal tenderness. There is no guarding or rebound.  ?Musculoskeletal:     ?   General: No tenderness.  ?   Cervical back: Normal range of motion and neck supple. No rigidity.  ?Lymphadenopathy:  ?   Cervical: No cervical adenopathy.  ?Skin: ?   Findings: No erythema or rash.  ?Neurological:  ?   Cranial Nerves: No cranial nerve deficit.  ?   Motor: No abnormal muscle tone.  ?   Coordination: Coordination normal.  ?   Deep Tendon Reflexes: Reflexes normal.  ?Psychiatric:     ?   Behavior: Behavior normal.     ?   Thought Content: Thought content normal.     ?   Judgment: Judgment normal.  ?swelling under chin R ? ? ? A total time of 30 minutes was spent preparing to see the patient, reviewing tests, x-rays, operative reports and other medical records.  Also, obtaining history and performing comprehensive physical exam.   Additionally, counseling the patient regarding the above listed issues.   Finally, documenting clinical information in the health records, coordination of care, educating the patient re drug allergies, yeast infection, sialoadenitis. It is a complex case. ? ? ?Lab Results  ?Component Value Date  ? WBC 7.9 02/08/2021  ? HGB 13.3 02/08/2021  ? HCT 40.6 02/08/2021  ? PLT 462.0 (H) 02/08/2021  ? GLUCOSE 79 02/08/2021  ? CHOL 190 02/08/2021  ? TRIG 87.0 02/08/2021  ? HDL 68.00 02/08/2021  ? LDLCALC 104 (H) 02/08/2021  ? ALT 13 02/08/2021  ? AST 16 02/08/2021  ? NA 136 02/08/2021  ? K 3.4 (L) 02/08/2021  ? CL 99 02/08/2021  ? CREATININE 0.78 02/08/2021  ? BUN 10 02/08/2021  ? CO2 27 02/08/2021  ? TSH 0.62 04/03/2020  ? HGBA1C 5.4 04/03/2020  ? ? ?CT Chest W Contrast ? ?Result Date: 01/03/2020 ?CLINICAL DATA:  Chest wall pain. EXAM: CT CHEST WITH CONTRAST TECHNIQUE: Multidetector CT imaging of the chest was performed during intravenous contrast administration. CONTRAST:  63m OMNIPAQUE IOHEXOL 300 MG/ML  SOLN COMPARISON:  None. FINDINGS: Cardiovascular: No significant vascular findings. Normal heart size. No pericardial effusion. Mediastinum/Nodes: No enlarged mediastinal, hilar, or axillary lymph nodes. Thyroid gland, trachea, and esophagus demonstrate no significant findings. Lungs/Pleura: Mild scattered atelectasis without focal consolidation. No pleural effusions. No pneumothorax. Upper Abdomen: No acute abnormality. Musculoskeletal: No chest wall abnormality. No acute or significant osseous findings. IMPRESSION: No evidence of acute abnormality. Mild scattered atelectasis without focal consolidation. Electronically Signed   By: FMargaretha SheffieldMD   On: 01/03/2020 14:34  ? ? ?Assessment & Plan:  ? ?Problem List Items Addressed This Visit   ? ? Sialoadenitis of sublingual gland  ?  R acute ?Start Levaquin w/probiotic ?ENT ref if not well ? ?  ?  ? Multiple drug allergies  ?  Discussed w/pt ?Will use Levaquin, Align,  Loratidine ?  ?  ?  ? ? ?Meds ordered this encounter  ?Medications  ? levofloxacin (LEVAQUIN) 500 MG tablet  ?  Sig: Take 1 tablet (500 mg total) by mouth daily.  ?  Dispense:  7 tablet  ?  Refill:  0  ? loratadine (CLARITIN) 10 MG tablet  ?  Sig: Take 1 tablet (10 mg total) by mouth daily.  ?  Dispense:  30 tablet  ?  Refill:  1  ? Probiotic Product (ALIGN) 4 MG CAPS  ?  Sig: Take 1 capsule (4 mg total) by mouth daily.  ?  Dispense:  30 capsule  ?  Refill:  0  ? terconazole (TERAZOL 3) 0.8 % vaginal cream  ?  Sig: Place 1 applicator vaginally at bedtime.  ?  Dispense:  20 g  ?  Refill:  0  ?  ? ? ?Follow-up: Return in about 2 weeks (around 04/27/2021) for a follow-up visit. ? ?Walker Kehr, MD ?

## 2021-05-20 ENCOUNTER — Encounter: Payer: Self-pay | Admitting: Student

## 2021-05-20 ENCOUNTER — Ambulatory Visit (INDEPENDENT_AMBULATORY_CARE_PROVIDER_SITE_OTHER): Payer: 59 | Admitting: Student

## 2021-05-20 ENCOUNTER — Ambulatory Visit: Payer: 59 | Admitting: Student

## 2021-05-20 VITALS — BP 112/74 | HR 92 | Temp 98.1°F | Ht 65.75 in | Wt 206.0 lb

## 2021-05-20 DIAGNOSIS — R053 Chronic cough: Secondary | ICD-10-CM

## 2021-05-20 LAB — PULMONARY FUNCTION TEST
DL/VA % pred: 101 %
DL/VA: 4.32 ml/min/mmHg/L
DLCO cor % pred: 87 %
DLCO cor: 19.56 ml/min/mmHg
DLCO unc % pred: 87 %
DLCO unc: 19.56 ml/min/mmHg
FEF 25-75 Post: 3.67 L/sec
FEF 25-75 Pre: 3.88 L/sec
FEF2575-%Change-Post: -5 %
FEF2575-%Pred-Post: 144 %
FEF2575-%Pred-Pre: 152 %
FEV1-%Change-Post: -1 %
FEV1-%Pred-Post: 114 %
FEV1-%Pred-Pre: 116 %
FEV1-Post: 2.82 L
FEV1-Pre: 2.86 L
FEV1FVC-%Change-Post: 0 %
FEV1FVC-%Pred-Pre: 105 %
FEV6-%Change-Post: -1 %
FEV6-%Pred-Post: 109 %
FEV6-%Pred-Pre: 111 %
FEV6-Post: 3.27 L
FEV6-Pre: 3.32 L
FEV6FVC-%Change-Post: 0 %
FEV6FVC-%Pred-Post: 102 %
FEV6FVC-%Pred-Pre: 102 %
FVC-%Change-Post: -1 %
FVC-%Pred-Post: 106 %
FVC-%Pred-Pre: 108 %
FVC-Post: 3.27 L
FVC-Pre: 3.34 L
Post FEV1/FVC ratio: 86 %
Post FEV6/FVC ratio: 100 %
Pre FEV1/FVC ratio: 86 %
Pre FEV6/FVC Ratio: 100 %
RV % pred: 84 %
RV: 1.59 L
TLC % pred: 94 %
TLC: 5.01 L

## 2021-05-20 MED ORDER — ESOMEPRAZOLE MAGNESIUM 40 MG PO CPDR
40.0000 mg | DELAYED_RELEASE_CAPSULE | Freq: Every day | ORAL | 11 refills | Status: DC
Start: 2021-05-20 — End: 2022-09-12

## 2021-05-20 NOTE — Patient Instructions (Signed)
Food Choices for Gastroesophageal Reflux Disease, Adult When you have gastroesophageal reflux disease (GERD), the foods you eat and your eating habits are very important. Choosing the right foods can help ease the discomfort of GERD. Consider working with a dietitian to help you make healthy food choices. What are tips for following this plan? Reading food labels Look for foods that are low in saturated fat. Foods that have less than 5% of daily value (DV) of fat and 0 g of trans fats may help with your symptoms. Cooking Cook foods using methods other than frying. This may include baking, steaming, grilling, or broiling. These are all methods that do not need a lot of fat for cooking. To add flavor, try to use herbs that are low in spice and acidity. Meal planning  Choose healthy foods that are low in fat, such as fruits, vegetables, whole grains, low-fat dairy products, lean meats, fish, and poultry. Eat frequent, small meals instead of three large meals each day. Eat your meals slowly, in a relaxed setting. Avoid bending over or lying down until 2-3 hours after eating. Limit high-fat foods such as fatty meats or fried foods. Limit your intake of fatty foods, such as oils, butter, and shortening. Avoid the following as told by your health care provider: Foods that cause symptoms. These may be different for different people. Keep a food diary to keep track of foods that cause symptoms. Alcohol. Drinking large amounts of liquid with meals. Eating meals during the 2-3 hours before bed. Lifestyle Maintain a healthy weight. Ask your health care provider what weight is healthy for you. If you need to lose weight, work with your health care provider to do so safely. Exercise for at least 30 minutes on 5 or more days each week, or as told by your health care provider. Avoid wearing clothes that fit tightly around your waist and chest. Do not use any products that contain nicotine or tobacco. These  products include cigarettes, chewing tobacco, and vaping devices, such as e-cigarettes. If you need help quitting, ask your health care provider. Sleep with the head of your bed raised. Use a wedge under the mattress or blocks under the bed frame to raise the head of the bed. Chew sugar-free gum after mealtimes. What foods should I eat?  Eat a healthy, well-balanced diet of fruits, vegetables, whole grains, low-fat dairy products, lean meats, fish, and poultry. Each person is different. Foods that may trigger symptoms in one person may not trigger any symptoms in another person. Work with your health care provider to identify foods that are safe for you. The items listed above may not be a complete list of recommended foods and beverages. Contact a dietitian for more information. What foods should I avoid? Limiting some of these foods may help manage the symptoms of GERD. Everyone is different. Consult a dietitian or your health care provider to help you identify the exact foods to avoid, if any. Fruits Any fruits prepared with added fat. Any fruits that cause symptoms. For some people this may include citrus fruits, such as oranges, grapefruit, pineapple, and lemons. Vegetables Deep-fried vegetables. French fries. Any vegetables prepared with added fat. Any vegetables that cause symptoms. For some people, this may include tomatoes and tomato products, chili peppers, onions and garlic, and horseradish. Grains Pastries or quick breads with added fat. Meats and other proteins High-fat meats, such as fatty beef or pork, hot dogs, ribs, ham, sausage, salami, and bacon. Fried meat or protein, including   fried fish and fried chicken. Nuts and nut butters, in large amounts. Dairy Whole milk and chocolate milk. Sour cream. Cream. Ice cream. Cream cheese. Milkshakes. Fats and oils Butter. Margarine. Shortening. Ghee. Beverages Coffee and tea, with or without caffeine. Carbonated beverages. Sodas. Energy  drinks. Fruit juice made with acidic fruits, such as orange or grapefruit. Tomato juice. Alcoholic drinks. Sweets and desserts Chocolate and cocoa. Donuts. Seasonings and condiments Pepper. Peppermint and spearmint. Added salt. Any condiments, herbs, or seasonings that cause symptoms. For some people, this may include curry, hot sauce, or vinegar-based salad dressings. The items listed above may not be a complete list of foods and beverages to avoid. Contact a dietitian for more information. Questions to ask your health care provider Diet and lifestyle changes are usually the first steps that are taken to manage symptoms of GERD. If diet and lifestyle changes do not improve your symptoms, talk with your health care provider about taking medicines. Where to find more information [employer] G[employer]intestinal [employer]: aboutgerd.org Summary When you have gastroesophageal reflux disease (GERD), food and lifestyle choices may be very helpful in easing the discomfort of GERD. Eat frequent, small meals instead of three large meals each day. Eat your meals slowly, in a relaxed setting. Avoid bending over or lying down until 2-3 hours after eating. Limit high-fat foods such as fatty meats or fried foods. This information is not intended to replace advice given to you by your health care provider. Make sure you discuss any questions you have with your health care provider. Document Revised: 07/22/2019 Document Reviewed: 07/22/2019 Elsevier Patient Education  2023 Elsevier Inc.  

## 2021-05-20 NOTE — Progress Notes (Signed)
? ? ?Synopsis: Referred for chronic cough by Janith Lima, MD ? ?Subjective:  ? ?PATIENT ID: Rhonda [name] GENDER: female DOB: Oct 01, 1970, MRN: [id number] ? ?Chief Complaint  ?Patient presents with  ? Follow-up  ?  PFT's done today. Cough has pretty much resolved.   ? ?51yF with history of AR, eustachian tube dysfunction, covid-19 infection 2020 - wasn't hospitalized with severe symptoms referred for chronic cough ? ?When she had covid-19 infection she got 2 rounds of prednisone, hadn't had opportunity yet to be vaccinated either. Was out of work for a month.  ? ?Since then has had cough. Dry cough. No chest congestion. No postnasal drainage. She does have heartburn and is supposed to be on nexium she says.  ? ?She has had skin testing for allergy 2 ya with a lot of positive results.  ? ?She has no DOE. She says she's just overweight.  ? ?Seen by IM yesterday for suspected viral ge. This has resolved ? ?Father with COPD.  ? ?She is a case worker at [employer] Never smoker, no vaping, no MJ. No pets at home.  ? ?Interval HPI: ? ?Doing much better, cough essentially resolved on ppi. PFTs today normal.  ? ?Otherwise pertinent review of systems is negative. ?Past Medical History:  ?Diagnosis Date  ? Allergic rhinitis   ? Allergy   ? B12 deficiency   ? Carpal tunnel syndrome   ? Dysfunction of eustachian tube   ?  ? ?Family History  ?Problem Relation Age of Onset  ? Heart disease Father   ? Hypertension Father   ? Diabetes Father   ? Hyperlipidemia Mother   ? Hypertension Mother   ? Heart disease Maternal Grandmother   ? Diabetes Brother   ? Headache Neg Hx   ? Colon cancer Neg Hx   ? Prostate cancer Neg Hx   ? Rectal cancer Neg Hx   ? Stomach cancer Neg Hx   ? Esophageal cancer Neg Hx   ?  ? ?Past Surgical History:  ?Procedure Laterality Date  ? BUNIONECTOMY    ? right   ? CHOLECYSTECTOMY N/A 09/25/2015  ? Procedure: LAPAROSCOPIC CHOLECYSTECTOMY WITH INTRAOPERATIVE CHOLANGIOGRAM;  Surgeon: Alphonsa Overall, MD;  Location: [facility];  Service: General;  Laterality: N/A;  ? TONSILLECTOMY    ? ? ?Social History  ? ?Socioeconomic History  ? Marital status: Single  ?  Spouse name: Not on file  ? Number of children: 0  ? Years of education: Assoc  ? Highest education level: Not on file  ?Occupational History  ? Occupation: Personal assistant  ?Tobacco Use  ? Smoking status: Never  ? Smokeless tobacco: Never  ?Vaping Use  ? Vaping Use: Never used  ?Substance and Sexual Activity  ? Alcohol use: No  ? Drug use: No  ? Sexual activity: Not on file  ?Other Topics Concern  ? Not on file  ?Social History Narrative  ? College grad  ? Single  ? Bought her own home in '09  ? Occupation: Programme researcher, broadcasting/film/video and cleaning  ? Fun: Resting, yard work  ? Denies religious beliefs effecting health care.   ? Drinks caffeine daily   ? ?Social Determinants of Health  ? ?Financial Resource Strain: Not on file  ?Food Insecurity: Not on file  ?Transportation Needs: Not on file  ?Physical Activity: Not on file  ?Stress: Not on file  ?Social Connections: Not on file  ?Intimate Partner Violence: Not on file  ?  ? ?Allergies  ?  Allergen Reactions  ? Bactrim Ds [Sulfamethoxazole-Trimethoprim] Other (See Comments)  ?  Blisters   ? Cephalosporins   ?  Hives   ? Oseltamivir Phosphate Swelling  ? Amoxicillin Hives and Rash  ?  Has patient had a PCN reaction causing immediate rash, facial/tongue/throat swelling, SOB or lightheadedness with hypotension: Yes ?Has patient had a PCN reaction causing severe rash involving mucus membranes or skin necrosis: Yes ?Has patient had a PCN reaction that required hospitalization No ?Has patient had a PCN reaction occurring within the last 10 years: Yes ?If all of the above answers are "NO", then may proceed with Cephalosporin use. ?  ? Doxycycline Rash  ? Fluconazole Rash  ?  Added blisters   ?  ? ?Outpatient Medications Prior to Visit  ?Medication Sig Dispense Refill  ? acetaminophen (TYLENOL) 325 MG tablet Take 325 mg by mouth every 4 (four) hours as  needed for fever.     ? COLLAGEN PO Take 1 tablet by mouth daily.    ? esomeprazole (NEXIUM) 40 MG capsule Take 1 capsule (40 mg total) by mouth daily at 12 noon. 90 capsule 0  ? Fish Oil-Cholecalciferol (OMEGA-3 + VITAMIN D3 PO) Take 1 tablet by mouth daily.    ? nitrofurantoin, macrocrystal-monohydrate, (MACROBID) 100 MG capsule Take 100 mg by mouth 2 (two) times daily.    ? potassium chloride SA (KLOR-CON M15) 15 MEQ tablet Take 1 tablet (15 mEq total) by mouth 2 (two) times daily. 180 tablet 0  ? Probiotic Product (ALIGN) 4 MG CAPS Take 1 capsule (4 mg total) by mouth daily. 30 capsule 0  ? terconazole (TERAZOL 3) 0.8 % vaginal cream Place 1 applicator vaginally at bedtime. 20 g 0  ? benzonatate (TESSALON) 200 MG capsule Take 1 capsule (200 mg total) by mouth 3 (three) times daily as needed for cough. 30 capsule 1  ? levofloxacin (LEVAQUIN) 500 MG tablet Take 1 tablet (500 mg total) by mouth daily. 7 tablet 0  ? loratadine (CLARITIN) 10 MG tablet Take 1 tablet (10 mg total) by mouth daily. 30 tablet 1  ? ?No facility-administered medications prior to visit.  ? ? ? ? ? ?Objective:  ? ?Physical Exam: ? ?General appearance: 51 y.o., female, NAD, conversant  ?Eyes: anicteric sclerae; PERRL, tracking appropriately ?HENT: NCAT; MMM ?Neck: Trachea midline; no lymphadenopathy, no JVD ?Lungs: CTAB, no crackles, no wheeze, with normal respiratory effort ?CV: RRR, no murmur  ?Abdomen: Soft, non-tender; non-distended, BS present  ?Extremities: No peripheral edema, warm ?Skin: Normal turgor and texture; no rash ?Psych: Appropriate affect ?Neuro: Alert and oriented to person and place, no focal deficit  ? ? ? ?Vitals:  ? 05/20/21 1611  ?BP: 112/74  ?Pulse: 92  ?Temp: 98.1 ?F (36.7 ?C)  ?TempSrc: Oral  ?SpO2: 100%  ?Weight: 206 lb (93.4 kg)  ?Height: 5' 5.75" (1.67 m)  ? ?100% on RA ?BMI Readings from Last 3 Encounters:  ?05/20/21 33.50 kg/m?  ?04/13/21 33.28 kg/m?  ?03/23/21 34.21 kg/m?  ? ?Wt Readings from Last 3 Encounters:   ?05/20/21 206 lb (93.4 kg)  ?04/13/21 200 lb (90.7 kg)  ?03/23/21 205 lb 9.6 oz (93.3 kg)  ? ? ? ?CBC ?   ?Component Value Date/Time  ? WBC 7.9 02/08/2021 1621  ? RBC 4.53 02/08/2021 1621  ? HGB 13.3 02/08/2021 1621  ? HGB 14.5 01/16/2019 1011  ? HCT 40.6 02/08/2021 1621  ? PLT 462.0 (H) 02/08/2021 1621  ? PLT 356 01/16/2019 1011  ? MCV 89.6 02/08/2021  1621  ? MCH 30.3 12/08/2019 0140  ? MCHC 32.9 02/08/2021 1621  ? RDW 14.8 02/08/2021 1621  ? LYMPHSABS 2.7 02/08/2021 1621  ? MONOABS 0.5 02/08/2021 1621  ? EOSABS 0.1 02/08/2021 1621  ? BASOSABS 0.1 02/08/2021 1621  ? ? ? ?Chest Imaging: ?CXR 02/08/21 reviewed by me unremarkable ? ?Ct Chest 12/2019 reviewed by me with possible subtle peripheral and largely dependent reticular opacities which are likely atelectasis ? ?EGD 08/2019: ?Gastritis, normal esophagus. Gastric polyps. Bx neg for HP ? ?Pulmonary Functions Testing Results: ? ?  Latest Ref Rng & Units 05/20/2021  ?  2:50 PM  ?PFT Results  ?FVC-Pre L 3.34  P  ?FVC-Predicted Pre % 108  P  ?FVC-Post L 3.27  P  ?FVC-Predicted Post % 106  P  ?Pre FEV1/FVC % % 86  P  ?Post FEV1/FCV % % 86  P  ?FEV1-Pre L 2.86  P  ?FEV1-Predicted Pre % 116  P  ?FEV1-Post L 2.82  P  ?DLCO uncorrected ml/min/mmHg 19.56  P  ?DLCO UNC% % 87  P  ?DLCO corrected ml/min/mmHg 19.56  P  ?DLCO COR %Predicted % 87  P  ?DLVA Predicted % 101  P  ?TLC L 5.01  P  ?TLC % Predicted % 94  P  ?RV % Predicted % 84  P  ?  ?P Preliminary result  ? ?PFT reviewed by me normal ? ? ?   ?Assessment & Plan:  ? ?# Chronic cough ?Responded well to PPI. Has in past however seen allergy for AR but per pt doesn't seem active right now, no postnasal drainage.  ? ?Plan: ?- ok to continue nexium for now ?- encouraged to implement TLC measures to reduce GERD and eventually facilitate coming off of ppi given risks of long term use ? ? ?RTC prn ? ? ?I spent 28 minutes dedicated to the care of this patient on the date of this encounter to include pre-visit review of records,  face-to-face time with the patient discussing conditions above, clinical documentation with the electronic health record, and communicating necessary findings to members of the patients care team.  ? ? ?Lawana Pai

## 2021-05-20 NOTE — Progress Notes (Signed)
PFT done today. 

## 2021-06-17 ENCOUNTER — Ambulatory Visit: Payer: 59 | Admitting: Orthopaedic Surgery

## 2021-06-17 ENCOUNTER — Ambulatory Visit (INDEPENDENT_AMBULATORY_CARE_PROVIDER_SITE_OTHER): Payer: 59

## 2021-06-17 ENCOUNTER — Encounter: Payer: Self-pay | Admitting: Orthopaedic Surgery

## 2021-06-17 VITALS — Ht 65.0 in | Wt 202.0 lb

## 2021-06-17 DIAGNOSIS — G8929 Other chronic pain: Secondary | ICD-10-CM | POA: Diagnosis not present

## 2021-06-17 DIAGNOSIS — M25562 Pain in left knee: Secondary | ICD-10-CM | POA: Diagnosis not present

## 2021-06-17 DIAGNOSIS — M170 Bilateral primary osteoarthritis of knee: Secondary | ICD-10-CM

## 2021-06-17 NOTE — Progress Notes (Signed)
Office Visit Note   Patient: Rhonda [name]           Date of Birth: Jun 11, 1970           MRN: [id number] Visit Date: 06/17/2021              Requested by: Janith Lima, MD [address] [place],  [place] [address] PCP: Janith Lima, MD   Assessment & Plan: Visit Diagnoses:  1. Chronic pain of left knee   2. Primary osteoarthritis of both knees     Plan: Rhonda [name] was seen in January for evaluation of right knee pain.  I thought she had chondromalacia patella and referred her to physical therapy.  Because of her work schedule she could not attend the weight she would like to to be referred.  She is now having more trouble with her left knee and mostly along the medial compartment.  Films demonstrate what might be some very early arthritic changes.  This certainly is a possibility of a meniscal tear although she has not had any history of injury or trauma.  She does have popping clicking and some catching and does have trouble sleeping at night.  I will order an MRI scan.  We will also Rhonda [name] send physical therapy.  Should consider with Voltaren gel and anti-inflammatory medicine  Follow-Up Instructions: Return After MRI scan left knee.   Orders:  Orders Placed This Encounter  Procedures   XR KNEE 3 VIEW LEFT   MR Knee Left w/o contrast   No orders of the defined types were placed in this encounter.     Procedures: No procedures performed   Clinical Data: No additional findings.   Subjective: Chief Complaint  Patient presents with   Right Knee - Pain   Left Knee - Pain  Patient presents today for bilateral knee pain. She saw Dr.Dellamae Rosamilia in January of this year for her right knee. She said that her left knee now hurts more than the right. She has more pain medially. She gets shooting pains that will cause her knee to give way. She is taking Ibuprofen for pain relief. She is not diabetic.  HPI  Review of Systems   Objective: Vital Signs: Ht '5\' 5"'$  (1.651 m)    Wt 202 lb (91.6 kg)   BMI 33.61 kg/m   Physical Exam Constitutional:      Appearance: She is well-developed.  Eyes:     Pupils: Pupils are equal, round, and reactive to light.  Pulmonary:     Effort: Pulmonary effort is normal.  Skin:    General: Skin is warm and dry.  Neurological:     Mental Status: She is alert and oriented to person, place, and time.  Psychiatric:        Behavior: Behavior normal.    Ortho Exam awake alert and oriented x3.  Comfortable sitting left knee with diffuse tenderness on the medial compartment.  No effusion.  The knee was not hot warm or red.  No pain with patella compression.  Very minimal patellar crepitation.  No apprehension with patella motion.  No instability.  Full extension flex to at least 100 degrees.  No popliteal pain or mass.  Specialty Comments:  No specialty comments available.  Imaging: XR KNEE 3 VIEW LEFT  Result Date: 06/17/2021 Numbness of the left knee were obtained in several projections.  There may be a little bit of varus.  No ectopic calcification or acute changes.  Some sclerosis  on the tibial side of the medial compartment.  Patella tracks in the midline.  I suspect she does have some arthritis in the joint and she is experiencing predominantly medial joint pain    PMFS History: Patient Active Problem List   Diagnosis Date Noted   Sialoadenitis of sublingual gland 04/13/2021   Multiple drug allergies 04/13/2021   Acute hypokalemia 02/09/2021   Chronic cough 02/08/2021   Primary osteoarthritis of both knees 02/08/2021   Need for hepatitis C screening test 02/08/2021   Chronic night sweats 12/26/2019   Screen for colon cancer 07/30/2019   Psychophysiological insomnia 07/30/2019   Thrombocytosis 11/06/2017   Chiari malformation type II ([place]) 09/27/2016   Abnormal MRI of head 03/07/2016   Gastroesophageal reflux disease without esophagitis 08/19/2015   IBS (irritable bowel syndrome) 08/19/2015   Routine general  medical examination at a health care facility 07/14/2014   Vitamin B 12 deficiency 12/08/2008   CARPAL TUNNEL SYNDROME 05/14/2007   DE QUERVAIN'S TENOSYNOVITIS 05/14/2007   Allergic rhinitis 01/31/2007   Past Medical History:  Diagnosis Date   Allergic rhinitis    Allergy    B12 deficiency    Carpal tunnel syndrome    Dysfunction of eustachian tube     Family History  Problem Relation Age of Onset   Heart disease Father    Hypertension Father    Diabetes Father    Hyperlipidemia Mother    Hypertension Mother    Heart disease Maternal Grandmother    Diabetes Brother    Headache Neg Hx    Colon cancer Neg Hx    Prostate cancer Neg Hx    Rectal cancer Neg Hx    Stomach cancer Neg Hx    Esophageal cancer Neg Hx     Past Surgical History:  Procedure Laterality Date   BUNIONECTOMY     right    CHOLECYSTECTOMY N/A 09/25/2015   Procedure: LAPAROSCOPIC CHOLECYSTECTOMY WITH INTRAOPERATIVE CHOLANGIOGRAM;  Surgeon: Alphonsa Overall, MD;  Location: [facility];  Service: General;  Laterality: N/A;   TONSILLECTOMY     Social History   Occupational History   Occupation: Personal assistant  Tobacco Use   Smoking status: Never   Smokeless tobacco: Never  Vaping Use   Vaping Use: Never used  Substance and Sexual Activity   Alcohol use: No   Drug use: No   Sexual activity: Not on file

## 2021-07-01 ENCOUNTER — Ambulatory Visit
Admission: RE | Admit: 2021-07-01 | Discharge: 2021-07-01 | Disposition: A | Discharge status: Home / Self Care | Payer: 59 | Source: Ambulatory Visit | Attending: Orthopaedic Surgery | Admitting: Orthopaedic Surgery

## 2021-07-01 DIAGNOSIS — M25562 Pain in left knee: Secondary | ICD-10-CM

## 2021-07-05 ENCOUNTER — Ambulatory Visit: Payer: 59 | Attending: Internal Medicine

## 2021-07-05 DIAGNOSIS — Z23 Encounter for immunization: Secondary | ICD-10-CM

## 2021-07-06 ENCOUNTER — Ambulatory Visit: Payer: 59 | Admitting: Physical Therapy

## 2021-07-06 ENCOUNTER — Encounter: Payer: Self-pay | Admitting: Physical Therapy

## 2021-07-06 DIAGNOSIS — R262 Difficulty in walking, not elsewhere classified: Secondary | ICD-10-CM | POA: Diagnosis not present

## 2021-07-06 DIAGNOSIS — M25561 Pain in right knee: Secondary | ICD-10-CM | POA: Diagnosis not present

## 2021-07-06 DIAGNOSIS — M25562 Pain in left knee: Secondary | ICD-10-CM | POA: Diagnosis not present

## 2021-07-06 DIAGNOSIS — G8929 Other chronic pain: Secondary | ICD-10-CM

## 2021-07-06 DIAGNOSIS — M6281 Muscle weakness (generalized): Secondary | ICD-10-CM

## 2021-07-06 NOTE — Therapy (Signed)
OUTPATIENT PHYSICAL THERAPY LOWER EXTREMITY EVALUATION   Patient Name: Rhonda [name] MRN: [id number] DOB:1970-05-06, 51 y.o., female Today's Date: 07/06/2021   PT End of Session - 07/06/21 1646     Visit Number 1    Number of Visits 8    Date for PT Re-Evaluation 09/03/21    Authorization Type [employer]    PT Start Time 1520    PT Stop Time 9528    PT Time Calculation (min) 55 min    Activity Tolerance Patient tolerated treatment well    Behavior During Therapy WFL for tasks assessed/performed             Past Medical History:  Diagnosis Date   Allergic rhinitis    Allergy    B12 deficiency    Carpal tunnel syndrome    Dysfunction of eustachian tube    Past Surgical History:  Procedure Laterality Date   BUNIONECTOMY     right    CHOLECYSTECTOMY N/A 09/25/2015   Procedure: LAPAROSCOPIC CHOLECYSTECTOMY WITH INTRAOPERATIVE CHOLANGIOGRAM;  Surgeon: Alphonsa Overall, MD;  Location: [facility];  Service: General;  Laterality: N/A;   TONSILLECTOMY     Patient Active Problem List   Diagnosis Date Noted   Sialoadenitis of sublingual gland 04/13/2021   Multiple drug allergies 04/13/2021   Acute hypokalemia 02/09/2021   Chronic cough 02/08/2021   Primary osteoarthritis of both knees 02/08/2021   Need for hepatitis C screening test 02/08/2021   Chronic night sweats 12/26/2019   Screen for colon cancer 07/30/2019   Psychophysiological insomnia 07/30/2019   Thrombocytosis 11/06/2017   Chiari malformation type II ([place]) 09/27/2016   Abnormal MRI of head 03/07/2016   Gastroesophageal reflux disease without esophagitis 08/19/2015   IBS (irritable bowel syndrome) 08/19/2015   Routine general medical examination at a health care facility 07/14/2014   Vitamin B 12 deficiency 12/08/2008   CARPAL TUNNEL SYNDROME 05/14/2007   DE QUERVAIN'S TENOSYNOVITIS 05/14/2007   Allergic rhinitis 01/31/2007    PCP: Janith Lima., MD  REFERRING PROVIDER: Garald Balding., MD  REFERRING DIAG:  M17.0 (ICD-10-CM) - Primary osteoarthritis of both knees  THERAPY DIAG:  Chronic pain of right knee - Plan: PT plan of care cert/re-cert  Chronic pain of left knee - Plan: PT plan of care cert/re-cert  Difficulty in walking, not elsewhere classified - Plan: PT plan of care cert/re-cert  Muscle weakness (generalized) - Plan: PT plan of care cert/re-cert  Rationale for Evaluation and Treatment Rehabilitation  ONSET DATE: 8 year history of Rt knee pain, 10 year history of Lt hip and back pain  SUBJECTIVE:   SUBJECTIVE STATEMENT: Pt arriving today reporting long history of bilateral knee pain with her left being worse than her right. Pt stating her pain can increase to 8/10 at times, but is 5-6/10 at present. Pt reporting increased pain when first waking up in the morning and after prolonged sitting or standing.   PERTINENT HISTORY: UXL:KGMW and lumbar OA, chronic pain, lumbar disc protrusion  PAIN:  Are you having pain? Yes: NPRS scale: 5-6/10 Pain location: bilateral knees Pain description: achy, soreness Aggravating factors: prolonged standing, arising after sitting prolonged Relieving factors: changing positions topical creams  PRECAUTIONS: None  WEIGHT BEARING RESTRICTIONS No  FALLS:  Has patient fallen in last 6 months? No  LIVING ENVIRONMENT: Lives with: lives with their family and lives with their spouse Lives in: House/apartment Stairs: No Has following equipment at home: None  OCCUPATION: works for social services  PLOF: Independent  PATIENT  GOALS : sleep without pain, get back to regular exercise program without pain.    OBJECTIVE:   DIAGNOSTIC FINDINGS:  XR R knee "Radiographs of her right knee demonstrate well-preserved joint spacing in the medial lateral and patellofemoral joint.  No osteophyte or cyst  sclerotic areas noted.  Well-maintained alignment.  No acute osseous  injuries.  No noted subluxation of the patella"  PATIENT SURVEYS:  07/06/2021:  FOTO 38% (predicted 62%)  COGNITION:  Overall cognitive status: Within functional limits for tasks assessed     SENSATION: 07/06/21: WFL    MUSCLE LENGTH: 07/06/21: Hamstrings: Right 55 deg; Left 50 deg   POSTURE: rounded shoulders, forward head, and weight shift right  PALPATION: 07/06/21: Tender to palpation around medial joint line on left.   LOWER EXTREMITY ROM:   ROM Right 07/06/21 A/P Left 07/06/21 A/P  Hip flexion    Hip extension    Hip abduction    Hip adduction    Hip internal rotation    Hip external rotation    Knee flexion 108/112 110/115  Knee extension 0 0  Ankle dorsiflexion    Ankle plantarflexion    Ankle inversion    Ankle eversion     (Blank rows = not tested)  LOWER EXTREMITY MMT:  MMT Right eval Left eval  Hip flexion 5/5 5/5  Hip extension    Hip abduction 5/5 4/5  Hip adduction 5/5 4/5  Hip internal rotation    Hip external rotation    Knee flexion 5/5 4/5  Knee extension 5/5 4/5  Ankle dorsiflexion    Ankle plantarflexion    Ankle inversion    Ankle eversion     (Blank rows = not tested)    FUNCTIONAL TESTS:  07/06/2021: 5 times sit to stand: 14 seconds with no UE support  GAIT:  07/06/2021:  Distance walked: 50 feet  Assistive device utilized: None Level of assistance: Complete Independence Comments: wide BOS, mild antalgic gait more on left LE    TODAY'S TREATMENT: 07/06/2021:  HEP instruction/performance c cues for techniques, handout provided.  Trial set performed of each for comprehension and symptom assessment.  See below for exercise list.   PATIENT EDUCATION:  Education details: PT POC, HEP, How to use TENS unit from home Person educated: Patient Education method: Explanation, Demonstration, Tactile cues, Verbal cues, and Handouts Education comprehension: verbalized understanding and returned demonstration   HOME EXERCISE PROGRAM: Access Code: [email or web address] URL: [email or web address] Date:  07/06/2021 Prepared by: Kearney Hard  Exercises - Standing Lumbar Extension at [place]  - 2 x daily - 6 x weekly - 1-2 sets - 10 reps - Supine Bridge  - 2 x daily - 6 x weekly - 1-2 sets - 10 reps - 5 hold - Seated Piriformis Stretch  - 2 x daily - 6 x weekly - 1 sets - 2 reps - 30 hold - Hip Abduction with Resistance Loop  - 2 x daily - 6 x weekly - 1-2 sets - 15-20 reps - Hip Extension with Resistance Loop  - 2 x daily - 6 x weekly - 1-2 sets - 15-20 reps - Seated Heel Slide  - 2 x daily - 7 x weekly - 2 sets - 10 reps - Supine Active Straight Leg Raise  - 2 x daily - 7 x weekly - 2 sets - 10 reps - Sit to Stand  - 2 x daily - 7 x weekly - 3 sets - 10 reps - Seated Hamstring Stretch  -  2 x daily - 7 x weekly - 3 reps - 20-30 seconds hol  ASSESSMENT:  CLINICAL IMPRESSION: Patient is a 51 y.o. female who comes to clinic with complaints of bilateral knee pain dx with chondromalacia patellas with left >right with mobility, strength and movement coordination deficits that impair their ability to perform usual daily and recreational functional activities without increase difficulty/symptoms.  Patient to benefit from skilled PT services to address impairments and limitations to improve to previous level of function without restriction secondary to condition.    OBJECTIVE IMPAIRMENTS decreased activity tolerance, decreased balance, decreased mobility, difficulty walking, decreased ROM, decreased strength, increased edema, impaired flexibility, obesity, and pain.   ACTIVITY LIMITATIONS lifting, bending, sitting, standing, squatting, sleeping, and stairs  PARTICIPATION LIMITATIONS: cleaning, driving, shopping, community activity, occupation, and yard work  PERSONAL FACTORS 1-2 comorbidities: chronic lumbar pain, chronic knee pain, OA  are also affecting patient's functional outcome.   REHAB POTENTIAL: Good  CLINICAL DECISION MAKING: Stable/uncomplicated  EVALUATION COMPLEXITY:  Low   GOALS: Goals reviewed with patient? Yes  SHORT TERM GOALS: Target date: 08/03/2021  Pt will be I and compliant with HEP.  Goal status: New Pt will decrease pain by 50% overall to </= 5 with sleeping Goal status: New  LONG TERM GOALS: Target date: 08/31/2021   Patient will demonstrate/report pain at worst less than or equal to 2/10 to facilitate minimal limitation in daily activity secondary to pain symptoms. Goal status: New   Patient will demonstrate independent use of home exercise program to facilitate ability to maintain/progress functional gains from skilled physical therapy services. Goal status: New   Patient will demonstrate FOTO outcome > or = 62 % to indicate reduced disability due to condition. Goal status: New   Pt will be able to navigate 1 flight of stairs with single hand rail with step over step with pain in bilateral knees </= 2/10.  Goal status: New       5.  Pt will improve bilateral knee flexion to >/= 120 degrees to improve functional mobility.   Goal status: New   PLAN: PT FREQUENCY: 1x/week  PT DURATION: 8 weeks  PLANNED INTERVENTIONS: Therapeutic exercises, Therapeutic activity, Neuromuscular re-education, Balance training, Gait training, Patient/Family education, Joint mobilization, Stair training, Dry Needling, Spinal mobilization, Cryotherapy, Taping, Vasopneumatic device, Ultrasound, and Manual therapy  PLAN FOR NEXT SESSION: Bike, LE strengthening, Knee ROM   Oretha Caprice, PT, MPT 07/06/2021, 4:47 PM

## 2021-07-09 ENCOUNTER — Other Ambulatory Visit (HOSPITAL_BASED_OUTPATIENT_CLINIC_OR_DEPARTMENT_OTHER): Payer: Self-pay

## 2021-07-09 MED ORDER — PFIZER COVID-19 VAC BIVALENT 30 MCG/0.3ML IM SUSP
INTRAMUSCULAR | 0 refills | Status: DC
  Filled 2021-07-09: qty 0.3, 1d supply, fill #0

## 2021-07-09 NOTE — Progress Notes (Signed)
   Covid-19 Vaccination Clinic  Name:  Rhonda [name]    MRN: [id number] DOB: 1970-11-30  07/09/2021  [name] was observed post Covid-19 immunization for 15 minutes without incident. She was provided with Vaccine Information Sheet and instruction to access the V-Safe system.   [name] was instructed to call 911 with any severe reactions post vaccine: Difficulty breathing  Swelling of face and throat  A fast heartbeat  A bad rash all over body  Dizziness and weakness   Immunizations Administered     Name Date Dose VIS Date Route   Pfizer Covid-19 Vaccine Bivalent Booster 07/05/2021  3:45 PM 0.3 mL 09/23/2020 Intramuscular   Manufacturer: [place]   Lot: [id number]   [place]: [phone]

## 2021-07-14 ENCOUNTER — Encounter: Payer: 59 | Admitting: Physical Therapy

## 2021-07-16 ENCOUNTER — Encounter: Payer: Self-pay | Admitting: Physical Therapy

## 2021-07-16 ENCOUNTER — Ambulatory Visit: Payer: 59 | Admitting: Physical Therapy

## 2021-07-16 DIAGNOSIS — M6281 Muscle weakness (generalized): Secondary | ICD-10-CM | POA: Diagnosis not present

## 2021-07-16 DIAGNOSIS — M25562 Pain in left knee: Secondary | ICD-10-CM | POA: Diagnosis not present

## 2021-07-16 DIAGNOSIS — M25561 Pain in right knee: Secondary | ICD-10-CM | POA: Diagnosis not present

## 2021-07-16 DIAGNOSIS — R262 Difficulty in walking, not elsewhere classified: Secondary | ICD-10-CM

## 2021-07-16 DIAGNOSIS — G8929 Other chronic pain: Secondary | ICD-10-CM

## 2021-07-20 ENCOUNTER — Ambulatory Visit: Payer: 59 | Admitting: Family Medicine

## 2021-07-20 ENCOUNTER — Ambulatory Visit: Payer: 59 | Admitting: Physical Therapy

## 2021-07-20 ENCOUNTER — Encounter: Payer: Self-pay | Admitting: Family Medicine

## 2021-07-20 ENCOUNTER — Encounter: Payer: Self-pay | Admitting: Physical Therapy

## 2021-07-20 VITALS — BP 122/72 | HR 80 | Temp 97.8°F | Ht 65.0 in | Wt 205.0 lb

## 2021-07-20 DIAGNOSIS — F4321 Adjustment disorder with depressed mood: Secondary | ICD-10-CM | POA: Diagnosis not present

## 2021-07-20 DIAGNOSIS — M25561 Pain in right knee: Secondary | ICD-10-CM

## 2021-07-20 DIAGNOSIS — M25552 Pain in left hip: Secondary | ICD-10-CM

## 2021-07-20 DIAGNOSIS — L309 Dermatitis, unspecified: Secondary | ICD-10-CM | POA: Diagnosis not present

## 2021-07-20 DIAGNOSIS — R262 Difficulty in walking, not elsewhere classified: Secondary | ICD-10-CM

## 2021-07-20 DIAGNOSIS — M25562 Pain in left knee: Secondary | ICD-10-CM

## 2021-07-20 DIAGNOSIS — G8929 Other chronic pain: Secondary | ICD-10-CM

## 2021-07-20 DIAGNOSIS — M6281 Muscle weakness (generalized): Secondary | ICD-10-CM

## 2021-07-20 DIAGNOSIS — F32A Depression, unspecified: Secondary | ICD-10-CM | POA: Diagnosis not present

## 2021-07-20 MED ORDER — TRIAMCINOLONE ACETONIDE 0.1 % EX CREA: 1.0000 | TOPICAL_CREAM | Freq: Two times a day (BID) | CUTANEOUS | 0 refills | Status: DC

## 2021-07-20 MED ORDER — ESCITALOPRAM OXALATE 10 MG PO TABS: 10.0000 mg | ORAL_TABLET | Freq: Every day | ORAL | 1 refills | Status: DC

## 2021-07-20 NOTE — Assessment & Plan Note (Signed)
She reportedly has not dealt with the loss of her father.  Has not been involved in therapy.  She will call and schedule with a therapist.  She may [facility].  Start on Lexapro.

## 2021-10-25 ENCOUNTER — Encounter: Payer: Self-pay | Admitting: Internal Medicine

## 2021-10-28 ENCOUNTER — Encounter: Payer: Self-pay | Admitting: Internal Medicine

## 2021-11-01 ENCOUNTER — Encounter: Payer: Self-pay | Admitting: Internal Medicine

## 2021-11-01 ENCOUNTER — Ambulatory Visit: Payer: Federal, State, Local not specified - PPO | Admitting: Internal Medicine

## 2021-11-01 VITALS — BP 124/72 | HR 78 | Temp 98.7°F | Ht 65.0 in | Wt 212.0 lb

## 2021-11-01 DIAGNOSIS — R739 Hyperglycemia, unspecified: Secondary | ICD-10-CM

## 2021-11-01 DIAGNOSIS — Z23 Encounter for immunization: Secondary | ICD-10-CM

## 2021-11-01 DIAGNOSIS — R202 Paresthesia of skin: Secondary | ICD-10-CM | POA: Insufficient documentation

## 2021-11-01 DIAGNOSIS — E538 Deficiency of other specified B group vitamins: Secondary | ICD-10-CM | POA: Diagnosis not present

## 2021-11-01 DIAGNOSIS — R7989 Other specified abnormal findings of blood chemistry: Secondary | ICD-10-CM | POA: Diagnosis not present

## 2021-11-01 DIAGNOSIS — E669 Obesity, unspecified: Secondary | ICD-10-CM

## 2021-11-01 DIAGNOSIS — E559 Vitamin D deficiency, unspecified: Secondary | ICD-10-CM | POA: Diagnosis not present

## 2021-11-01 LAB — HEPATIC FUNCTION PANEL
ALT: 12 U/L (ref 0–35)
AST: 17 U/L (ref 0–37)
Albumin: 4 g/dL (ref 3.5–5.2)
Alkaline Phosphatase: 89 U/L (ref 39–117)
Bilirubin, Direct: 0.2 mg/dL (ref 0.0–0.3)
Total Bilirubin: 0.9 mg/dL (ref 0.2–1.2)
Total Protein: 7.8 g/dL (ref 6.0–8.3)

## 2021-11-01 LAB — CBC WITH DIFFERENTIAL/PLATELET
Basophils Absolute: 0.1 10*3/uL (ref 0.0–0.1)
Basophils Relative: 1 % (ref 0.0–3.0)
Eosinophils Absolute: 0.1 10*3/uL (ref 0.0–0.7)
Eosinophils Relative: 1.2 % (ref 0.0–5.0)
HCT: 40.3 % (ref 36.0–46.0)
Hemoglobin: 13.6 g/dL (ref 12.0–15.0)
Lymphocytes Relative: 32.8 % (ref 12.0–46.0)
Lymphs Abs: 2.1 10*3/uL (ref 0.7–4.0)
MCHC: 33.9 g/dL (ref 30.0–36.0)
MCV: 88.5 fl (ref 78.0–100.0)
Monocytes Absolute: 0.5 10*3/uL (ref 0.1–1.0)
Monocytes Relative: 7 % (ref 3.0–12.0)
Neutro Abs: 3.7 10*3/uL (ref 1.4–7.7)
Neutrophils Relative %: 58 % (ref 43.0–77.0)
Platelets: 405 10*3/uL — ABNORMAL HIGH (ref 150.0–400.0)
RBC: 4.55 Mil/uL (ref 3.87–5.11)
RDW: 14.3 % (ref 11.5–15.5)
WBC: 6.5 10*3/uL (ref 4.0–10.5)

## 2021-11-01 LAB — BASIC METABOLIC PANEL
BUN: 11 mg/dL (ref 6–23)
CO2: 26 mEq/L (ref 19–32)
Calcium: 9.6 mg/dL (ref 8.4–10.5)
Chloride: 101 mEq/L (ref 96–112)
Creatinine, Ser: 0.89 mg/dL (ref 0.40–1.20)
GFR: 74.69 mL/min (ref >60.00)
Glucose, Bld: 102 mg/dL — ABNORMAL HIGH (ref 70–99)
Potassium: 4.7 mEq/L (ref 3.5–5.1)
Sodium: 136 mEq/L (ref 135–145)

## 2021-11-01 LAB — VITAMIN D 25 HYDROXY (VIT D DEFICIENCY, FRACTURES): VITD: 25.06 ng/mL — ABNORMAL LOW (ref 30.00–100.00)

## 2021-11-01 LAB — LIPID PANEL
Cholesterol: 168 mg/dL (ref 0–200)
HDL: 69.7 mg/dL (ref >39.00)
LDL Cholesterol: 87 mg/dL (ref 0–99)
NonHDL: 98.52
Total CHOL/HDL Ratio: 2
Triglycerides: 56 mg/dL (ref 0.0–149.0)
VLDL: 11.2 mg/dL (ref 0.0–40.0)

## 2021-11-01 LAB — TSH: TSH: 0.59 u[IU]/mL (ref 0.35–5.50)

## 2021-11-01 LAB — VITAMIN B12: Vitamin B-12: >1500 pg/mL — ABNORMAL HIGH (ref 211–911)

## 2021-11-01 LAB — HEMOGLOBIN A1C: Hgb A1c MFr Bld: 5.6 % (ref 4.6–6.5)

## 2021-11-01 MED ORDER — CYANOCOBALAMIN 1000 MCG/ML IJ SOLN
1000.0000 ug | Freq: Once | INTRAMUSCULAR | Status: AC
  Administered 2021-11-01: 1000 ug via INTRAMUSCULAR

## 2021-11-01 MED ORDER — WEGOVY 0.25 MG/0.5ML ~~LOC~~ SOAJ: 0.2500 mg | SUBCUTANEOUS | 11 refills | Status: DC

## 2021-11-01 NOTE — Assessment & Plan Note (Signed)
Ok for low dose [employer] - rx sent

## 2021-11-01 NOTE — Patient Instructions (Signed)
Please wear the wrist splints at night to help the hands, but if not better please call for the nerve testing  You had the b12 shot today -   please return monthly for nurse visit for the B12 shots  Please take all new medication as prescribed - the wegovy (that has to have a PA at the pharmacy, but [facility] is currently paying for this and will approve)  Please continue all other medications as before, and refills have been done if requested.  Please have the pharmacy call with any other refills you may need.  Please continue your efforts at being more active, low cholesterol diet, and weight control.  Please keep your appointments with your specialists as you may have planned  Please go to the LAB at the blood drawing area for the tests to be done  You will be contacted by phone if any changes need to be made immediately.  Otherwise, you will receive a letter about your results with an explanation, but please check with MyChart first.  Please remember to sign up for MyChart if you have not done so, as this will be important to you in the future with finding out test results, communicating by private email, and scheduling acute appointments online when needed.  Please make an Appointment to return in 6 months, or sooner if needed, with Dr Ronnald Ramp

## 2021-11-01 NOTE — Progress Notes (Signed)
Patient ID: Rhonda [name], female   DOB: [birth year], 51 y.o.   MRN: [id number]        Chief Complaint: follow up as pt of Dr Ronnald Ramp with bilateral hands fingers tingling       HPI:  Rhonda [name] is a 51 y.o. female here with c/o persistent recurring finger and hand tingling, for more than 6 months, really for a couple of years, always worse in the am, mild, better later in the day, and not associated with pain or weakness.  Due for shingles shot #2, due for B12 monthly shot though admits has not f/u for this in last 6 mo as well.  Has gained significant wt recently, not clear if related to worsening hand numbness.  Has wrist splints at home, not using them.  Pt is ok to start [facility], works for [facility] [employer] and likely to be approved it seems.         Wt Readings from Last 3 Encounters:  11/01/21 212 lb (96.2 kg)  07/20/21 205 lb (93 kg)  06/17/21 202 lb (91.6 kg)   BP Readings from Last 3 Encounters:  11/01/21 124/72  07/20/21 122/72  05/20/21 112/74         Past Medical History:  Diagnosis Date   Allergic rhinitis    Allergy    B12 deficiency    Carpal tunnel syndrome    Dysfunction of eustachian tube    Past Surgical History:  Procedure Laterality Date   BUNIONECTOMY     right    CHOLECYSTECTOMY N/A 09/25/2015   Procedure: LAPAROSCOPIC CHOLECYSTECTOMY WITH INTRAOPERATIVE CHOLANGIOGRAM;  Surgeon: Alphonsa Overall, MD;  Location: [facility];  Service: General;  Laterality: N/A;   TONSILLECTOMY      reports that she has never smoked. She has never used smokeless tobacco. She reports that she does not drink alcohol and does not use drugs. family history includes Diabetes in her brother and father; Heart disease in her father and maternal grandmother; Hyperlipidemia in her mother; Hypertension in her father and mother. Allergies  Allergen Reactions   Bactrim Ds [Sulfamethoxazole-Trimethoprim] Other (See Comments)    Blisters    Cephalosporins     Hives    Oseltamivir Phosphate Swelling    Amoxicillin Hives and Rash    Has patient had a PCN reaction causing immediate rash, facial/tongue/throat swelling, SOB or lightheadedness with hypotension: Yes Has patient had a PCN reaction causing severe rash involving mucus membranes or skin necrosis: Yes Has patient had a PCN reaction that required hospitalization No Has patient had a PCN reaction occurring within the last 10 years: Yes If all of the above answers are "NO", then may proceed with Cephalosporin use.    Doxycycline Rash   Fluconazole Rash    Added blisters    Current Outpatient Medications on File Prior to Visit  Medication Sig Dispense Refill   acetaminophen (TYLENOL) 325 MG tablet Take 325 mg by mouth every 4 (four) hours as needed for fever.      COLLAGEN PO Take 1 tablet by mouth daily.     COVID-19 mRNA bivalent vaccine, Pfizer, (PFIZER COVID-19 VAC BIVALENT) injection Inject into the muscle. 0.3 mL 0   escitalopram (LEXAPRO) 10 MG tablet Take 1 tablet (10 mg total) by mouth daily. 30 tablet 1   esomeprazole (NEXIUM) 40 MG capsule Take 1 capsule (40 mg total) by mouth daily at 12 noon. 30 capsule 11   Fish Oil-Cholecalciferol (OMEGA-3 + VITAMIN D3 PO) Take 1 tablet  by mouth daily.     nitrofurantoin, macrocrystal-monohydrate, (MACROBID) 100 MG capsule Take 100 mg by mouth 2 (two) times daily.     potassium chloride SA (KLOR-CON M15) 15 MEQ tablet Take 1 tablet (15 mEq total) by mouth 2 (two) times daily. 180 tablet 0   Probiotic Product (ALIGN) 4 MG CAPS Take 1 capsule (4 mg total) by mouth daily. 30 capsule 0   terconazole (TERAZOL 3) 0.8 % vaginal cream Place 1 applicator vaginally at bedtime. 20 g 0   triamcinolone cream (KENALOG) 0.1 % Apply 1 Application topically 2 (two) times daily. 30 g 0   No current facility-administered medications on file prior to visit.        ROS:  All others reviewed and negative.  Objective        PE:  BP 124/72 (BP Location: Left Arm, Patient Position: Sitting, Cuff Size:  Large)   Pulse 78   Temp 98.7 F (37.1 C) (Oral)   Ht '5\' 5"'$  (1.651 m)   Wt 212 lb (96.2 kg)   SpO2 96%   BMI 35.28 kg/m                 Constitutional: Pt appears in NAD               HENT: Head: NCAT.                Right Ear: External ear normal.                 Left Ear: External ear normal.                Eyes: . Pupils are equal, round, and reactive to light. Conjunctivae and EOM are normal               Nose: without d/c or deformity               Neck: Neck supple. Gross normal ROM               Cardiovascular: Normal rate and regular rhythm.                 Pulmonary/Chest: Effort normal and breath sounds without rales or wheezing.                Abd:  Soft, NT, ND, + BS, no organomegaly               Neurological: Pt is alert. At baseline orientation, motor grossly intact               Skin: Skin is warm. No rashes, no other new lesions, LE edema - none               Psychiatric: Pt behavior is normal without agitation   Micro: none  Cardiac tracings I have personally interpreted today:  none  Pertinent Radiological findings (summarize): none   Lab Results  Component Value Date   WBC 6.5 11/01/2021   HGB 13.6 11/01/2021   HCT 40.3 11/01/2021   PLT 405.0 (H) 11/01/2021   GLUCOSE 79 02/08/2021   CHOL 190 02/08/2021   TRIG 87.0 02/08/2021   HDL 68.00 02/08/2021   LDLCALC 104 (H) 02/08/2021   ALT 13 02/08/2021   AST 16 02/08/2021   NA 136 02/08/2021   K 3.4 (L) 02/08/2021   CL 99 02/08/2021   CREATININE 0.78 02/08/2021   BUN 10 02/08/2021   CO2 27 02/08/2021   TSH 0.62  04/03/2020   HGBA1C 5.6 11/01/2021   Assessment/Plan:  Rhonda [name] is a 51 y.o. Black or African American [2] female with  has a past medical history of Allergic rhinitis, Allergy, B12 deficiency, Carpal tunnel syndrome, and Dysfunction of eustachian tube.  Vitamin B 12 deficiency Lab Results  Component Value Date   VITAMINB12 241 02/08/2021   Low, to restart IM B12  monthly  Paresthesia of hand, bilateral ? Worsening recently but  No pain and exam benign, to restart B12 as above, also check TSH and A1c, and use the wrist splints at bedtime; and consider NCS/EMG if persists or worsens  Obesity (BMI 35.0-39.9 without comorbidity) Ok for low dose Wegovy - rx sent  Followup: Return in about 6 months (around 05/03/2022).  Cathlean Cower, MD 11/01/2021 1:11 PM [facility]

## 2021-11-01 NOTE — Assessment & Plan Note (Signed)
Lab Results  Component Value Date   VITAMINB12 241 02/08/2021   Low, to restart IM B12 monthly

## 2021-11-01 NOTE — Assessment & Plan Note (Signed)
?   Worsening recently but  No pain and exam benign, to restart B12 as above, also check TSH and A1c, and use the wrist splints at bedtime; and consider NCS/EMG if persists or worsens

## 2021-11-02 ENCOUNTER — Telehealth: Payer: Self-pay

## 2021-11-02 NOTE — Telephone Encounter (Signed)
PA started   Key: [facility]  Personalized support and financial assistance may be available through the [facility] [place] program. For more information, and to see program requirements, click on the More Info button to the right.  Message from plan: Your PA request has been approved. Additional information will be provided in the approval communication

## 2021-11-24 ENCOUNTER — Telehealth: Payer: Self-pay | Admitting: *Deleted

## 2021-11-24 NOTE — Telephone Encounter (Signed)
Wegovy 0.'25mg'$ /0.79m [employer] SOAJ has been approved from 10/03/2021 through 05/01/2022.

## 2021-12-28 DIAGNOSIS — Z01419 Encounter for gynecological examination (general) (routine) without abnormal findings: Secondary | ICD-10-CM | POA: Diagnosis not present

## 2021-12-28 DIAGNOSIS — Z1231 Encounter for screening mammogram for malignant neoplasm of breast: Secondary | ICD-10-CM | POA: Diagnosis not present

## 2021-12-28 DIAGNOSIS — Z7689 Persons encountering health services in other specified circumstances: Secondary | ICD-10-CM | POA: Diagnosis not present

## 2022-01-06 DIAGNOSIS — Z713 Dietary counseling and surveillance: Secondary | ICD-10-CM | POA: Diagnosis not present

## 2022-01-06 DIAGNOSIS — Z6835 Body mass index (BMI) 35.0-35.9, adult: Secondary | ICD-10-CM | POA: Diagnosis not present

## 2022-02-08 DIAGNOSIS — Z6834 Body mass index (BMI) 34.0-34.9, adult: Secondary | ICD-10-CM | POA: Diagnosis not present

## 2022-02-08 DIAGNOSIS — E669 Obesity, unspecified: Secondary | ICD-10-CM | POA: Diagnosis not present

## 2022-02-08 DIAGNOSIS — Z713 Dietary counseling and surveillance: Secondary | ICD-10-CM | POA: Diagnosis not present

## 2022-02-11 ENCOUNTER — Telehealth: Payer: Federal, State, Local not specified - PPO | Admitting: Nurse Practitioner

## 2022-02-11 VITALS — Temp 99.1°F | Ht 65.0 in | Wt 206.0 lb

## 2022-02-11 DIAGNOSIS — U071 COVID-19: Secondary | ICD-10-CM | POA: Insufficient documentation

## 2022-02-11 MED ORDER — PROMETHAZINE-DM 6.25-15 MG/5ML PO SYRP: 5.0000 mL | ORAL_SOLUTION | Freq: Two times a day (BID) | ORAL | 0 refills | Status: DC | PRN

## 2022-02-11 MED ORDER — BENZONATATE 200 MG PO CAPS: 200.0000 mg | ORAL_CAPSULE | Freq: Two times a day (BID) | ORAL | 0 refills | Status: DC | PRN

## 2022-02-11 MED ORDER — NIRMATRELVIR/RITONAVIR (PAXLOVID)TABLET: 3.0000 | ORAL_TABLET | Freq: Two times a day (BID) | ORAL | 0 refills | Status: AC

## 2022-02-11 NOTE — Assessment & Plan Note (Signed)
Acute, due to patient's history of severe illness secondary to covid 19 in the past will recommend treatment with paxlovid. Recommended rest, hydration, walking in home to promote blood circulation and lung expansion. Educated regarding current isolation recommendations. Patient educated to call 911 if symptoms worsen. She reports understanding.

## 2022-02-11 NOTE — Progress Notes (Signed)
   Established Patient Office Visit  An audio/visual tele-health visit was completed today for this patient. I connected with  Rhonda [name] on 02/11/22 utilizing audio/visual technology and verified that I am speaking with the correct person using two identifiers. The patient was located at their home, and I was located at the office of [place] at [employer] during the encounter. I discussed the limitations of evaluation and management by telemedicine. The patient expressed understanding and agreed to proceed.     Subjective   Patient ID: Rhonda [name], female    DOB: Sep 28, 1970  Age: 52 y.o. MRN: [id number]  Chief Complaint  Patient presents with   Covid Positive    Tested positive this morning, chills with sweating, Dry cough( hurts), headache     Symptom onset 3 days ago. Has history of bilateral pneumonia secondary to covid 19 infection. Has been vaccinated x 4. LMP 1 week ago, denies any sexual activity since last period.     Review of Systems  Constitutional:  Positive for chills and malaise/fatigue.  HENT:  Positive for congestion, ear pain and sore throat.   Respiratory:  Positive for cough. Negative for sputum production, shortness of breath and wheezing.   Cardiovascular:  Positive for chest pain (from coughing).  Gastrointestinal:  Positive for vomiting. Negative for diarrhea and nausea.  Musculoskeletal:  Positive for myalgias.  Neurological:  Positive for dizziness and headaches.      Objective:     Temp 99.1 F (37.3 C) Comment: 100.2, earlier  Ht '5\' 5"'$  (1.651 m)   Wt 206 lb (93.4 kg)   LMP 02/04/2022   BMI 34.28 kg/m    Physical Exam Comprehensive physical exam not completed today as office visit was conducted remotely.  Appears fatigued on video. No signe of acute respiratory distress. Does cough a few times, .  Patient was alert and oriented, and appeared to have appropriate judgment.   No results found for any visits on  02/11/22.    The 10-year ASCVD risk score (Arnett DK, et al., 2019) is: 1.3%    Assessment & Plan:   Problem List Items Addressed This Visit       Other   COVID-19 - Primary   Relevant Medications   nirmatrelvir/ritonavir (PAXLOVID) 20 x 150 MG & 10 x '100MG'$  TABS   promethazine-dextromethorphan (PROMETHAZINE-DM) 6.25-15 MG/5ML syrup   benzonatate (TESSALON) 200 MG capsule    Return if symptoms worsen or fail to improve.    Ailene Ards, NP

## 2022-03-03 ENCOUNTER — Ambulatory Visit: Payer: Federal, State, Local not specified - PPO | Admitting: Internal Medicine

## 2022-03-03 ENCOUNTER — Ambulatory Visit (INDEPENDENT_AMBULATORY_CARE_PROVIDER_SITE_OTHER): Payer: Federal, State, Local not specified - PPO

## 2022-03-03 ENCOUNTER — Encounter: Payer: Self-pay | Admitting: Internal Medicine

## 2022-03-03 VITALS — BP 120/74 | HR 95 | Temp 98.3°F | Ht 65.0 in | Wt 211.0 lb

## 2022-03-03 DIAGNOSIS — D75839 Thrombocytosis, unspecified: Secondary | ICD-10-CM | POA: Diagnosis not present

## 2022-03-03 DIAGNOSIS — R079 Chest pain, unspecified: Secondary | ICD-10-CM | POA: Diagnosis not present

## 2022-03-03 DIAGNOSIS — R5382 Chronic fatigue, unspecified: Secondary | ICD-10-CM | POA: Diagnosis not present

## 2022-03-03 DIAGNOSIS — E538 Deficiency of other specified B group vitamins: Secondary | ICD-10-CM

## 2022-03-03 DIAGNOSIS — R059 Cough, unspecified: Secondary | ICD-10-CM | POA: Diagnosis not present

## 2022-03-03 DIAGNOSIS — R052 Subacute cough: Secondary | ICD-10-CM

## 2022-03-03 NOTE — Patient Instructions (Signed)

## 2022-03-03 NOTE — Progress Notes (Unsigned)
Subjective:  Patient ID: Rhonda [name], female    DOB: [birth year]  Age: 52 y.o. MRN: [id number]  CC: No chief complaint on file.   HPI AMEAH ROMANOWSKI presents for ***  Outpatient Medications Prior to Visit  Medication Sig Dispense Refill   acetaminophen (TYLENOL) 325 MG tablet Take 325 mg by mouth every 4 (four) hours as needed for fever.     esomeprazole (NEXIUM) 40 MG capsule Take 1 capsule (40 mg total) by mouth daily at 12 noon. 30 capsule 11   Fish Oil-Cholecalciferol (OMEGA-3 + VITAMIN D3 PO) Take 1 tablet by mouth daily.     benzonatate (TESSALON) 200 MG capsule Take 1 capsule (200 mg total) by mouth 2 (two) times daily as needed for cough. 20 capsule 0   COVID-19 mRNA bivalent vaccine, Pfizer, (PFIZER COVID-19 VAC BIVALENT) injection Inject into the muscle. 0.3 mL 0   promethazine-dextromethorphan (PROMETHAZINE-DM) 6.25-15 MG/5ML syrup Take 5 mLs by mouth 2 (two) times daily as needed for cough. 118 mL 0   No facility-administered medications prior to visit.    ROS Review of Systems  Objective:  BP 120/74 (BP Location: Right Arm, Patient Position: Sitting, Cuff Size: Large)   Pulse 95   Temp 98.3 F (36.8 C) (Oral)   Ht 5' 5"$  (1.651 m)   Wt 211 lb (95.7 kg)   LMP 02/04/2022   SpO2 97%   BMI 35.11 kg/m   BP Readings from Last 3 Encounters:  03/03/22 120/74  11/01/21 124/72  07/20/21 122/72    Wt Readings from Last 3 Encounters:  03/03/22 211 lb (95.7 kg)  02/11/22 206 lb (93.4 kg)  11/01/21 212 lb (96.2 kg)    Physical Exam Cardiovascular:     Rate and Rhythm: Normal rate and regular rhythm.     Heart sounds: Normal heart sounds, S1 normal and S2 normal.     Comments: EKG- NSR, 87 bpm Normal EKG Musculoskeletal:     Right lower leg: No edema.     Left lower leg: No edema.     Lab Results  Component Value Date   WBC 6.5 03/07/2022   HGB 13.0 03/07/2022   HCT 38.0 03/07/2022   PLT 394.0 03/07/2022   GLUCOSE 95 03/07/2022   CHOL 168  11/01/2021   TRIG 56.0 11/01/2021   HDL 69.70 11/01/2021   LDLCALC 87 11/01/2021   ALT 11 03/07/2022   AST 15 03/07/2022   NA 138 03/07/2022   K 3.7 03/07/2022   CL 102 03/07/2022   CREATININE 0.78 03/07/2022   BUN 9 03/07/2022   CO2 25 03/07/2022   TSH 0.55 03/07/2022   HGBA1C 5.6 11/01/2021    MR Knee Left w/o contrast  Result Date: 07/01/2021 CLINICAL DATA:  Chronic left knee pain.  Negative x-ray. EXAM: MRI OF THE LEFT KNEE WITHOUT CONTRAST TECHNIQUE: Multiplanar, multisequence MR imaging of the left knee was performed. No intravenous contrast was administered. COMPARISON:  Radiographs dated Jun 17, 2021 FINDINGS: MENISCI Medial: Mild degenerative changes without discrete tear. Lateral: Mild degenerative changes without discrete tear. LIGAMENTS Cruciates: ACL and PCL are intact. Collaterals: Medial collateral ligament is intact. Lateral collateral ligament complex is intact. CARTILAGE Patellofemoral: Mild articular thinning of the trochlea without full-thickness defect. Medial: Mild partial-thickness cartilage loss of the medial femorotibial compartment. Lateral:  No chondral defect. JOINT: Small joint effusion. Normal Hoffa's fat-pad. No plical thickening. POPLITEAL FOSSA: Popliteus tendon is intact. No Baker's cyst. EXTENSOR MECHANISM: Intact quadriceps tendon. Intact patellar tendon. Intact lateral  patellar retinaculum. Intact medial patellar retinaculum. Intact MPFL. BONES: No aggressive osseous lesion. No fracture or dislocation. Other: No fluid collection or hematoma. Muscles are normal. IMPRESSION: 1.  No evidence of acute meniscal tear. 2.  Cruciate and collateral ligaments are intact. 3.  Quadriceps tendon and patellar tendon are also intact. 4. No full-thickness cartilage defect or evidence of significant arthropathy. Electronically Signed   By: Keane Police D.O.   On: 07/01/2021 23:30   No results found.   Assessment & Plan:   Diagnoses and all orders for this visit:  Subacute  cough -     DG Chest 2 View; Future  Chest pain at rest -     DG Chest 2 View; Future -     EKG 12-Lead -     Brain natriuretic peptide; Future -     Troponin I (High Sensitivity); Future  Thrombocytosis -     IBC + Ferritin; Future -     CBC with Differential/Platelet; Future  Vitamin B 12 deficiency  Chronic fatigue -     Basic metabolic panel; Future -     TSH; Future -     Hepatic function panel; Future -     Brain natriuretic peptide; Future -     Troponin I (High Sensitivity); Future   I have discontinued Beverly COVID-19 Vac Bivalent, promethazine-dextromethorphan, and benzonatate. I am also having her maintain her acetaminophen, Fish Oil-Cholecalciferol (OMEGA-3 + VITAMIN D3 PO), and esomeprazole.  No orders of the defined types were placed in this encounter.    Follow-up: Return in about 3 months (around 06/01/2022).  Scarlette Calico, MD

## 2022-03-07 ENCOUNTER — Other Ambulatory Visit (INDEPENDENT_AMBULATORY_CARE_PROVIDER_SITE_OTHER): Payer: Federal, State, Local not specified - PPO

## 2022-03-07 DIAGNOSIS — R5382 Chronic fatigue, unspecified: Secondary | ICD-10-CM

## 2022-03-07 DIAGNOSIS — D75839 Thrombocytosis, unspecified: Secondary | ICD-10-CM

## 2022-03-07 DIAGNOSIS — R079 Chest pain, unspecified: Secondary | ICD-10-CM | POA: Diagnosis not present

## 2022-03-07 LAB — CBC WITH DIFFERENTIAL/PLATELET
Basophils Absolute: 0 10*3/uL (ref 0.0–0.1)
Basophils Relative: 0.5 % (ref 0.0–3.0)
Eosinophils Absolute: 0.2 10*3/uL (ref 0.0–0.7)
Eosinophils Relative: 2.4 % (ref 0.0–5.0)
HCT: 38 % (ref 36.0–46.0)
Hemoglobin: 13 g/dL (ref 12.0–15.0)
Lymphocytes Relative: 41.3 % (ref 12.0–46.0)
Lymphs Abs: 2.7 10*3/uL (ref 0.7–4.0)
MCHC: 34.1 g/dL (ref 30.0–36.0)
MCV: 87.6 fl (ref 78.0–100.0)
Monocytes Absolute: 0.4 10*3/uL (ref 0.1–1.0)
Monocytes Relative: 5.9 % (ref 3.0–12.0)
Neutro Abs: 3.2 10*3/uL (ref 1.4–7.7)
Neutrophils Relative %: 49.9 % (ref 43.0–77.0)
Platelets: 394 10*3/uL (ref 150.0–400.0)
RBC: 4.34 Mil/uL (ref 3.87–5.11)
RDW: 14 % (ref 11.5–15.5)
WBC: 6.5 10*3/uL (ref 4.0–10.5)

## 2022-03-07 LAB — BASIC METABOLIC PANEL
BUN: 9 mg/dL (ref 6–23)
CO2: 25 mEq/L (ref 19–32)
Calcium: 9.6 mg/dL (ref 8.4–10.5)
Chloride: 102 mEq/L (ref 96–112)
Creatinine, Ser: 0.78 mg/dL (ref 0.40–1.20)
GFR: 87.3 mL/min (ref >60.00)
Glucose, Bld: 95 mg/dL (ref 70–99)
Potassium: 3.7 mEq/L (ref 3.5–5.1)
Sodium: 138 mEq/L (ref 135–145)

## 2022-03-07 LAB — HEPATIC FUNCTION PANEL
ALT: 11 U/L (ref 0–35)
AST: 15 U/L (ref 0–37)
Albumin: 4 g/dL (ref 3.5–5.2)
Alkaline Phosphatase: 91 U/L (ref 39–117)
Bilirubin, Direct: 0.2 mg/dL (ref 0.0–0.3)
Total Bilirubin: 0.7 mg/dL (ref 0.2–1.2)
Total Protein: 7.3 g/dL (ref 6.0–8.3)

## 2022-03-07 LAB — IBC + FERRITIN
Ferritin: 63.7 ng/mL (ref 10.0–291.0)
Iron: 51 ug/dL (ref 42–145)
Saturation Ratios: 15.7 % — ABNORMAL LOW (ref 20.0–50.0)
TIBC: 324.8 ug/dL (ref 250.0–450.0)
Transferrin: 232 mg/dL (ref 212.0–360.0)

## 2022-03-07 LAB — BRAIN NATRIURETIC PEPTIDE: Pro B Natriuretic peptide (BNP): 18 pg/mL (ref 0.0–100.0)

## 2022-03-07 LAB — TROPONIN I (HIGH SENSITIVITY): High Sens Troponin I: 3 ng/L (ref 2–17)

## 2022-03-07 LAB — TSH: TSH: 0.55 u[IU]/mL (ref 0.35–5.50)

## 2022-03-08 ENCOUNTER — Encounter: Payer: Self-pay | Admitting: Internal Medicine

## 2022-03-24 DIAGNOSIS — Z6834 Body mass index (BMI) 34.0-34.9, adult: Secondary | ICD-10-CM | POA: Diagnosis not present

## 2022-03-24 DIAGNOSIS — Z713 Dietary counseling and surveillance: Secondary | ICD-10-CM | POA: Diagnosis not present

## 2022-03-30 ENCOUNTER — Other Ambulatory Visit (HOSPITAL_BASED_OUTPATIENT_CLINIC_OR_DEPARTMENT_OTHER): Payer: Self-pay

## 2022-03-30 ENCOUNTER — Encounter: Payer: Self-pay | Admitting: Internal Medicine

## 2022-03-30 MED ORDER — SEMAGLUTIDE-WEIGHT MANAGEMENT 0.25 MG/0.5ML ~~LOC~~ SOAJ
0.2500 mg | SUBCUTANEOUS | 11 refills | Status: DC
  Filled 2022-03-30: qty 2, 28d supply, fill #0
  Filled 2022-05-04: qty 2, 28d supply, fill #1
  Filled 2022-05-31: qty 2, 28d supply, fill #2

## 2022-03-30 MED ORDER — COMIRNATY 30 MCG/0.3ML IM SUSY
PREFILLED_SYRINGE | INTRAMUSCULAR | 0 refills | Status: DC
  Filled 2022-03-30: qty 0.3, 1d supply, fill #0

## 2022-04-04 ENCOUNTER — Encounter: Payer: Self-pay | Admitting: Internal Medicine

## 2022-04-04 ENCOUNTER — Other Ambulatory Visit (HOSPITAL_BASED_OUTPATIENT_CLINIC_OR_DEPARTMENT_OTHER): Payer: Self-pay

## 2022-04-05 ENCOUNTER — Telehealth: Payer: Self-pay

## 2022-04-05 ENCOUNTER — Other Ambulatory Visit (HOSPITAL_COMMUNITY): Payer: Self-pay

## 2022-04-05 NOTE — Telephone Encounter (Signed)
Pharmacy Patient Advocate Encounter  Prior authorization is required for [facility] 0.'25MG'$ /0.5ML auto-injectors. PA submitted and APPROVED on 04/05/22.  Key [id number] Effective: 04/05/22 - 10/02/22

## 2022-04-06 ENCOUNTER — Other Ambulatory Visit (HOSPITAL_BASED_OUTPATIENT_CLINIC_OR_DEPARTMENT_OTHER): Payer: Self-pay

## 2022-04-06 ENCOUNTER — Ambulatory Visit (INDEPENDENT_AMBULATORY_CARE_PROVIDER_SITE_OTHER): Payer: Federal, State, Local not specified - PPO | Admitting: *Deleted

## 2022-04-06 ENCOUNTER — Other Ambulatory Visit: Payer: Self-pay

## 2022-04-06 DIAGNOSIS — E538 Deficiency of other specified B group vitamins: Secondary | ICD-10-CM

## 2022-04-06 MED ORDER — CYANOCOBALAMIN 1000 MCG/ML IJ SOLN
1000.0000 ug | Freq: Once | INTRAMUSCULAR | Status: AC
  Administered 2022-04-06: 1000 ug via INTRAMUSCULAR

## 2022-04-06 NOTE — Progress Notes (Signed)
Pls cosign for B12 inj../lmb  

## 2022-04-07 ENCOUNTER — Other Ambulatory Visit: Payer: Self-pay

## 2022-04-08 ENCOUNTER — Other Ambulatory Visit (HOSPITAL_BASED_OUTPATIENT_CLINIC_OR_DEPARTMENT_OTHER): Payer: Self-pay

## 2022-04-18 ENCOUNTER — Other Ambulatory Visit (HOSPITAL_BASED_OUTPATIENT_CLINIC_OR_DEPARTMENT_OTHER): Payer: Self-pay

## 2022-04-27 DIAGNOSIS — Z6834 Body mass index (BMI) 34.0-34.9, adult: Secondary | ICD-10-CM | POA: Diagnosis not present

## 2022-04-27 DIAGNOSIS — Z713 Dietary counseling and surveillance: Secondary | ICD-10-CM | POA: Diagnosis not present

## 2022-05-01 ENCOUNTER — Other Ambulatory Visit (HOSPITAL_BASED_OUTPATIENT_CLINIC_OR_DEPARTMENT_OTHER): Payer: Self-pay

## 2022-05-01 ENCOUNTER — Other Ambulatory Visit: Payer: Self-pay | Admitting: Internal Medicine

## 2022-05-02 ENCOUNTER — Other Ambulatory Visit (HOSPITAL_BASED_OUTPATIENT_CLINIC_OR_DEPARTMENT_OTHER): Payer: Self-pay

## 2022-05-02 ENCOUNTER — Encounter (HOSPITAL_BASED_OUTPATIENT_CLINIC_OR_DEPARTMENT_OTHER): Payer: Self-pay

## 2022-05-05 ENCOUNTER — Ambulatory Visit: Payer: Federal, State, Local not specified - PPO | Admitting: Family Medicine

## 2022-05-05 ENCOUNTER — Other Ambulatory Visit (HOSPITAL_BASED_OUTPATIENT_CLINIC_OR_DEPARTMENT_OTHER): Payer: Self-pay

## 2022-05-05 ENCOUNTER — Encounter: Payer: Self-pay | Admitting: Family Medicine

## 2022-05-05 VITALS — BP 102/68 | HR 88 | Temp 97.6°F | Ht 65.0 in | Wt 208.0 lb

## 2022-05-05 DIAGNOSIS — H1033 Unspecified acute conjunctivitis, bilateral: Secondary | ICD-10-CM

## 2022-05-05 MED ORDER — NEOMYCIN-POLYMYXIN-DEXAMETH 3.5-10000-0.1 OP SUSP: 2.0000 [drp] | Freq: Four times a day (QID) | OPHTHALMIC | 0 refills | Status: DC

## 2022-05-05 NOTE — Patient Instructions (Signed)
Bacterial Conjunctivitis, Adult Bacterial conjunctivitis is an infection of your conjunctiva. This is the clear membrane that covers the white part of your eye and the inner part of your eyelid. This infection can make your eye: Red or pink. Itchy or irritated. This condition spreads easily from person to person (is contagious) and from one eye to the other eye. What are the causes? This condition is caused by germs (bacteria). You may get the infection if you come into close contact with: A person who has the infection. Items that have germs on them (are contaminated), such as face towels, contact lens solution, or eye makeup. What increases the risk? You are more likely to get this condition if: You have contact with people who have the infection. You wear contact lenses. You have a sinus infection. You have had a recent eye injury or surgery. You have a weak body defense system (immune system). You have dry eyes. What are the signs or symptoms?  Thick, yellowish discharge from the eye. Tearing or watery eyes. Itchy eyes. Burning feeling in your eyes. Eye redness. Swollen eyelids. Blurred vision. How is this treated?  Antibiotic eye drops or ointment. Antibiotic medicine taken by mouth. This is used for infections that do not get better with drops or ointment or that last more than 10 days. Cool, wet cloths placed on the eyes. Artificial tears used 2-6 times a day. Follow these instructions at home: Medicines Take or apply your antibiotic medicine as told by your doctor. Do not stop using it even if you start to feel better. Take or apply over-the-counter and prescription medicines only as told by your doctor. Do not touch your eyelid with the eye-drop bottle or the ointment tube. Managing discomfort Wipe any fluid from your eye with a warm, wet washcloth or a cotton ball. Place a clean, cool, wet cloth on your eye. Do this for 10-20 minutes, 3-4 times a day. General  instructions Do not wear contacts until the infection is gone. Wear glasses until your doctor says it is okay to wear contacts again. Do not wear eye makeup until the infection is gone. Throw away old eye makeup. Change or wash your pillowcase every day. Do not share towels or washcloths. Wash your hands often with soap and water for at least 20 seconds and especially before touching your face or eyes. Use paper towels to dry your hands. Do not touch or rub your eyes. Do not drive or use heavy machinery if your vision is blurred. Contact a doctor if: You have a fever. You do not get better after 10 days. Get help right away if: You have a fever and your symptoms get worse all of a sudden. You have very bad pain when you move your eye. Your face: Hurts. Is red. Is swollen. You have sudden loss of vision. Summary Bacterial conjunctivitis is an infection of your conjunctiva. This infection spreads easily from person to person. Wash your hands often with soap and water for at least 20 seconds and especially before touching your face or eyes. Use paper towels to dry your hands. Take or apply your antibiotic medicine as told by your doctor. Contact a doctor if you have a fever or you do not get better after 10 days. This information is not intended to replace advice given to you by your health care provider. Make sure you discuss any questions you have with your health care provider. Document Revised: 04/22/2020 Document Reviewed: 04/22/2020 Elsevier Patient Education    2023 Elsevier Inc.  

## 2022-05-05 NOTE — Progress Notes (Signed)
Subjective:  Rhonda [name] is a 52 y.o. female who presents for 3-4 day hx of red, itchy and watery eyes. Matting this morning. Family member with pink eye.  No fever, pain with eye movement or foreign body sensation.   Using warm compresses.   ROS as in subjective.   Objective: Vitals:   05/05/22 1119  BP: 102/68  Pulse: 88  Temp: 97.6 F (36.4 C)  SpO2: 99%    General appearance: Alert, WD/WN, no distress                             Skin: warm, no rash                           Head: no sinus tenderness                            Eyes: conjunctiva injected bilaterally, corneas clear, PERRLA, EOMs intact                         Assessment: Acute conjunctivitis of both eyes, unspecified acute conjunctivitis type - Plan: neomycin-polymyxin b-dexamethasone (MAXITROL) 3.5-10000-0.1 SUSP   Plan: Maxitrol prescribed. Continue warm compresses. Discussed contagious nature of condition. Call/return if worsening or if symptoms aren't resolving.

## 2022-05-06 ENCOUNTER — Telehealth: Payer: Self-pay | Admitting: Internal Medicine

## 2022-05-06 ENCOUNTER — Other Ambulatory Visit: Payer: Self-pay | Admitting: Internal Medicine

## 2022-05-06 ENCOUNTER — Other Ambulatory Visit (HOSPITAL_BASED_OUTPATIENT_CLINIC_OR_DEPARTMENT_OTHER): Payer: Self-pay

## 2022-05-06 DIAGNOSIS — E669 Obesity, unspecified: Secondary | ICD-10-CM

## 2022-05-06 MED ORDER — SEMAGLUTIDE-WEIGHT MANAGEMENT 1 MG/0.5ML ~~LOC~~ SOAJ
1.0000 mg | SUBCUTANEOUS | 0 refills | Status: AC
  Filled 2022-05-06 – 2022-07-29 (×2): qty 2, 28d supply, fill #0

## 2022-05-06 MED ORDER — SEMAGLUTIDE-WEIGHT MANAGEMENT 2.4 MG/0.75ML ~~LOC~~ SOAJ
2.4000 mg | SUBCUTANEOUS | 0 refills | Status: AC
  Filled 2022-05-06 – 2022-10-01 (×2): qty 3, 28d supply, fill #0

## 2022-05-06 MED ORDER — SEMAGLUTIDE-WEIGHT MANAGEMENT 1.7 MG/0.75ML ~~LOC~~ SOAJ
1.7000 mg | SUBCUTANEOUS | 0 refills | Status: AC
  Filled 2022-05-06 – 2022-08-27 (×2): qty 3, 28d supply, fill #0

## 2022-05-06 MED ORDER — SEMAGLUTIDE-WEIGHT MANAGEMENT 0.5 MG/0.5ML ~~LOC~~ SOAJ
0.5000 mg | SUBCUTANEOUS | 0 refills | Status: DC
  Filled 2022-05-06 – 2022-06-01 (×2): qty 2, 28d supply, fill #0

## 2022-05-06 NOTE — Telephone Encounter (Signed)
Please disregard - patient needs to stay on 2.5 longer

## 2022-05-06 NOTE — Telephone Encounter (Signed)
Prescription Request  05/06/2022  LOV: 03/03/2022  What is the name of the medication or equipment? Wygovey 5 MG  Have you contacted your pharmacy to request a refill? Yes   Which pharmacy would you like this sent to?   [facility] [address] [place] [address] Phone: [phone] Fax: [phone]    Patient notified that their request is being sent to the clinical staff for review and that they should receive a response within 2 business days.   Please advise at Mobile [phone] (mobile)

## 2022-05-09 ENCOUNTER — Ambulatory Visit (INDEPENDENT_AMBULATORY_CARE_PROVIDER_SITE_OTHER): Payer: Federal, State, Local not specified - PPO

## 2022-05-09 ENCOUNTER — Ambulatory Visit: Payer: Federal, State, Local not specified - PPO | Admitting: Internal Medicine

## 2022-05-09 DIAGNOSIS — E538 Deficiency of other specified B group vitamins: Secondary | ICD-10-CM

## 2022-05-09 MED ORDER — CYANOCOBALAMIN 1000 MCG/ML IJ SOLN
1000.0000 ug | Freq: Once | INTRAMUSCULAR | Status: AC
  Administered 2022-05-09: 1000 ug via INTRAMUSCULAR

## 2022-05-09 NOTE — Progress Notes (Signed)
After obtaining consent, and per orders of Dr. Jones, injection of B12 given by Khalani Novoa P Chancey Ringel. Patient instructed to report any adverse reaction to me immediately.  

## 2022-06-01 ENCOUNTER — Other Ambulatory Visit (HOSPITAL_BASED_OUTPATIENT_CLINIC_OR_DEPARTMENT_OTHER): Payer: Self-pay

## 2022-06-09 ENCOUNTER — Ambulatory Visit: Payer: Federal, State, Local not specified - PPO

## 2022-06-28 ENCOUNTER — Other Ambulatory Visit: Payer: Self-pay | Admitting: Internal Medicine

## 2022-06-28 ENCOUNTER — Other Ambulatory Visit (HOSPITAL_BASED_OUTPATIENT_CLINIC_OR_DEPARTMENT_OTHER): Payer: Self-pay

## 2022-06-28 DIAGNOSIS — E669 Obesity, unspecified: Secondary | ICD-10-CM

## 2022-06-28 MED ORDER — WEGOVY 0.5 MG/0.5ML ~~LOC~~ SOAJ
0.5000 mg | SUBCUTANEOUS | 0 refills | Status: AC
  Filled 2022-06-28: qty 2, 28d supply, fill #0

## 2022-07-07 DIAGNOSIS — Z6832 Body mass index (BMI) 32.0-32.9, adult: Secondary | ICD-10-CM | POA: Diagnosis not present

## 2022-07-07 DIAGNOSIS — Z713 Dietary counseling and surveillance: Secondary | ICD-10-CM | POA: Diagnosis not present

## 2022-07-11 ENCOUNTER — Ambulatory Visit (INDEPENDENT_AMBULATORY_CARE_PROVIDER_SITE_OTHER): Payer: Federal, State, Local not specified - PPO

## 2022-07-11 DIAGNOSIS — E538 Deficiency of other specified B group vitamins: Secondary | ICD-10-CM | POA: Diagnosis not present

## 2022-07-11 MED ORDER — CYANOCOBALAMIN 1000 MCG/ML IJ SOLN
1000.0000 ug | Freq: Once | INTRAMUSCULAR | Status: AC
  Administered 2022-07-11: 1000 ug via INTRAMUSCULAR

## 2022-07-11 NOTE — Progress Notes (Signed)
After obtaining consent, and per orders of Dr. Jones, injection of B12 given by Rhylan Gross P Marcques Wrightsman. Patient instructed to report any adverse reaction to me immediately.  

## 2022-07-29 ENCOUNTER — Other Ambulatory Visit (HOSPITAL_BASED_OUTPATIENT_CLINIC_OR_DEPARTMENT_OTHER): Payer: Self-pay

## 2022-08-11 ENCOUNTER — Ambulatory Visit: Payer: Federal, State, Local not specified - PPO

## 2022-08-15 ENCOUNTER — Ambulatory Visit: Payer: Federal, State, Local not specified - PPO

## 2022-08-24 ENCOUNTER — Encounter (INDEPENDENT_AMBULATORY_CARE_PROVIDER_SITE_OTHER): Payer: Self-pay

## 2022-08-27 ENCOUNTER — Other Ambulatory Visit (HOSPITAL_BASED_OUTPATIENT_CLINIC_OR_DEPARTMENT_OTHER): Payer: Self-pay

## 2022-09-12 ENCOUNTER — Encounter: Payer: Self-pay | Admitting: Internal Medicine

## 2022-09-12 ENCOUNTER — Ambulatory Visit: Payer: Federal, State, Local not specified - PPO | Admitting: Internal Medicine

## 2022-09-12 VITALS — BP 104/68 | HR 85 | Temp 98.5°F | Resp 16 | Ht 65.0 in | Wt 188.8 lb

## 2022-09-12 DIAGNOSIS — E538 Deficiency of other specified B group vitamins: Secondary | ICD-10-CM

## 2022-09-12 DIAGNOSIS — K219 Gastro-esophageal reflux disease without esophagitis: Secondary | ICD-10-CM | POA: Diagnosis not present

## 2022-09-12 DIAGNOSIS — R930 Abnormal findings on diagnostic imaging of skull and head, not elsewhere classified: Secondary | ICD-10-CM

## 2022-09-12 DIAGNOSIS — E669 Obesity, unspecified: Secondary | ICD-10-CM

## 2022-09-12 DIAGNOSIS — G4452 New daily persistent headache (NDPH): Secondary | ICD-10-CM | POA: Insufficient documentation

## 2022-09-12 DIAGNOSIS — Q0701 Arnold-Chiari syndrome with spina bifida: Secondary | ICD-10-CM | POA: Diagnosis not present

## 2022-09-12 LAB — CBC WITH DIFFERENTIAL/PLATELET
Basophils Absolute: 0 10*3/uL (ref 0.0–0.1)
Basophils Relative: 0.5 % (ref 0.0–3.0)
Eosinophils Absolute: 0.1 10*3/uL (ref 0.0–0.7)
Eosinophils Relative: 1.1 % (ref 0.0–5.0)
HCT: 39.1 % (ref 36.0–46.0)
Hemoglobin: 12.9 g/dL (ref 12.0–15.0)
Lymphocytes Relative: 31.7 % (ref 12.0–46.0)
Lymphs Abs: 2.4 10*3/uL (ref 0.7–4.0)
MCHC: 33.1 g/dL (ref 30.0–36.0)
MCV: 90.1 fl (ref 78.0–100.0)
Monocytes Absolute: 0.6 10*3/uL (ref 0.1–1.0)
Monocytes Relative: 8.1 % (ref 3.0–12.0)
Neutro Abs: 4.4 10*3/uL (ref 1.4–7.7)
Neutrophils Relative %: 58.6 % (ref 43.0–77.0)
Platelets: 448 10*3/uL — ABNORMAL HIGH (ref 150.0–400.0)
RBC: 4.34 Mil/uL (ref 3.87–5.11)
RDW: 14.5 % (ref 11.5–15.5)
WBC: 7.5 10*3/uL (ref 4.0–10.5)

## 2022-09-12 LAB — FOLATE: Folate: 8.5 ng/mL (ref >5.9)

## 2022-09-12 LAB — VITAMIN B12: Vitamin B-12: 327 pg/mL (ref 211–911)

## 2022-09-12 MED ORDER — FAMOTIDINE 40 MG PO TABS
40.0000 mg | ORAL_TABLET | Freq: Every day | ORAL | 1 refills | Status: DC
Start: 2022-09-12 — End: 2022-11-14

## 2022-09-12 MED ORDER — ESOMEPRAZOLE MAGNESIUM 40 MG PO CPDR
40.0000 mg | DELAYED_RELEASE_CAPSULE | Freq: Every day | ORAL | 1 refills | Status: DC
Start: 2022-09-12 — End: 2023-06-14
  Filled 2023-01-24: qty 90, 90d supply, fill #0

## 2022-09-12 NOTE — Progress Notes (Signed)
Subjective:  Patient ID: Rhonda [name], female    DOB: November 01, 1970  Age: 52 y.o. MRN: [id number]  CC: Gastroesophageal Reflux   HPI Rhonda [name] presents for f/up ----  She has a history of Chiari malformation and complains of a several month history of worsening headache.  She describes the pain as diffuse and not associated with blurred vision, nausea, vomiting, or paresthesias.  She complains of worsening heartburn and says there is a nocturnal component.  She denies odynophagia, dysphagia, loss of appetite, unexplained weight loss, abdominal pain, diarrhea, constipation, or bright red blood per rectum.   Outpatient Medications Prior to Visit  Medication Sig Dispense Refill   acetaminophen (TYLENOL) 325 MG tablet Take 325 mg by mouth every 4 (four) hours as needed for fever.     Semaglutide-Weight Management 1.7 MG/0.75ML SOAJ Inject 1.7 mg into the skin once a week for 28 days. 3 mL 0   Semaglutide-Weight Management 2.4 MG/0.75ML SOAJ Inject 2.4 mg into the skin once a week for 28 days. 3 mL 0   Fish Oil-Cholecalciferol (OMEGA-3 + VITAMIN D3 PO) Take 1 tablet by mouth daily. (Patient not taking: Reported on 05/05/2022)     esomeprazole (NEXIUM) 40 MG capsule Take 1 capsule (40 mg total) by mouth daily at 12 noon. (Patient not taking: Reported on 09/12/2022) 30 capsule 11   neomycin-polymyxin b-dexamethasone (MAXITROL) 3.5-10000-0.1 SUSP Place 2 drops into both eyes every 6 (six) hours. (Patient not taking: Reported on 09/12/2022) 5 mL 0   Semaglutide-Weight Management 0.25 MG/0.5ML SOAJ Inject 0.25 mg into the skin once a week. 2 mL 11   No facility-administered medications prior to visit.    ROS Review of Systems  Constitutional: Negative.  Negative for diaphoresis and fatigue.  HENT:  Negative for sore throat and trouble swallowing.   Eyes:  Negative for visual disturbance.  Respiratory:  Negative for cough, chest tightness, shortness of breath and wheezing.   Cardiovascular:   Negative for chest pain, palpitations and leg swelling.  Gastrointestinal:  Negative for abdominal pain, constipation, diarrhea, nausea and vomiting.  Genitourinary: Negative.  Negative for difficulty urinating.  Musculoskeletal: Negative.  Negative for arthralgias and back pain.  Skin: Negative.  Negative for color change and pallor.  Neurological:  Positive for headaches. Negative for dizziness, weakness, light-headedness and numbness.  Hematological:  Negative for adenopathy. Does not bruise/bleed easily.  Psychiatric/Behavioral: Negative.      Objective:  BP 104/68 (BP Location: Left Arm, Patient Position: Sitting, Cuff Size: Normal)   Pulse 85   Temp 98.5 F (36.9 C) (Oral)   Resp 16   Ht 5\' 5"  (1.651 m)   Wt 188 lb 12.8 oz (85.6 kg)   SpO2 99%   BMI 31.42 kg/m   BP Readings from Last 3 Encounters:  09/12/22 104/68  05/05/22 102/68  03/03/22 120/74    Wt Readings from Last 3 Encounters:  09/12/22 188 lb 12.8 oz (85.6 kg)  05/05/22 208 lb (94.3 kg)  03/03/22 211 lb (95.7 kg)    Physical Exam Vitals reviewed.  Constitutional:      Appearance: Normal appearance.  HENT:     Nose: Nose normal.     Mouth/Throat:     Mouth: Mucous membranes are moist.  Eyes:     General: No scleral icterus.    Extraocular Movements: Extraocular movements intact.     Conjunctiva/sclera: Conjunctivae normal.     Pupils: Pupils are equal, round, and reactive to light.  Cardiovascular:  Rate and Rhythm: Normal rate and regular rhythm.     Heart sounds: No murmur heard. Pulmonary:     Effort: Pulmonary effort is normal.     Breath sounds: No stridor. No wheezing, rhonchi or rales.  Abdominal:     General: Abdomen is flat.     Palpations: There is no mass.     Tenderness: There is no abdominal tenderness. There is no guarding.     Hernia: No hernia is present.  Musculoskeletal:        General: Normal range of motion.     Cervical back: Neck supple.     Right lower leg: No  edema.     Left lower leg: No edema.  Lymphadenopathy:     Cervical: No cervical adenopathy.  Skin:    General: Skin is warm and dry.  Neurological:     General: No focal deficit present.     Mental Status: She is alert and oriented to person, place, and time. Mental status is at baseline.  Psychiatric:        Mood and Affect: Mood normal.        Behavior: Behavior normal.        Thought Content: Thought content normal.        Judgment: Judgment normal.     Lab Results  Component Value Date   WBC 7.5 09/12/2022   HGB 12.9 09/12/2022   HCT 39.1 09/12/2022   PLT 448.0 (H) 09/12/2022   GLUCOSE 84 09/12/2022   CHOL 168 11/01/2021   TRIG 56.0 11/01/2021   HDL 69.70 11/01/2021   LDLCALC 87 11/01/2021   ALT 10 09/12/2022   AST 13 09/12/2022   NA 138 09/12/2022   K 3.5 09/12/2022   CL 101 09/12/2022   CREATININE 0.81 09/12/2022   BUN 6 09/12/2022   CO2 28 09/12/2022   TSH 0.55 03/07/2022   HGBA1C 5.6 11/01/2021    MR Knee Left w/o contrast  Result Date: 07/01/2021 CLINICAL DATA:  Chronic left knee pain.  Negative x-ray. EXAM: MRI OF THE LEFT KNEE WITHOUT CONTRAST TECHNIQUE: Multiplanar, multisequence MR imaging of the left knee was performed. No intravenous contrast was administered. COMPARISON:  Radiographs dated Jun 17, 2021 FINDINGS: MENISCI Medial: Mild degenerative changes without discrete tear. Lateral: Mild degenerative changes without discrete tear. LIGAMENTS Cruciates: ACL and PCL are intact. Collaterals: Medial collateral ligament is intact. Lateral collateral ligament complex is intact. CARTILAGE Patellofemoral: Mild articular thinning of the trochlea without full-thickness defect. Medial: Mild partial-thickness cartilage loss of the medial femorotibial compartment. Lateral:  No chondral defect. JOINT: Small joint effusion. Normal Hoffa's fat-pad. No plical thickening. POPLITEAL FOSSA: Popliteus tendon is intact. No Baker's cyst. EXTENSOR MECHANISM: Intact quadriceps  tendon. Intact patellar tendon. Intact lateral patellar retinaculum. Intact medial patellar retinaculum. Intact MPFL. BONES: No aggressive osseous lesion. No fracture or dislocation. Other: No fluid collection or hematoma. Muscles are normal. IMPRESSION: 1.  No evidence of acute meniscal tear. 2.  Cruciate and collateral ligaments are intact. 3.  Quadriceps tendon and patellar tendon are also intact. 4. No full-thickness cartilage defect or evidence of significant arthropathy. Electronically Signed   By: Larose Hires D.O.   On: 07/01/2021 23:30    Assessment & Plan:   Abnormal MRI of head -     MR BRAIN WO CONTRAST; Future  Chiari malformation type II (HCC) - Will get a follow-up MRI to see if there has been a complication or extension. -     MR BRAIN  WO CONTRAST; Future  Gastroesophageal reflux disease without esophagitis- Will add an H2 blocker to the PPI. -     CBC with Differential/Platelet; Future -     Basic metabolic panel; Future -     Famotidine; Take 1 tablet (40 mg total) by mouth at bedtime.  Dispense: 90 tablet; Refill: 1 -     Esomeprazole Magnesium; Take 1 capsule (40 mg total) by mouth daily at 12 noon.  Dispense: 90 capsule; Refill: 1  Obesity (BMI 35.0-39.9 without comorbidity) -     Hepatic function panel; Future -     Basic metabolic panel; Future  Vitamin B 12 deficiency -     CBC with Differential/Platelet; Future -     Basic metabolic panel; Future -     Folate; Future -     Vitamin B12; Future  New daily persistent headache -     MR BRAIN WO CONTRAST; Future     Follow-up: Return in about 6 months (around 03/15/2023).  Sanda Linger, MD

## 2022-09-12 NOTE — Patient Instructions (Signed)
 Gastroesophageal Reflux Disease, Adult    Gastroesophageal reflux (GER) happens when acid from the stomach flows up into the tube that connects the mouth and the stomach (esophagus). Normally, food travels down the esophagus and stays in the stomach to be digested. However, when a person has GER, food and stomach acid sometimes move back up into the esophagus. If this becomes a more serious problem, the person may be diagnosed with a disease called gastroesophageal reflux disease (GERD). GERD occurs when the reflux:  Happens often.  Causes frequent or severe symptoms.  Causes problems such as damage to the esophagus.  When stomach acid comes in contact with the esophagus, the acid may cause inflammation in the esophagus. Over time, GERD may create small holes (ulcers) in the lining of the esophagus.  What are the causes?  This condition is caused by a problem with the muscle between the esophagus and the stomach (lower esophageal sphincter, or LES). Normally, the LES muscle closes after food passes through the esophagus to the stomach. When the LES is weakened or abnormal, it does not close properly, and that allows food and stomach acid to go back up into the esophagus.  The LES can be weakened by certain dietary substances, medicines, and medical conditions, including:  Tobacco use.  Pregnancy.  Having a hiatal hernia.  Alcohol use.  Certain foods and beverages, such as coffee, chocolate, onions, and peppermint.  What increases the risk?  You are more likely to develop this condition if you:  Have an increased body weight.  Have a connective tissue disorder.  Take NSAIDs, such as ibuprofen.  What are the signs or symptoms?  Symptoms of this condition include:  Heartburn.  Difficult or painful swallowing and the feeling of having a lump in the throat.  A bitter taste in the mouth.  Bad breath and having a large amount of saliva.  Having an upset or bloated stomach and belching.  Chest pain. Different conditions can  cause chest pain. Make sure you see your health care provider if you experience chest pain.  Shortness of breath or wheezing.  Ongoing (chronic) cough or a nighttime cough.  Wearing away of tooth enamel.  Weight loss.  How is this diagnosed?  This condition may be diagnosed based on a medical history and a physical exam. To determine if you have mild or severe GERD, your health care provider may also monitor how you respond to treatment. You may also have tests, including:  A test to examine your stomach and esophagus with a small camera (endoscopy).  A test that measures the acidity level in your esophagus.  A test that measures how much pressure is on your esophagus.  A barium swallow or modified barium swallow test to show the shape, size, and functioning of your esophagus.  How is this treated?  Treatment for this condition may vary depending on how severe your symptoms are. Your health care provider may recommend:  Changes to your diet.  Medicine.  Surgery.  The goal of treatment is to help relieve your symptoms and to prevent complications.  Follow these instructions at home:  Eating and drinking    Follow a diet as recommended by your health care provider. This may involve avoiding foods and drinks such as:  Coffee and tea, with or without caffeine.  Drinks that contain alcohol.  Energy drinks and sports drinks.  Carbonated drinks or sodas.  Chocolate and cocoa.  Peppermint and mint flavorings.  Garlic and onions.  Horseradish.  Spicy and acidic foods, including peppers, chili powder, curry powder, vinegar, hot sauces, and barbecue sauce.  Citrus fruit juices and citrus fruits, such as oranges, lemons, and limes.  Tomato-based foods, such as red sauce, chili, salsa, and pizza with red sauce.  Fried and fatty foods, such as donuts, french fries, potato chips, and high-fat dressings.  High-fat meats, such as hot dogs and fatty cuts of red and white meats, such as rib eye steak, sausage, ham, and  bacon.  High-fat dairy items, such as whole milk, butter, and cream cheese.  Eat small, frequent meals instead of large meals.  Avoid drinking large amounts of liquid with your meals.  Avoid eating meals during the 2-3 hours before bedtime.  Avoid lying down right after you eat.  Do not exercise right after you eat.  Lifestyle    Do not use any products that contain nicotine or tobacco. These products include cigarettes, chewing tobacco, and vaping devices, such as e-cigarettes. If you need help quitting, ask your health care provider.  Try to reduce your stress by using methods such as yoga or meditation. If you need help reducing stress, ask your health care provider.  If you are overweight, reduce your weight to an amount that is healthy for you. Ask your health care provider for guidance about a safe weight loss goal.  General instructions  Pay attention to any changes in your symptoms.  Take over-the-counter and prescription medicines only as told by your health care provider. Do not take aspirin, ibuprofen, or other NSAIDs unless your health care provider told you to take these medicines.  Wear loose-fitting clothing. Do not wear anything tight around your waist that causes pressure on your abdomen.  Raise (elevate) the head of your bed about 6 inches (15 cm). You can use a wedge to do this.  Avoid bending over if this makes your symptoms worse.  Keep all follow-up visits. This is important.  Contact a health care provider if:  You have:  New symptoms.  Unexplained weight loss.  Difficulty swallowing or it hurts to swallow.  Wheezing or a persistent cough.  A hoarse voice.  Your symptoms do not improve with treatment.  Get help right away if:  You have sudden pain in your arms, neck, jaw, teeth, or back.  You suddenly feel sweaty, dizzy, or light-headed.  You have chest pain or shortness of breath.  You vomit and the vomit is green, yellow, or black, or it looks like blood or coffee grounds.  You faint.  You  have stool that is red, bloody, or black.  You cannot swallow, drink, or eat.  These symptoms may represent a serious problem that is an emergency. Do not wait to see if the symptoms will go away. Get medical help right away. Call your local emergency services (911 in the [place] Do not drive yourself to the hospital.  Summary  Gastroesophageal reflux happens when acid from the stomach flows up into the esophagus. GERD is a disease in which the reflux happens often, causes frequent or severe symptoms, or causes problems such as damage to the esophagus.  Treatment for this condition may vary depending on how severe your symptoms are. Your health care provider may recommend diet and lifestyle changes, medicine, or surgery.  Contact a health care provider if you have new or worsening symptoms.  Take over-the-counter and prescription medicines only as told by your health care provider. Do not take aspirin, ibuprofen, or other NSAIDs  unless your health care provider told you to do so.  Keep all follow-up visits as told by your health care provider. This is important.  This information is not intended to replace advice given to you by your health care provider. Make sure you discuss any questions you have with your health care provider.  Document Revised: 07/20/2019 Document Reviewed: 07/22/2019  Elsevier Patient Education  2024 [employer].

## 2022-09-13 LAB — BASIC METABOLIC PANEL
BUN: 6 mg/dL (ref 6–23)
CO2: 28 meq/L (ref 19–32)
Calcium: 9.4 mg/dL (ref 8.4–10.5)
Chloride: 101 meq/L (ref 96–112)
Creatinine, Ser: 0.81 mg/dL (ref 0.40–1.20)
GFR: 83.13 mL/min (ref >60.00)
Glucose, Bld: 84 mg/dL (ref 70–99)
Potassium: 3.5 meq/L (ref 3.5–5.1)
Sodium: 138 meq/L (ref 135–145)

## 2022-09-13 LAB — HEPATIC FUNCTION PANEL
ALT: 10 U/L (ref 0–35)
AST: 13 U/L (ref 0–37)
Albumin: 3.9 g/dL (ref 3.5–5.2)
Alkaline Phosphatase: 90 U/L (ref 39–117)
Bilirubin, Direct: 0.1 mg/dL (ref 0.0–0.3)
Total Bilirubin: 0.8 mg/dL (ref 0.2–1.2)
Total Protein: 7.5 g/dL (ref 6.0–8.3)

## 2022-09-19 ENCOUNTER — Ambulatory Visit (INDEPENDENT_AMBULATORY_CARE_PROVIDER_SITE_OTHER): Payer: Federal, State, Local not specified - PPO

## 2022-09-19 DIAGNOSIS — E538 Deficiency of other specified B group vitamins: Secondary | ICD-10-CM | POA: Diagnosis not present

## 2022-09-19 MED ORDER — CYANOCOBALAMIN 1000 MCG/ML IJ SOLN
1000.0000 ug | Freq: Once | INTRAMUSCULAR | Status: AC
Start: 2022-09-19 — End: 2022-09-19
  Administered 2022-09-19: 1000 ug via INTRAMUSCULAR

## 2022-09-19 NOTE — Progress Notes (Signed)
B12 given.  Pt tolerated well. Pt is aware to give the office a call for an side effects or reactions. Please co-sign.   

## 2022-09-20 DIAGNOSIS — Z713 Dietary counseling and surveillance: Secondary | ICD-10-CM | POA: Diagnosis not present

## 2022-09-20 DIAGNOSIS — Z6830 Body mass index (BMI) 30.0-30.9, adult: Secondary | ICD-10-CM | POA: Diagnosis not present

## 2022-10-01 ENCOUNTER — Other Ambulatory Visit (HOSPITAL_BASED_OUTPATIENT_CLINIC_OR_DEPARTMENT_OTHER): Payer: Self-pay

## 2022-10-08 ENCOUNTER — Ambulatory Visit
Admission: RE | Admit: 2022-10-08 | Discharge: 2022-10-08 | Disposition: A | Discharge status: Home / Self Care | Payer: Federal, State, Local not specified - PPO | Source: Ambulatory Visit | Attending: Internal Medicine | Admitting: Internal Medicine

## 2022-10-08 DIAGNOSIS — Q0701 Arnold-Chiari syndrome with spina bifida: Secondary | ICD-10-CM

## 2022-10-08 DIAGNOSIS — R519 Headache, unspecified: Secondary | ICD-10-CM | POA: Diagnosis not present

## 2022-10-08 DIAGNOSIS — G4452 New daily persistent headache (NDPH): Secondary | ICD-10-CM

## 2022-10-08 DIAGNOSIS — R930 Abnormal findings on diagnostic imaging of skull and head, not elsewhere classified: Secondary | ICD-10-CM

## 2022-10-08 DIAGNOSIS — R9082 White matter disease, unspecified: Secondary | ICD-10-CM | POA: Diagnosis not present

## 2022-10-12 ENCOUNTER — Encounter: Payer: Self-pay | Admitting: Internal Medicine

## 2022-10-20 ENCOUNTER — Ambulatory Visit: Payer: Federal, State, Local not specified - PPO

## 2022-10-20 DIAGNOSIS — E538 Deficiency of other specified B group vitamins: Secondary | ICD-10-CM | POA: Diagnosis not present

## 2022-10-20 MED ORDER — CYANOCOBALAMIN 1000 MCG/ML IJ SOLN
1000.0000 ug | Freq: Once | INTRAMUSCULAR | Status: AC
Start: 2022-10-20 — End: 2022-10-20
  Administered 2022-10-20: 1000 ug via INTRAMUSCULAR

## 2022-10-20 NOTE — Progress Notes (Signed)
Pt here for monthly B12 injection per dr. Yetta Barre   B12 given IM and pt tolerated injection well.  Patient was advised to report to the office if she notices any adverse reactions.

## 2022-10-21 ENCOUNTER — Ambulatory Visit: Payer: Federal, State, Local not specified - PPO | Admitting: Nurse Practitioner

## 2022-11-14 ENCOUNTER — Ambulatory Visit: Payer: Federal, State, Local not specified - PPO | Admitting: Internal Medicine

## 2022-11-14 ENCOUNTER — Encounter: Payer: Self-pay | Admitting: Internal Medicine

## 2022-11-14 VITALS — BP 114/72 | HR 88 | Temp 98.2°F | Resp 16 | Ht 65.0 in | Wt 180.5 lb

## 2022-11-14 DIAGNOSIS — D75839 Thrombocytosis, unspecified: Secondary | ICD-10-CM | POA: Insufficient documentation

## 2022-11-14 DIAGNOSIS — L2084 Intrinsic (allergic) eczema: Secondary | ICD-10-CM | POA: Diagnosis not present

## 2022-11-14 DIAGNOSIS — M542 Cervicalgia: Secondary | ICD-10-CM | POA: Diagnosis not present

## 2022-11-14 DIAGNOSIS — Z23 Encounter for immunization: Secondary | ICD-10-CM

## 2022-11-14 DIAGNOSIS — R052 Subacute cough: Secondary | ICD-10-CM | POA: Insufficient documentation

## 2022-11-14 DIAGNOSIS — E876 Hypokalemia: Secondary | ICD-10-CM | POA: Insufficient documentation

## 2022-11-14 LAB — CBC WITH DIFFERENTIAL/PLATELET
Basophils Absolute: 0 10*3/uL (ref 0.0–0.1)
Basophils Relative: 0.6 % (ref 0.0–3.0)
Eosinophils Absolute: 0.1 10*3/uL (ref 0.0–0.7)
Eosinophils Relative: 0.8 % (ref 0.0–5.0)
HCT: 40.9 % (ref 36.0–46.0)
Hemoglobin: 13.5 g/dL (ref 12.0–15.0)
Lymphocytes Relative: 29.8 % (ref 12.0–46.0)
Lymphs Abs: 2 10*3/uL (ref 0.7–4.0)
MCHC: 33 g/dL (ref 30.0–36.0)
MCV: 89.7 fL (ref 78.0–100.0)
Monocytes Absolute: 0.4 10*3/uL (ref 0.1–1.0)
Monocytes Relative: 6.1 % (ref 3.0–12.0)
Neutro Abs: 4.2 10*3/uL (ref 1.4–7.7)
Neutrophils Relative %: 62.7 % (ref 43.0–77.0)
Platelets: 426 10*3/uL — ABNORMAL HIGH (ref 150.0–400.0)
RBC: 4.56 Mil/uL (ref 3.87–5.11)
RDW: 14 % (ref 11.5–15.5)
WBC: 6.7 10*3/uL (ref 4.0–10.5)

## 2022-11-14 LAB — BASIC METABOLIC PANEL
BUN: 6 mg/dL (ref 6–23)
CO2: 30 meq/L (ref 19–32)
Calcium: 9.7 mg/dL (ref 8.4–10.5)
Chloride: 100 meq/L (ref 96–112)
Creatinine, Ser: 0.83 mg/dL (ref 0.40–1.20)
GFR: 80.63 mL/min (ref >60.00)
Glucose, Bld: 106 mg/dL — ABNORMAL HIGH (ref 70–99)
Potassium: 3.3 meq/L — ABNORMAL LOW (ref 3.5–5.1)
Sodium: 140 meq/L (ref 135–145)

## 2022-11-14 LAB — C-REACTIVE PROTEIN: CRP: 1.4 mg/dL (ref 0.5–20.0)

## 2022-11-14 MED ORDER — POTASSIUM CHLORIDE CRYS ER 20 MEQ PO TBCR: 20.0000 meq | EXTENDED_RELEASE_TABLET | Freq: Two times a day (BID) | ORAL | 0 refills | Status: DC

## 2022-11-14 MED ORDER — TRIAMCINOLONE ACETONIDE 0.5 % EX CREA: 1.0000 | TOPICAL_CREAM | Freq: Two times a day (BID) | CUTANEOUS | 1 refills | Status: DC

## 2022-11-14 NOTE — Progress Notes (Signed)
Subjective:  Patient ID: Rhonda [name], female    DOB: [birth year]  Age: 52 y.o. MRN: [id number]  CC: Cough   HPI Rhonda [name] presents for f/up -----  Discussed the use of AI scribe software for clinical note transcription with the patient, who gave verbal consent to proceed.  History of Present Illness   The patient presents with a two-week history of pain in the right/front of the neck, which she describes as feeling like it's under the neck, possibly in the area of a lymph node on the right side. She also reports concurrent ear pain. She denies any associated fever or chills but notes the presence of a rash on the neck, which has been itchy. The rash is described as scaly. The patient has been applying hydrocortisone cream but reports no improvement.  In addition to the neck pain and rash, the patient has been experiencing periodic coughing, without any productive sputum. She denies any associated shortness of breath or night sweats. The patient also reports a history of ear infections, for which she previously saw an ENT specialist. She mentions that the ear has been bothering her and that there was a time last week when the pain was so severe that she couldn't insert a Q-tip.  The patient denies any difficulty swallowing or sore throat. She expresses a desire for a referral to see a specialist about her current symptoms.       Outpatient Medications Prior to Visit  Medication Sig Dispense Refill   acetaminophen (TYLENOL) 325 MG tablet Take 325 mg by mouth every 4 (four) hours as needed for fever.     esomeprazole (NEXIUM) 40 MG capsule Take 1 capsule (40 mg total) by mouth daily at 12 noon. 90 capsule 1   famotidine (PEPCID) 40 MG tablet Take 1 tablet (40 mg total) by mouth at bedtime. 90 tablet 1   Fish Oil-Cholecalciferol (OMEGA-3 + VITAMIN D3 PO) Take 1 tablet by mouth daily. (Patient not taking: Reported on 05/05/2022)     No facility-administered medications prior to visit.     ROS Review of Systems  Constitutional:  Negative for appetite change, chills, diaphoresis, fatigue and unexpected weight change.  HENT:  Positive for ear pain. Negative for sore throat and trouble swallowing.   Eyes:  Negative for visual disturbance.  Respiratory:  Positive for cough. Negative for chest tightness, shortness of breath and wheezing.   Cardiovascular:  Negative for chest pain, palpitations and leg swelling.  Gastrointestinal:  Negative for abdominal pain, constipation, diarrhea, nausea and vomiting.  Genitourinary: Negative.  Negative for difficulty urinating.  Musculoskeletal: Negative.  Negative for back pain and myalgias.  Skin:  Positive for rash. Negative for color change.  Neurological:  Negative for dizziness and weakness.  Hematological:  Negative for adenopathy. Does not bruise/bleed easily.  Psychiatric/Behavioral: Negative.      Objective:  BP 114/72   Pulse 88   Temp 98.2 F (36.8 C) (Temporal)   Resp 16   Ht 5\' 5"  (1.651 m)   Wt 180 lb 8 oz (81.9 kg)   SpO2 98%   BMI 30.04 kg/m   BP Readings from Last 3 Encounters:  11/14/22 114/72  09/12/22 104/68  05/05/22 102/68    Wt Readings from Last 3 Encounters:  11/14/22 180 lb 8 oz (81.9 kg)  09/12/22 188 lb 12.8 oz (85.6 kg)  05/05/22 208 lb (94.3 kg)    Physical Exam Vitals reviewed.  Constitutional:      Appearance: Normal  appearance.  HENT:     Right Ear: Hearing, tympanic membrane, ear canal and external ear normal. No middle ear effusion.     Left Ear: Hearing, tympanic membrane, ear canal and external ear normal.  No middle ear effusion.     Mouth/Throat:     Mouth: Mucous membranes are moist.  Eyes:     General: No scleral icterus.    Conjunctiva/sclera: Conjunctivae normal.  Neck:     Thyroid: No thyroid mass, thyromegaly or thyroid tenderness.  Cardiovascular:     Rate and Rhythm: Normal rate.     Pulses: Normal pulses.     Heart sounds: No murmur heard.    No friction rub.  No gallop.  Pulmonary:     Effort: Pulmonary effort is normal.     Breath sounds: No stridor. No wheezing, rhonchi or rales.  Abdominal:     General: Abdomen is flat.     Palpations: There is no mass.     Tenderness: There is no abdominal tenderness. There is no guarding.     Hernia: No hernia is present.  Musculoskeletal:        General: No swelling. Normal range of motion.     Cervical back: Normal range of motion. No swelling, edema, erythema or tenderness.     Lumbar back: Normal.     Right lower leg: No edema.     Left lower leg: No edema.  Lymphadenopathy:     Head:     Right side of head: No submental, submandibular, tonsillar, preauricular, posterior auricular or occipital adenopathy.     Left side of head: No submental, submandibular, tonsillar, preauricular, posterior auricular or occipital adenopathy.     Cervical: No cervical adenopathy.     Right cervical: No superficial, deep or posterior cervical adenopathy.    Left cervical: No superficial, deep or posterior cervical adenopathy.     Upper Body:     Right upper body: No supraclavicular or axillary adenopathy.     Left upper body: No supraclavicular or axillary adenopathy.  Skin:    Findings: Rash present. No lesion.  Neurological:     General: No focal deficit present.     Mental Status: She is alert.  Psychiatric:        Mood and Affect: Mood normal.        Behavior: Behavior normal.     Lab Results  Component Value Date   WBC 6.7 11/14/2022   HGB 13.5 11/14/2022   HCT 40.9 11/14/2022   PLT 426.0 (H) 11/14/2022   GLUCOSE 106 (H) 11/14/2022   CHOL 168 11/01/2021   TRIG 56.0 11/01/2021   HDL 69.70 11/01/2021   LDLCALC 87 11/01/2021   ALT 10 09/12/2022   AST 13 09/12/2022   NA 140 11/14/2022   K 3.3 (L) 11/14/2022   CL 100 11/14/2022   CREATININE 0.83 11/14/2022   BUN 6 11/14/2022   CO2 30 11/14/2022   TSH 0.55 03/07/2022   HGBA1C 5.6 11/01/2021    MR Brain Wo Contrast  Result Date:  10/27/2022 CLINICAL DATA:  Headache, new onset (Age >= 51y). History of Chiari malformation. EXAM: MRI HEAD WITHOUT CONTRAST TECHNIQUE: Multiplanar, multiecho pulse sequences of the brain and surrounding structures were obtained without intravenous contrast. COMPARISON:  Head MRI 10/05/2016 FINDINGS: Brain: There is extensive magnetic susceptibility artifact from orthodontic braces. This is particularly notable on diffusion weighted and susceptibility weighted imaging where large portions of the posterior fossa and frontal and temporal lobes are obscured,  limiting assessment for acute infarct and hemorrhage. Within this limitation, no acute infarct, intracranial hemorrhage, mass, midline shift, or extra-axial fluid collection is identified. Scattered small T2 hyperintensities in the cerebral white matter are stable to minimally increased. The ventricles are normal in size. The cerebellar tonsils extend 5 mm below the foramen magnum, unchanged and with a normal rounded morphology. Vascular: Major intracranial vascular flow voids are preserved. Skull and upper cervical spine: Unremarkable bone marrow signal. No syrinx in the included upper cervical spinal cord. Sinuses/Orbits: Grossly unremarkable orbits. The paranasal sinuses are largely obscured. Clear mastoid air cells. Other: None. IMPRESSION: 1. No acute intracranial abnormality within limitations of artifact from braces. 2. Unchanged 5 mm cerebellar tonsillar ectopia/borderline Chiari I malformation. 3. Mild cerebral white matter T2 signal changes, nonspecific though may reflect early chronic small vessel ischemia, migraines, or prior infection/inflammation. Electronically Signed   By: Sebastian Ache M.D.   On: 10/27/2022 16:43    Assessment & Plan:  Flu vaccine need -     Flu vaccine trivalent PF, 6mos and older(Flulaval,Afluria,Fluarix,Fluzone)  Thrombocytosis- Will evaluate for a malignant process. -     Lactate dehydrogenase; Future -     CBC with  Differential/Platelet; Future -     C-reactive protein; Future -     Protein electrophoresis, serum; Future -     CT SOFT TISSUE NECK W CONTRAST; Future -     Basic metabolic panel; Future  Intrinsic eczema -     Triamcinolone Acetonide; Apply 1 Application topically 2 (two) times daily.  Dispense: 30 g; Refill: 1  Anterior neck pain - Exam is reassuring. Will evaluate for malignancy. -     CT SOFT TISSUE NECK W CONTRAST; Future -     Basic metabolic panel; Future  Chronic hypokalemia -     Potassium Chloride Crys ER; Take 1 tablet (20 mEq total) by mouth 2 (two) times daily.  Dispense: 180 tablet; Refill: 0  Subacute cough -     DG Chest 2 View; Future     Follow-up: Return in about 3 months (around 02/14/2023).  Sanda Linger, MD

## 2022-11-14 NOTE — Patient Instructions (Signed)
Reactive Thrombocytosis Reactive thrombocytosis is condition in which a person has too many platelets (thrombocytes) in the blood. Platelets are parts of blood that stick together and form a clot (thrombus) to help the body stop bleeding after an injury. Conditions that cause inflammation, such as cancer, may trigger your body to make more platelets than normal. This condition may also be called secondary thrombocytosis. What are the causes? This condition may be caused by: Having your spleen surgically removed (splenectomy). An injury or severe bleeding. Certain infections. Low red blood cell count from not having enough iron (iron deficiency anemia). Having a disease that destroys your red blood cells (hemolytic anemia). Not having enough vitamin B12 in your body. Inflammatory bowel disease (Crohn's disease or ulcerative colitis). Cancer, especially lymphoma, breast, stomach, and ovarian cancers. Other causes of this condition include alcohol misuse and taking certain medicines. What are the signs or symptoms? It can be hard to tell the difference between symptoms of reactive thrombocytosis and symptoms of the underlying condition causing it. Symptoms of this condition may include: Weakness. Headache. Dizziness or confusion. Chest pain or shortness of breath. Tingling or burning in your hands or feet. How is this diagnosed? This condition may be diagnosed based on routine blood tests or while you are being evaluated for another condition. You may need tests to confirm the diagnosis. These may include: More blood tests. A procedure to collect a sample of bone marrow (bone marrow aspiration). How is this treated? Treatment for this condition depends on the cause. Your platelet count may return to normal after the underlying cause is treated. If your platelet count is very high, you may have to take medicine to prevent blood clots as told by your health care provider. Follow these  instructions at home:  Take over-the-counter and prescription medicines only as told by your health care provider. Work with your health care provider to: Treat the condition that is causing reactive thrombocytosis. Control any other conditions you may have, such as high blood pressure, high cholesterol, and diabetes. Do not use any products that contain nicotine or tobacco. These products include cigarettes, chewing tobacco, and vaping devices, such as e-cigarettes. If you need help quitting, ask your health care provider. Keep all follow-up visits. This is important. Contact a health care provider if: You have a severe headache, and medicines do not help. You faint. Get help right away if: You have chest pain. You have trouble breathing. You have any symptoms of a stroke. "BE FAST" is an easy way to remember the main warning signs of a stroke: B - Balance. Signs are dizziness, sudden trouble walking, or loss of balance. E - Eyes. Signs are trouble seeing or a sudden change in vision. F - Face. Signs are sudden weakness or numbness of the face, or the face or eyelid drooping on one side. A - Arm. Signs are weakness or numbness in an arm. This happens suddenly and usually on one side of the body. S - Speech. Signs are sudden trouble speaking, slurred speech, or trouble understanding what people say. T - Time. Time to call emergency services. Write down what time symptoms started. You have other signs of a stroke, such as: A sudden, severe headache with no known cause. Nausea or vomiting. Seizure. These symptoms may represent a serious problem that is an emergency. Do not wait to see if the symptoms will go away. Get medical help right away. Call your local emergency services (911 in the [place] Do not drive  yourself to the hospital. Summary Reactive thrombocytosis is when you have too many platelets (thrombocytes) in your blood. Platelets help your body stop bleeding. Conditions that  cause inflammation may trigger your body to make more platelets than normal. Treatment for this condition depends on the cause. This information is not intended to replace advice given to you by your health care provider. Make sure you discuss any questions you have with your health care provider. Document Revised: 07/13/2020 Document Reviewed: 07/13/2020 Elsevier Patient Education  2024 [employer].

## 2022-11-17 ENCOUNTER — Other Ambulatory Visit: Payer: Self-pay | Admitting: Internal Medicine

## 2022-11-17 ENCOUNTER — Telehealth: Payer: Self-pay | Admitting: Internal Medicine

## 2022-11-17 DIAGNOSIS — D75839 Thrombocytosis, unspecified: Secondary | ICD-10-CM

## 2022-11-17 LAB — PROTEIN ELECTROPHORESIS, SERUM
Albumin ELP: 3.9 g/dL (ref 3.8–4.8)
Alpha 1: 0.3 g/dL (ref 0.2–0.3)
Alpha 2: 0.7 g/dL (ref 0.5–0.9)
Beta 2: 0.5 g/dL (ref 0.2–0.5)
Beta Globulin: 0.5 g/dL (ref 0.4–0.6)
Gamma Globulin: 1.1 g/dL (ref 0.8–1.7)
Total Protein: 7 g/dL (ref 6.1–8.1)

## 2022-11-17 LAB — LACTATE DEHYDROGENASE: LDH: 124 U/L (ref 120–250)

## 2022-11-17 NOTE — Telephone Encounter (Signed)
Please advise 

## 2022-11-17 NOTE — Telephone Encounter (Signed)
Pt called wanting to know can if Dr. Yetta Barre could prescribe her some potassium pills and refer her to an hematologist base off her lab results. Please advise.

## 2022-11-18 NOTE — Telephone Encounter (Signed)
Patient is aware, gave a verbal understanding.

## 2022-11-21 ENCOUNTER — Ambulatory Visit (INDEPENDENT_AMBULATORY_CARE_PROVIDER_SITE_OTHER): Payer: Federal, State, Local not specified - PPO

## 2022-11-21 ENCOUNTER — Ambulatory Visit: Payer: Federal, State, Local not specified - PPO

## 2022-11-21 DIAGNOSIS — E538 Deficiency of other specified B group vitamins: Secondary | ICD-10-CM | POA: Diagnosis not present

## 2022-11-21 DIAGNOSIS — R052 Subacute cough: Secondary | ICD-10-CM

## 2022-11-21 DIAGNOSIS — R059 Cough, unspecified: Secondary | ICD-10-CM | POA: Diagnosis not present

## 2022-11-21 MED ORDER — CYANOCOBALAMIN 1000 MCG/ML IJ SOLN
1000.0000 ug | Freq: Once | INTRAMUSCULAR | Status: AC
  Administered 2022-11-21: 1000 ug via INTRAMUSCULAR

## 2022-11-21 NOTE — Progress Notes (Signed)
Pt received B12 injection w/o any complications.

## 2022-11-25 ENCOUNTER — Ambulatory Visit
Admission: RE | Admit: 2022-11-25 | Discharge: 2022-11-25 | Disposition: A | Discharge status: Home / Self Care | Payer: Federal, State, Local not specified - PPO | Source: Ambulatory Visit | Attending: Internal Medicine | Admitting: Internal Medicine

## 2022-11-25 DIAGNOSIS — Z9089 Acquired absence of other organs: Secondary | ICD-10-CM | POA: Diagnosis not present

## 2022-11-25 DIAGNOSIS — H9201 Otalgia, right ear: Secondary | ICD-10-CM | POA: Diagnosis not present

## 2022-11-25 DIAGNOSIS — D75839 Thrombocytosis, unspecified: Secondary | ICD-10-CM

## 2022-11-25 DIAGNOSIS — M542 Cervicalgia: Secondary | ICD-10-CM

## 2022-11-25 DIAGNOSIS — R221 Localized swelling, mass and lump, neck: Secondary | ICD-10-CM | POA: Diagnosis not present

## 2022-11-25 MED ORDER — IOPAMIDOL (ISOVUE-300) INJECTION 61%
75.0000 mL | Freq: Once | INTRAVENOUS | Status: AC | PRN
Start: 2022-11-25 — End: 2022-11-25
  Administered 2022-11-25: 75 mL via INTRAVENOUS

## 2022-11-30 ENCOUNTER — Encounter: Payer: Self-pay | Admitting: Internal Medicine

## 2022-11-30 ENCOUNTER — Inpatient Hospital Stay: Payer: Federal, State, Local not specified - PPO

## 2022-11-30 ENCOUNTER — Inpatient Hospital Stay: Payer: Federal, State, Local not specified - PPO | Attending: Internal Medicine | Admitting: Internal Medicine

## 2022-11-30 ENCOUNTER — Other Ambulatory Visit: Payer: Self-pay | Admitting: *Deleted

## 2022-11-30 VITALS — BP 118/69 | HR 88 | Temp 97.9°F | Resp 18 | Wt 180.0 lb

## 2022-11-30 DIAGNOSIS — K5900 Constipation, unspecified: Secondary | ICD-10-CM | POA: Insufficient documentation

## 2022-11-30 DIAGNOSIS — D75839 Thrombocytosis, unspecified: Secondary | ICD-10-CM

## 2022-11-30 DIAGNOSIS — E538 Deficiency of other specified B group vitamins: Secondary | ICD-10-CM | POA: Diagnosis not present

## 2022-11-30 LAB — CBC WITH DIFFERENTIAL (CANCER CENTER ONLY)
Abs Immature Granulocytes: 0.01 10*3/uL (ref 0.00–0.07)
Basophils Absolute: 0 10*3/uL (ref 0.0–0.1)
Basophils Relative: 1 %
Eosinophils Absolute: 0.2 10*3/uL (ref 0.0–0.5)
Eosinophils Relative: 2 %
HCT: 39.3 % (ref 36.0–46.0)
Hemoglobin: 13.1 g/dL (ref 12.0–15.0)
Immature Granulocytes: 0 %
Lymphocytes Relative: 29 %
Lymphs Abs: 2 10*3/uL (ref 0.7–4.0)
MCH: 29.9 pg (ref 26.0–34.0)
MCHC: 33.3 g/dL (ref 30.0–36.0)
MCV: 89.7 fL (ref 80.0–100.0)
Monocytes Absolute: 0.5 10*3/uL (ref 0.1–1.0)
Monocytes Relative: 8 %
Neutro Abs: 4.2 10*3/uL (ref 1.7–7.7)
Neutrophils Relative %: 60 %
Platelet Count: 392 10*3/uL (ref 150–400)
RBC: 4.38 MIL/uL (ref 3.87–5.11)
RDW: 13.6 % (ref 11.5–15.5)
WBC Count: 6.9 10*3/uL (ref 4.0–10.5)
nRBC: 0 % (ref 0.0–0.2)

## 2022-11-30 LAB — CMP (CANCER CENTER ONLY)
ALT: 8 U/L (ref 0–44)
AST: 11 U/L — ABNORMAL LOW (ref 15–41)
Albumin: 4.1 g/dL (ref 3.5–5.0)
Alkaline Phosphatase: 96 U/L (ref 38–126)
Anion gap: 7 (ref 5–15)
BUN: 7 mg/dL (ref 6–20)
CO2: 28 mmol/L (ref 22–32)
Calcium: 9.3 mg/dL (ref 8.9–10.3)
Chloride: 104 mmol/L (ref 98–111)
Creatinine: 0.73 mg/dL (ref 0.44–1.00)
GFR, Estimated: >60 mL/min (ref >60)
Glucose, Bld: 96 mg/dL (ref 70–99)
Potassium: 3.8 mmol/L (ref 3.5–5.1)
Sodium: 139 mmol/L (ref 135–145)
Total Bilirubin: 0.7 mg/dL (ref <1.2)
Total Protein: 7.7 g/dL (ref 6.5–8.1)

## 2022-11-30 LAB — LACTATE DEHYDROGENASE: LDH: 127 U/L (ref 98–192)

## 2022-11-30 MED ORDER — INTEGRA PLUS PO CAPS
ORAL_CAPSULE | ORAL | 3 refills | Status: DC
Start: 2022-11-30 — End: 2023-08-14

## 2022-11-30 NOTE — Progress Notes (Signed)
[facility] [phone]   [phone]  CONSULT NOTE  REFERRING PHYSICIAN: Dr. Sanda Linger  REASON FOR CONSULTATION:  52 years old African-American female with thrombocytosis  HPI Rhonda [name] is a 52 y.o. female. Discussed the use of AI scribe software for clinical note transcription with the patient, who gave verbal consent to proceed.  History of Present Illness   The patient, a 52 year old individual with a history of B12 deficiency managed with monthly injections, presents for evaluation of persistently elevated platelet counts. This hematological issue has been ongoing for several years and has been a source of concern for the patient, as her primary care physician has been unable to provide a satisfactory explanation.  In addition to the elevated platelet count, the patient reports a history of mild iron deficiency, which she was attributed to the B12 deficiency. She has previously tried iron supplements but discontinued due to gastrointestinal side effects, including severe constipation. The patient continues to menstruate regularly, with a cycle lasting about five days. She reports the presence of clots during urination in this period but denies heavy bleeding.  Recently, the patient has noticed unexplained bruising on her legs and arms, which appear without any recollection of trauma. She also reports tingling in her fingers and feet. The patient has a history of carpal tunnel syndrome, which could potentially explain the tingling in the fingers.  The patient has also been experiencing a sensation of something in her throat, for which a CT scan was recently performed, but results are pending. She reports constipation, which she attributes to a medication called Wegovy, which she no longer takes. The patient denies any other significant medical issues.  The patient works for the [employer] and denies any history of smoking, alcohol, or  drug use. She has a family history of heart disease, high blood pressure, diabetes, and kidney cancer. The patient has lost 45 pounds recently due to the use of Wegovy. She has no known drug allergies except for Diflucan, which causes blisters.      HPI  Past Medical History:  Diagnosis Date   Allergic rhinitis    Allergy    B12 deficiency    Carpal tunnel syndrome    Dysfunction of eustachian tube     Past Surgical History:  Procedure Laterality Date   BUNIONECTOMY     right    CHOLECYSTECTOMY N/A 09/25/2015   Procedure: LAPAROSCOPIC CHOLECYSTECTOMY WITH INTRAOPERATIVE CHOLANGIOGRAM;  Surgeon: Ovidio Kin, MD;  Location: [facility];  Service: General;  Laterality: N/A;   TONSILLECTOMY      Family History  Problem Relation Age of Onset   Heart disease Father    Hypertension Father    Diabetes Father    Hyperlipidemia Mother    Hypertension Mother    Heart disease Maternal Grandmother    Diabetes Brother    Headache Neg Hx    Colon cancer Neg Hx    Prostate cancer Neg Hx    Rectal cancer Neg Hx    Stomach cancer Neg Hx    Esophageal cancer Neg Hx     Social History Social History   Tobacco Use   Smoking status: Never   Smokeless tobacco: Never  Vaping Use   Vaping status: Never Used  Substance Use Topics   Alcohol use: No   Drug use: No    Allergies  Allergen Reactions   Bactrim Ds [Sulfamethoxazole-Trimethoprim] Other (See Comments)    Blisters    Cephalosporins  Hives    Oseltamivir Phosphate Swelling   Amoxicillin Hives and Rash    Has patient had a PCN reaction causing immediate rash, facial/tongue/throat swelling, SOB or lightheadedness with hypotension: Yes Has patient had a PCN reaction causing severe rash involving mucus membranes or skin necrosis: Yes Has patient had a PCN reaction that required hospitalization No Has patient had a PCN reaction occurring within the last 10 years: Yes If all of the above answers are "NO", then may proceed with  Cephalosporin use.    Fluconazole Rash    Added blisters     Current Outpatient Medications  Medication Sig Dispense Refill   acetaminophen (TYLENOL) 325 MG tablet Take 325 mg by mouth every 4 (four) hours as needed for fever.     esomeprazole (NEXIUM) 40 MG capsule Take 1 capsule (40 mg total) by mouth daily at 12 noon. 90 capsule 1   potassium chloride SA (KLOR-CON M) 20 MEQ tablet Take 1 tablet (20 mEq total) by mouth 2 (two) times daily. 180 tablet 0   triamcinolone cream (KENALOG) 0.5 % Apply 1 Application topically 2 (two) times daily. 30 g 1   No current facility-administered medications for this visit.    Review of Systems  Constitutional: negative Eyes: negative Ears, nose, mouth, throat, and face: negative Respiratory: negative Cardiovascular: negative Gastrointestinal: negative Genitourinary:negative Integument/breast: negative Hematologic/lymphatic: negative Musculoskeletal:negative Neurological: negative Behavioral/Psych: negative Endocrine: negative Allergic/Immunologic: negative  Physical Exam  ZOX:WRUEA, healthy, no distress, well nourished, well developed, and anxious SKIN: skin color, texture, turgor are normal, no rashes or significant lesions HEAD: Normocephalic, No masses, lesions, tenderness or abnormalities EYES: normal, PERRLA, Conjunctiva are pink and non-injected EARS: External ears normal, Canals clear OROPHARYNX:no exudate, no erythema, and lips, buccal mucosa, and tongue normal  NECK: supple, no adenopathy, no JVD LYMPH:  no palpable lymphadenopathy, no hepatosplenomegaly BREAST:not examined LUNGS: clear to auscultation , and palpation HEART: regular rate & rhythm, no murmurs, and no gallops ABDOMEN:abdomen soft, non-tender, normal bowel sounds, and no masses or organomegaly BACK: Back symmetric, no curvature., No CVA tenderness EXTREMITIES:no joint deformities, effusion, or inflammation, no edema  NEURO: alert & oriented x 3 with fluent  speech, no focal motor/sensory deficits  PERFORMANCE STATUS: ECOG 0  LABORATORY DATA: Lab Results  Component Value Date   WBC 6.9 11/30/2022   HGB 13.1 11/30/2022   HCT 39.3 11/30/2022   MCV 89.7 11/30/2022   PLT 392 11/30/2022      Chemistry      Component Value Date/Time   NA 139 11/30/2022 1117   K 3.8 11/30/2022 1117   CL 104 11/30/2022 1117   CO2 28 11/30/2022 1117   BUN 7 11/30/2022 1117   CREATININE 0.73 11/30/2022 1117      Component Value Date/Time   CALCIUM 9.3 11/30/2022 1117   ALKPHOS 96 11/30/2022 1117   AST 11 (L) 11/30/2022 1117   ALT 8 11/30/2022 1117   BILITOT 0.7 11/30/2022 1117       RADIOGRAPHIC STUDIES: DG Chest 2 View  Result Date: 11/21/2022 CLINICAL DATA:  Cough EXAM: CHEST - 2 VIEW COMPARISON:  None Available. FINDINGS: The heart size and mediastinal contours are within normal limits. Both lungs are clear. The visualized skeletal structures are unremarkable. IMPRESSION: No active cardiopulmonary disease. Electronically Signed   By: Helyn Numbers M.D.   On: 11/21/2022 17:43    ASSESSMENT AND PLAN: This is a very pleasant 52 years old African-American female presented for evaluation of mild thrombocytosis likely reactive in nature.  Repeat CBC today showed normal platelets count of 392 with normal total white blood count, hemoglobin and hematocrit.  She also has normal LDH and comprehensive metabolic panel.    Elevated Platelet Count Chronic mild thrombocytosis, likely reactive secondary to iron deficiency. No symptoms of essential thrombocytopenia. -Prescribe iron supplement (Integra plus or Fusion plus) to be taken every other day with food and a sip of orange juice or vitamin C. -Follow-up in three months to reassess platelet count.  Vitamin B12 Deficiency Managed with monthly B12 injections. -Continue current management.  Menstruation Regular monthly periods with some clotting. -No change in management.  Bruising Unexplained  bruising on legs and arms. -Monitor and report any changes or concerns.  Tingling in Fingers and Feet Likely related to B12 deficiency. -Continue current management.  Constipation History of constipation, previously on Wegovy. -No change in management.  Pending CT Scan Results Patient reports feeling like something is in her throat, results of CT scan not yet available. -Follow-up on results when available.   The patient was advised to call immediately if she has any other concerning symptoms in the interval. The patient voices understanding of current disease status and treatment options and is in agreement with the current care plan.  All questions were answered. The patient knows to call the clinic with any problems, questions or concerns. We can certainly see the patient much sooner if necessary.  Thank you so much for allowing me to participate in the care of Rhonda [name]. I will continue to follow up the patient with you and assist in her care.  The total time spent in the appointment was 60 minutes.  Disclaimer: This note was dictated with voice recognition software. Similar sounding words can inadvertently be transcribed and may not be corrected upon review.   Lajuana Matte November 30, 2022, 12:14 PM

## 2022-12-19 ENCOUNTER — Encounter: Payer: Self-pay | Admitting: Internal Medicine

## 2022-12-19 DIAGNOSIS — Z713 Dietary counseling and surveillance: Secondary | ICD-10-CM | POA: Diagnosis not present

## 2022-12-19 DIAGNOSIS — Z6829 Body mass index (BMI) 29.0-29.9, adult: Secondary | ICD-10-CM | POA: Diagnosis not present

## 2022-12-26 ENCOUNTER — Ambulatory Visit (INDEPENDENT_AMBULATORY_CARE_PROVIDER_SITE_OTHER): Payer: Federal, State, Local not specified - PPO

## 2022-12-26 DIAGNOSIS — E538 Deficiency of other specified B group vitamins: Secondary | ICD-10-CM

## 2022-12-26 MED ORDER — CYANOCOBALAMIN 1000 MCG/ML IJ SOLN
1000.0000 ug | Freq: Once | INTRAMUSCULAR | Status: AC
  Administered 2022-12-26: 1000 ug via INTRAMUSCULAR

## 2022-12-26 NOTE — Progress Notes (Signed)
PT visits today for their b-12 injection. PT informed of what they had received and tolerated injection well. PT informed to reach out to office if needed.

## 2023-01-02 DIAGNOSIS — Z1231 Encounter for screening mammogram for malignant neoplasm of breast: Secondary | ICD-10-CM | POA: Diagnosis not present

## 2023-01-02 DIAGNOSIS — Z01419 Encounter for gynecological examination (general) (routine) without abnormal findings: Secondary | ICD-10-CM | POA: Diagnosis not present

## 2023-01-02 DIAGNOSIS — N951 Menopausal and female climacteric states: Secondary | ICD-10-CM | POA: Diagnosis not present

## 2023-01-05 LAB — HM MAMMOGRAPHY: HM Mammogram: NORMAL (ref 0–4)

## 2023-01-20 ENCOUNTER — Telehealth: Payer: Self-pay | Admitting: Pharmacy Technician

## 2023-01-20 ENCOUNTER — Other Ambulatory Visit (HOSPITAL_COMMUNITY): Payer: Self-pay

## 2023-01-20 NOTE — Telephone Encounter (Signed)
Pharmacy Patient Advocate Encounter   Received notification from CoverMyMeds that prior authorization for Esomeprazole Magnesium 40MG  dr capsules is required/requested.   Insurance verification completed.   The patient is insured through [facility] .   Per test claim: PA required and submitted KEY/EOC/Request #: [id number] APPROVED from 01/20/23 to 01/20/2024. Ran test claim, Copay is $14.17. This test claim was processed through [facility]- copay amounts may vary at other pharmacies due to pharmacy/plan contracts, or as the patient moves through the different stages of their insurance plan.

## 2023-01-20 NOTE — Telephone Encounter (Signed)
Patient has been made aware that her medication has been approved.

## 2023-01-24 ENCOUNTER — Other Ambulatory Visit (HOSPITAL_BASED_OUTPATIENT_CLINIC_OR_DEPARTMENT_OTHER): Payer: Self-pay

## 2023-01-30 ENCOUNTER — Ambulatory Visit (INDEPENDENT_AMBULATORY_CARE_PROVIDER_SITE_OTHER): Payer: Federal, State, Local not specified - PPO

## 2023-01-30 DIAGNOSIS — E538 Deficiency of other specified B group vitamins: Secondary | ICD-10-CM

## 2023-01-30 MED ORDER — CYANOCOBALAMIN 1000 MCG/ML IJ SOLN
1000.0000 ug | Freq: Once | INTRAMUSCULAR | Status: AC
  Administered 2023-01-30: 1000 ug via INTRAMUSCULAR

## 2023-01-30 NOTE — Progress Notes (Signed)
 Patient visits today for their b-12 injection. Patient informed of what they had received and tolerated injection well. Patient notified to reach out to office if needed.

## 2023-02-02 ENCOUNTER — Ambulatory Visit: Payer: Federal, State, Local not specified - PPO | Admitting: Physician Assistant

## 2023-02-02 ENCOUNTER — Other Ambulatory Visit (HOSPITAL_BASED_OUTPATIENT_CLINIC_OR_DEPARTMENT_OTHER): Payer: Self-pay

## 2023-02-02 ENCOUNTER — Encounter: Payer: Self-pay | Admitting: Physician Assistant

## 2023-02-02 ENCOUNTER — Other Ambulatory Visit (INDEPENDENT_AMBULATORY_CARE_PROVIDER_SITE_OTHER): Payer: Self-pay

## 2023-02-02 DIAGNOSIS — M5442 Lumbago with sciatica, left side: Secondary | ICD-10-CM

## 2023-02-02 DIAGNOSIS — M5441 Lumbago with sciatica, right side: Secondary | ICD-10-CM

## 2023-02-02 DIAGNOSIS — M545 Low back pain, unspecified: Secondary | ICD-10-CM | POA: Insufficient documentation

## 2023-02-02 MED ORDER — METHYLPREDNISOLONE 4 MG PO TBPK
ORAL_TABLET | ORAL | 0 refills | Status: DC
  Filled 2023-02-02: qty 21, 6d supply, fill #0

## 2023-02-02 NOTE — Progress Notes (Signed)
 Office Visit Note   Patient: Rhonda [name]           Date of Birth: 09-21-1970           MRN: [id number] Visit Date: 02/02/2023              Requested by: Joshua Debby CROME, MD [address] [place],  [place] [address] PCP: Joshua Debby CROME, MD   Assessment & Plan: Visit Diagnoses:  1. Acute midline low back pain with bilateral sciatica     Plan: Patient is a pleasant 53 year old woman with a chief complaint of low back pain radiating to her left greater than right hip.  Pain goes down as far as her knees.  She denies any weakness any loss of bowel or bladder control or paresthesias.  She had similar findings and an MRI done approximately 5 years ago.  It did show some nerve impingement more on the left than the right.  We talked about options today.  She would like to try a steroid Dosepak she knows not to take Naprosyn or other anti-inflammatories with it to take it with food.  I also gave her some home exercises.  She can follow-up with me in 1 month  Follow-Up Instructions: No follow-ups on file.   Orders:  Orders Placed This Encounter  Procedures   XR Lumbar Spine 2-3 Views   No orders of the defined types were placed in this encounter.     Procedures: No procedures performed   Clinical Data: No additional findings.   Subjective: Chief Complaint  Patient presents with   Lower Back - Pain    HPI Rhonda [name] is a pleasant 53 year old woman who comes in today with a chief complaint of low back pain radiating into her hips and buttocks.  It radiates down her thighs but no further.  Denies any paresthesias.  This been going on for about 2 weeks.  She says sitting stabbing pain and getting up difficult.  Left side is worse than the right she currently is taking Tylenol   Review of Systems  All other systems reviewed and are negative.    Objective: Vital Signs: There were no vitals taken for this visit.  Physical Exam Constitutional:      Appearance: Normal  appearance.  Pulmonary:     Effort: Pulmonary effort is normal.  Skin:    General: Skin is warm and dry.  Neurological:     General: No focal deficit present.     Mental Status: She is alert.  Psychiatric:        Mood and Affect: Mood normal.        Behavior: Behavior normal.     Ortho Exam Examination of her low back she is neurovascularly intact she does have general tenderness over the lower low back she has 5 out of 5 strength bilaterally with extension and flexion of her knees and ankles negative straight leg raise bilaterally.  No pain with manipulation of her hip no deformities palpated in lower spine Specialty Comments:  No specialty comments available.  Imaging: XR Lumbar Spine 2-3 Views Result Date: 02/02/2023 Radiographic images of her lumbar spine were reviewed today.  Well-maintained alignment she does have some facet arthropathy and degenerative changes especially at L4-5 L5-S1    PMFS History: Patient Active Problem List   Diagnosis Date Noted   Low back pain 02/02/2023   Flu vaccine need 11/14/2022   Thrombocytosis 11/14/2022   Anterior neck pain 11/14/2022   Chronic  hypokalemia 11/14/2022   Subacute cough 11/14/2022   Obesity (BMI 35.0-39.9 without comorbidity) 11/01/2021   Vitamin D  deficiency 11/01/2021   Eczema 07/20/2021   Primary osteoarthritis of both knees 02/08/2021   Screen for colon cancer 07/30/2019   Psychophysiological insomnia 07/30/2019   Chiari malformation type II (HCC) 09/27/2016   Abnormal MRI of head 03/07/2016   Gastroesophageal reflux disease without esophagitis 08/19/2015   IBS (irritable bowel syndrome) 08/19/2015   Routine general medical examination at a health care facility 07/14/2014   Vitamin B 12 deficiency 12/08/2008   CARPAL TUNNEL SYNDROME 05/14/2007   DE QUERVAIN'S TENOSYNOVITIS 05/14/2007   Allergic rhinitis 01/31/2007   Past Medical History:  Diagnosis Date   Allergic rhinitis    Allergy    B12 deficiency     Carpal tunnel syndrome    Dysfunction of eustachian tube     Family History  Problem Relation Age of Onset   Heart disease Father    Hypertension Father    Diabetes Father    Hyperlipidemia Mother    Hypertension Mother    Heart disease Maternal Grandmother    Diabetes Brother    Headache Neg Hx    Colon cancer Neg Hx    Prostate cancer Neg Hx    Rectal cancer Neg Hx    Stomach cancer Neg Hx    Esophageal cancer Neg Hx     Past Surgical History:  Procedure Laterality Date   BUNIONECTOMY     right    CHOLECYSTECTOMY N/A 09/25/2015   Procedure: LAPAROSCOPIC CHOLECYSTECTOMY WITH INTRAOPERATIVE CHOLANGIOGRAM;  Surgeon: Alm Angle, MD;  Location: [facility];  Service: General;  Laterality: N/A;   TONSILLECTOMY     Social History   Occupational History   Occupation: [employer]er  Tobacco Use   Smoking status: Never   Smokeless tobacco: Never  Vaping Use   Vaping status: Never Used  Substance and Sexual Activity   Alcohol use: No   Drug use: No   Sexual activity: Not on file

## 2023-02-02 NOTE — Addendum Note (Signed)
 Addended by: Polly Cobia on: 02/02/2023 10:26 AM   Modules accepted: Orders

## 2023-02-07 ENCOUNTER — Telehealth: Payer: Self-pay | Admitting: Internal Medicine

## 2023-02-07 NOTE — Telephone Encounter (Signed)
 Copied from CRM [id number]. Topic: Referral - Request for Referral >> Feb 07, 2023  9:29 AM Rhonda [name] wrote: Reason for CRM: Requesting a referral for ear nose and throat specialist   Did the patient discuss referral with their provider in the last year? Yes (If No - schedule appointment) (If Yes - send message)  Appointment offered? Dr. Joshua wanted to wait for test results to come back per patient.  Type of order/referral and detailed reason for visit: Pain in ear that patient is worried could be causing an infection  Preference of office, provider, location: No  If referral order, have you been seen by this specialty before? Yes but doctor no  longer in practice  (If Yes, this issue or another issue? When? Where?  Can we respond through MyChart? Yes

## 2023-02-09 ENCOUNTER — Ambulatory Visit: Payer: Federal, State, Local not specified - PPO | Admitting: Primary Care

## 2023-02-09 NOTE — Telephone Encounter (Signed)
Patient needs an OV, sent mychart message

## 2023-02-14 DIAGNOSIS — M722 Plantar fascial fibromatosis: Secondary | ICD-10-CM | POA: Diagnosis not present

## 2023-02-14 DIAGNOSIS — M21961 Unspecified acquired deformity of right lower leg: Secondary | ICD-10-CM | POA: Diagnosis not present

## 2023-02-14 DIAGNOSIS — M2042 Other hammer toe(s) (acquired), left foot: Secondary | ICD-10-CM | POA: Diagnosis not present

## 2023-02-20 ENCOUNTER — Encounter: Payer: Self-pay | Admitting: Internal Medicine

## 2023-02-20 ENCOUNTER — Ambulatory Visit: Payer: Federal, State, Local not specified - PPO | Admitting: Internal Medicine

## 2023-02-20 VITALS — BP 114/80 | HR 83 | Temp 98.4°F | Ht 65.0 in | Wt 195.0 lb

## 2023-02-20 DIAGNOSIS — J069 Acute upper respiratory infection, unspecified: Secondary | ICD-10-CM | POA: Diagnosis not present

## 2023-02-20 DIAGNOSIS — G8929 Other chronic pain: Secondary | ICD-10-CM | POA: Diagnosis not present

## 2023-02-20 DIAGNOSIS — H9201 Otalgia, right ear: Secondary | ICD-10-CM

## 2023-02-20 LAB — POC COVID19 BINAXNOW: SARS Coronavirus 2 Ag: NEGATIVE

## 2023-02-20 LAB — POCT INFLUENZA A/B
Influenza A, POC: NEGATIVE
Influenza B, POC: NEGATIVE

## 2023-02-20 MED ORDER — NEOMYCIN-POLYMYXIN-HC 3.5-10000-1 OT SUSP: 3.0000 [drp] | Freq: Three times a day (TID) | OTIC | 0 refills | Status: DC

## 2023-02-20 MED ORDER — FLUTICASONE PROPIONATE 50 MCG/ACT NA SUSP: 2.0000 | Freq: Every day | NASAL | 6 refills | Status: DC

## 2023-02-20 MED ORDER — PROMETHAZINE-DM 6.25-15 MG/5ML PO SYRP: 5.0000 mL | ORAL_SOLUTION | Freq: Four times a day (QID) | ORAL | 0 refills | Status: DC | PRN

## 2023-02-20 NOTE — Assessment & Plan Note (Signed)
Flu and covid-19 testing done and both are negative. Still suspicion for viral etiology for 3 days of symptoms. Rx promethazine/dm cough syrup and flonase for congestion and drainage. She was having green drainage dark green and was concerned. Reviewed medical literature that the color of the drainage is not consistent with whether this is infection with viral or bacterial and she is most likely to have a virus and does not need antibiotics. She is able to accept this but still has serious concerns about the color due to past experience.

## 2023-02-20 NOTE — Assessment & Plan Note (Signed)
This appears to be a separate but worse problem lately. She had CT neck done in the fall which was not looking at the ear region although the patient thought this was why this was done. She needs referral to ENT and is having bad pain for months/years with worsening with exposure to any cold or airflow.

## 2023-02-20 NOTE — Progress Notes (Signed)
   Subjective:   Patient ID: Rhonda [name], female    DOB: Mar 28, 1970, 54 y.o.   MRN: [id number]  Cough Associated symptoms include chills, ear pain, myalgias, postnasal drip, rhinorrhea and a sore throat. Pertinent negatives include no fever, shortness of breath or wheezing.   The patient is a 53 YO female coming in for 3 days of cold/congestion symptoms with right ear pain. At end of visit she was not feeling heard about her symptoms and the length. She then mentions the chronic right ear symptoms which have been ongoing and worsening over years and she does not feel an adequate plan is in place. She does not have a current ENT although one in the past told her she would need ear tubes. She is having less hearing in that ear.   Review of Systems  Constitutional:  Positive for activity change, appetite change and chills. Negative for fatigue, fever and unexpected weight change.  HENT:  Positive for congestion, ear pain, hearing loss, postnasal drip, rhinorrhea, sinus pressure and sore throat. Negative for ear discharge, sinus pain, sneezing, tinnitus, trouble swallowing and voice change.   Eyes: Negative.   Respiratory:  Positive for cough. Negative for chest tightness, shortness of breath and wheezing.   Cardiovascular: Negative.   Gastrointestinal: Negative.   Musculoskeletal:  Positive for myalgias.  Neurological: Negative.     Objective:  Physical Exam Constitutional:      Appearance: She is well-developed.  HENT:     Head: Normocephalic and atraumatic.     Comments: Oropharynx with redness and clear drainage, nose with swollen turbinates, TMs normal bilaterally.     Right Ear: Tympanic membrane normal.     Left Ear: Tympanic membrane normal.     Ears:     Comments: Right and left TM normal with redness in both ear canals right worse than left Neck:     Thyroid: No thyromegaly.     Comments: Shoddy lymphadenopathy right neck with mild tenderness Cardiovascular:     Rate and  Rhythm: Normal rate and regular rhythm.  Pulmonary:     Effort: Pulmonary effort is normal. No respiratory distress.     Breath sounds: Normal breath sounds. No wheezing or rales.  Abdominal:     Palpations: Abdomen is soft.  Musculoskeletal:        General: Tenderness present.     Cervical back: Normal range of motion.  Lymphadenopathy:     Cervical: Cervical adenopathy present.  Skin:    General: Skin is warm and dry.  Neurological:     Mental Status: She is alert and oriented to person, place, and time.     Vitals:   02/20/23 1019  BP: 114/80  Pulse: 83  Temp: 98.4 F (36.9 C)  TempSrc: Oral  SpO2: 97%  Weight: 195 lb (88.5 kg)  Height: 5\' 5"  (1.651 m)    Assessment & Plan:  Visit time 25 minutes in face to face communication with patient and coordination of care, additional 7 minutes spent in record review, coordination or care, ordering tests, communicating/referring to other healthcare professionals, documenting in medical records all on the same day of the visit for total time 32 minutes spent on the visit.

## 2023-02-20 NOTE — Patient Instructions (Addendum)
We have sent in ear drops to use 3 drops 3 times a day for 5 days for the ears.   We have sent in cough medicine liquid to use up to 3 times a day.  We have sent in flonase to use 2 sprays in each nostril daily to help decrease drainage for next 1-2 weeks.

## 2023-02-21 ENCOUNTER — Ambulatory Visit: Payer: Self-pay | Admitting: Internal Medicine

## 2023-02-21 ENCOUNTER — Ambulatory Visit: Payer: Federal, State, Local not specified - PPO | Admitting: Family Medicine

## 2023-02-21 ENCOUNTER — Encounter: Payer: Self-pay | Admitting: Family Medicine

## 2023-02-21 VITALS — BP 116/70 | HR 99 | Temp 98.4°F | Ht 65.0 in | Wt 197.2 lb

## 2023-02-21 DIAGNOSIS — H65193 Other acute nonsuppurative otitis media, bilateral: Secondary | ICD-10-CM | POA: Diagnosis not present

## 2023-02-21 MED ORDER — AZITHROMYCIN 250 MG PO TABS: ORAL_TABLET | ORAL | 0 refills | Status: AC

## 2023-02-21 NOTE — Telephone Encounter (Signed)
  Chief Complaint: worsening cough Symptoms: cough, chills, hoarseness Frequency: 3+ days ago Pertinent Negatives: Patient denies chest pain, wheezing Disposition: [] ED /[] Urgent Care (no appt availability in office) / [x] Appointment(In office/virtual)/ []  [facility]/ [] Home Care/ [] Refused Recommended Disposition /[] [facility]/ []  Follow-up with PCP Additional Notes: Patient c/o worsening cough. Seen at PCP yesterday and was prescribed cough medication with no relief. Reports symptoms are worsening and reporting voice hoarseness despite rx from yesterday and humidification. She did endorse chills this morning, but did not check temperature.  Scheduled patient per protocol on 02/21/2023. Patient verbalized understanding and to call back with worsening symptoms.    Copied from CRM [id number]. Topic: Clinical - Red Word Triage >> Feb 21, 2023 12:03 PM Florestine Avers wrote: Red Word that prompted transfer to Nurse Triage: Patient called in stating that she came in he office yesterday, she said she is feeling worse than she did yesterday. Patient is requesting a prescription for possible infection. Patient states that she did inform the nurse yesterday that it was an infection and she refused to give her medication. Patient symptoms include, chronic cough, mucus, patient can barely speak, hoarse voice, lymph nodes under neck swollen. Patient is extremely upset. Reason for Disposition  [1] Continuous (nonstop) coughing interferes with work or school AND [2] no improvement using cough treatment per Care Advice  Answer Assessment - Initial Assessment Questions 1. ONSET: "When did the cough begin?"      3+ days ago 2. SEVERITY: "How bad is the cough today?"      bad 3. SPUTUM: "Describe the color of your sputum" (none, dry cough; clear, white, yellow, green)     green 4. HEMOPTYSIS: "Are you coughing up any blood?" If so ask: "How much?" (flecks, streaks, tablespoons, etc.)     No,  but some out of my nose when I blow it 5. DIFFICULTY BREATHING: "Are you having difficulty breathing?" If Yes, ask: "How bad is it?" (e.g., mild, moderate, severe)    - MILD: No SOB at rest, mild SOB with walking, speaks normally in sentences, can lie down, no retractions, pulse < 100.    - MODERATE: SOB at rest, SOB with minimal exertion and prefers to sit, cannot lie down flat, speaks in phrases, mild retractions, audible wheezing, pulse 100-120.    - SEVERE: Very SOB at rest, speaks in single words, struggling to breathe, sitting hunched forward, retractions, pulse > 120      Moderate - patient is speaking in phrases to triager d/t coughing spells but denies wheezing/tachycardia 6. FEVER: "Do you have a fever?" If Yes, ask: "What is your temperature, how was it measured, and when did it start?"     This morning when I woke up - reports she was sweating so assumed she had a fever 7. CARDIAC HISTORY: "Do you have any history of heart disease?" (e.g., heart attack, congestive heart failure)      no 8. LUNG HISTORY: "Do you have any history of lung disease?"  (e.g., pulmonary embolus, asthma, emphysema)     no 9. PE RISK FACTORS: "Do you have a history of blood clots?" (or: recent major surgery, recent prolonged travel, bedridden)     no 10. OTHER SYMPTOMS: "Do you have any other symptoms?" (e.g., runny nose, wheezing, chest pain)       Runny nose Denies wheezing, chest pain  Protocols used: Cough - Acute Productive-A-AH

## 2023-02-21 NOTE — Progress Notes (Unsigned)
   Acute Office Visit  Subjective:     Patient ID: Rhonda [name], female    DOB: 20-Apr-1970, 53 y.o.   MRN: [id number]  No chief complaint on file.   HPI Patient is in today for evaluation of ***, for the last ***. Has tried Denies known sick contacts, Denies abdominal pain, nausea, vomiting, diarrhea, rash, fever, chills, other symptoms.  Medical hx as outlined below.  ROS Per HPI      Objective:    There were no vitals taken for this visit.   Physical Exam Vitals and nursing note reviewed.  Constitutional:      General: She is not in acute distress.    Appearance: She is ill-appearing.  HENT:     Head: Normocephalic and atraumatic.     Right Ear: Swelling present. A middle ear effusion is present. Tympanic membrane is erythematous and bulging.     Left Ear: Swelling present. A middle ear effusion is present. Tympanic membrane is erythematous and bulging.     Nose: No congestion.     Mouth/Throat:     Mouth: Mucous membranes are moist.     Pharynx: Oropharynx is clear. No oropharyngeal exudate or posterior oropharyngeal erythema.  Eyes:     Extraocular Movements: Extraocular movements intact.  Cardiovascular:     Rate and Rhythm: Normal rate and regular rhythm.  Pulmonary:     Effort: Pulmonary effort is normal. No respiratory distress.     Breath sounds: No wheezing, rhonchi or rales.  Musculoskeletal:        General: Normal range of motion.     Cervical back: Normal range of motion and neck supple.  Lymphadenopathy:     Cervical: Cervical adenopathy present.  Skin:    General: Skin is warm and dry.     Capillary Refill: Capillary refill takes less than 2 seconds.  Neurological:     General: No focal deficit present.     Mental Status: She is alert and oriented to person, place, and time.    No results found for any visits on 02/21/23.      Assessment & Plan:  ***  No orders of the defined types were placed in this encounter.   No follow-ups on  file.  Moshe Cipro, FNP

## 2023-02-21 NOTE — Patient Instructions (Addendum)
You have a double ear infection.   I have sent in azithromycin for you to take. Take 2 tablets today, then 1 tablet daily for the next 4 days.  Follow-up with me for new or worsening symptoms.

## 2023-02-25 ENCOUNTER — Other Ambulatory Visit: Payer: Self-pay | Admitting: Internal Medicine

## 2023-02-25 DIAGNOSIS — E876 Hypokalemia: Secondary | ICD-10-CM

## 2023-03-01 ENCOUNTER — Telehealth: Payer: Self-pay | Admitting: Internal Medicine

## 2023-03-01 ENCOUNTER — Inpatient Hospital Stay: Payer: Federal, State, Local not specified - PPO | Attending: Internal Medicine | Admitting: Internal Medicine

## 2023-03-01 ENCOUNTER — Inpatient Hospital Stay: Payer: Federal, State, Local not specified - PPO | Admitting: Internal Medicine

## 2023-03-01 VITALS — BP 123/73 | HR 88 | Temp 97.8°F | Resp 16 | Ht 65.0 in | Wt 198.4 lb

## 2023-03-01 DIAGNOSIS — E611 Iron deficiency: Secondary | ICD-10-CM | POA: Insufficient documentation

## 2023-03-01 DIAGNOSIS — D75839 Thrombocytosis, unspecified: Secondary | ICD-10-CM | POA: Insufficient documentation

## 2023-03-01 DIAGNOSIS — D473 Essential (hemorrhagic) thrombocythemia: Secondary | ICD-10-CM

## 2023-03-01 DIAGNOSIS — Z79899 Other long term (current) drug therapy: Secondary | ICD-10-CM | POA: Insufficient documentation

## 2023-03-01 LAB — CBC WITH DIFFERENTIAL (CANCER CENTER ONLY)
Abs Immature Granulocytes: 0.02 10*3/uL (ref 0.00–0.07)
Basophils Absolute: 0 10*3/uL (ref 0.0–0.1)
Basophils Relative: 0 %
Eosinophils Absolute: 0 10*3/uL (ref 0.0–0.5)
Eosinophils Relative: 1 %
HCT: 41 % (ref 36.0–46.0)
Hemoglobin: 13.8 g/dL (ref 12.0–15.0)
Immature Granulocytes: 0 %
Lymphocytes Relative: 26 %
Lymphs Abs: 1.5 10*3/uL (ref 0.7–4.0)
MCH: 30.7 pg (ref 26.0–34.0)
MCHC: 33.7 g/dL (ref 30.0–36.0)
MCV: 91.3 fL (ref 80.0–100.0)
Monocytes Absolute: 0.4 10*3/uL (ref 0.1–1.0)
Monocytes Relative: 6 %
Neutro Abs: 3.6 10*3/uL (ref 1.7–7.7)
Neutrophils Relative %: 67 %
Platelet Count: 410 10*3/uL — ABNORMAL HIGH (ref 150–400)
RBC: 4.49 MIL/uL (ref 3.87–5.11)
RDW: 13.9 % (ref 11.5–15.5)
WBC Count: 5.5 10*3/uL (ref 4.0–10.5)
nRBC: 0 % (ref 0.0–0.2)

## 2023-03-01 LAB — IRON AND IRON BINDING CAPACITY (CC-WL,HP ONLY)
Iron: 71 ug/dL (ref 28–170)
Saturation Ratios: 20 % (ref 10.4–31.8)
TIBC: 354 ug/dL (ref 250–450)
UIBC: 283 ug/dL (ref 148–442)

## 2023-03-01 LAB — CMP (CANCER CENTER ONLY)
ALT: 10 U/L (ref 0–44)
AST: 14 U/L — ABNORMAL LOW (ref 15–41)
Albumin: 3.9 g/dL (ref 3.5–5.0)
Alkaline Phosphatase: 93 U/L (ref 38–126)
Anion gap: 8 (ref 5–15)
BUN: 6 mg/dL (ref 6–20)
CO2: 29 mmol/L (ref 22–32)
Calcium: 9.4 mg/dL (ref 8.9–10.3)
Chloride: 101 mmol/L (ref 98–111)
Creatinine: 0.78 mg/dL (ref 0.44–1.00)
GFR, Estimated: >60 mL/min (ref >60)
Glucose, Bld: 115 mg/dL — ABNORMAL HIGH (ref 70–99)
Potassium: 3.7 mmol/L (ref 3.5–5.1)
Sodium: 138 mmol/L (ref 135–145)
Total Bilirubin: 0.9 mg/dL (ref 0.0–1.2)
Total Protein: 7.3 g/dL (ref 6.5–8.1)

## 2023-03-01 LAB — FOLATE: Folate: 21.8 ng/mL (ref >5.9)

## 2023-03-01 LAB — VITAMIN B12: Vitamin B-12: 290 pg/mL (ref 180–914)

## 2023-03-01 LAB — FERRITIN: Ferritin: 34 ng/mL (ref 11–307)

## 2023-03-01 LAB — LACTATE DEHYDROGENASE: LDH: 134 U/L (ref 98–192)

## 2023-03-01 NOTE — Telephone Encounter (Signed)
 Copied from CRM [id number]. Topic: General - Other >> Mar 01, 2023  3:04 PM Robinson H wrote: Reason for CRM: Patient following up on previous message from yesterday regarding stronger antibiotics for patient, please reach out to patient, thanks.   [place] [phone]

## 2023-03-01 NOTE — Progress Notes (Signed)
 [facility] [phone]   [phone]  OFFICE PROGRESS NOTE  Joshua Debby CROME, MD [address] [place] [address]  DIAGNOSIS: Thrombocytosis likely reactive in nature secondary to iron deficiency.  Essential thrombocythemia could not be ruled out at this point.  PRIOR THERAPY: None  CURRENT THERAPY: Integra +1 capsule p.o. daily  INTERVAL HISTORY: Rhonda [name] 53 y.o. female returns to the clinic today for follow-up visit.Discussed the use of AI scribe software for clinical note transcription with the patient, who gave verbal consent to proceed.  History of Present Illness   Rhonda [name] is a 53 year old female who presents with elevated platelet count.  Her platelet count this morning is 410, slightly above the normal range, with a previous count of 392 in November. No symptoms such as nausea, vomiting, diarrhea, bleeding, or bruising. She inquires if her elevated platelet count could be causing her to feel cold, but her hemoglobin is normal, and she does not have anemia. She has not used any creams containing hydrocortisone, such as Kenalog , which could potentially elevate platelet levels.  She was started on Integra Plus  a few months ago due to concerns about iron deficiency and has been taking it every other day as instructed. She ran out of the supplement before this visit and decided to wait for further instructions. No issues with constipation, which she typically experiences with other iron supplements.  Her menstrual cycles have ceased since November. She expresses concern about her platelet count always seeming elevated, although it fluctuates slightly.  She works for the [employer] and is currently experiencing stress due to changes at work.       MEDICAL HISTORY: Past Medical History:  Diagnosis Date   Allergic rhinitis    Allergy    B12 deficiency    Carpal tunnel syndrome    Dysfunction of eustachian tube      ALLERGIES:  is allergic to bactrim  ds [sulfamethoxazole -trimethoprim ], cephalosporins, oseltamivir phosphate, amoxicillin , and fluconazole .  MEDICATIONS:  Current Outpatient Medications  Medication Sig Dispense Refill   acetaminophen  (TYLENOL ) 325 MG tablet Take 325 mg by mouth every 4 (four) hours as needed for fever.     esomeprazole  (NEXIUM ) 40 MG capsule Take 1 capsule (40 mg total) by mouth daily at 12 noon. 90 capsule 1   FeFum-FePoly-FA-B Cmp-C-Biot (INTEGRA PLUS ) CAPS 1 capsule p.o. every other day with vitamin C or orange juice 30 capsule 3   fluticasone  (FLONASE ) 50 MCG/ACT nasal spray Place 2 sprays into both nostrils daily. 16 g 6   neomycin -polymyxin-hydrocortisone (CORTISPORIN) 3.5-10000-1 OTIC suspension Place 3 drops into both ears 3 (three) times daily. 10 mL 0   potassium chloride  SA (KLOR-CON  M) 20 MEQ tablet TAKE 1 TABLET(20 MEQ) BY MOUTH TWICE DAILY 180 tablet 0   promethazine -dextromethorphan (PROMETHAZINE -DM) 6.25-15 MG/5ML syrup Take 5 mLs by mouth 4 (four) times daily as needed. 118 mL 0   triamcinolone  cream (KENALOG ) 0.5 % Apply 1 Application topically 2 (two) times daily. 30 g 1   No current facility-administered medications for this visit.    SURGICAL HISTORY:  Past Surgical History:  Procedure Laterality Date   BUNIONECTOMY     right    CHOLECYSTECTOMY N/A 09/25/2015   Procedure: LAPAROSCOPIC CHOLECYSTECTOMY WITH INTRAOPERATIVE CHOLANGIOGRAM;  Surgeon: Alm Angle, MD;  Location: [facility];  Service: General;  Laterality: N/A;   TONSILLECTOMY      REVIEW OF SYSTEMS:  A comprehensive review of systems was negative  except for: Constitutional: positive for fatigue   PHYSICAL EXAMINATION: General appearance: alert, cooperative, fatigued, and no distress Head: Normocephalic, without obvious abnormality, atraumatic Neck: no adenopathy, no JVD, supple, symmetrical, trachea midline, and thyroid  not enlarged, symmetric, no tenderness/mass/nodules Lymph nodes:  Cervical, supraclavicular, and axillary nodes normal. Resp: clear to auscultation bilaterally Back: symmetric, no curvature. ROM normal. No CVA tenderness. Cardio: regular rate and rhythm, S1, S2 normal, no murmur, click, rub or gallop GI: soft, non-tender; bowel sounds normal; no masses,  no organomegaly Extremities: extremities normal, atraumatic, no cyanosis or edema  ECOG PERFORMANCE STATUS: 1 - Symptomatic but completely ambulatory  Blood pressure 123/73, pulse 88, temperature 97.8 F (36.6 C), temperature source Temporal, resp. rate 16, height 5' 5 (1.651 m), weight 198 lb 6.4 oz (90 kg), SpO2 100%.  LABORATORY DATA: Lab Results  Component Value Date   WBC 5.5 03/01/2023   HGB 13.8 03/01/2023   HCT 41.0 03/01/2023   MCV 91.3 03/01/2023   PLT 410 (H) 03/01/2023      Chemistry      Component Value Date/Time   NA 139 11/30/2022 1117   K 3.8 11/30/2022 1117   CL 104 11/30/2022 1117   CO2 28 11/30/2022 1117   BUN 7 11/30/2022 1117   CREATININE 0.73 11/30/2022 1117      Component Value Date/Time   CALCIUM 9.3 11/30/2022 1117   ALKPHOS 96 11/30/2022 1117   AST 11 (L) 11/30/2022 1117   ALT 8 11/30/2022 1117   BILITOT 0.7 11/30/2022 1117       RADIOGRAPHIC STUDIES: XR Lumbar Spine 2-3 Views Result Date: 02/02/2023 Radiographic images of her lumbar spine were reviewed today.  Well-maintained alignment she does have some facet arthropathy and degenerative changes especially at L4-5 L5-S1   ASSESSMENT AND PLAN:    Thrombocytosis Mildly elevated platelet count at 410 (normal up to 400). Previous count in November was 392. Differential diagnosis includes essential thrombocythemia and reactive thrombocytosis. Discussed JAK2 mutation testing to rule out essential thrombocythemia, but current levels do not strongly indicate this condition. Patient expressed concern about consistently elevated platelet levels. No symptoms such as bleeding, bruising, or corticosteroid use. If  essential thrombocythemia is confirmed, treatment would involve baby aspirin and possibly hydroxyurea. - Order JAK2 mutation test - Monitor platelet count and follow up in three months  Iron Deficiency Currently taking Integra Plus  every other day for suspected iron deficiency. No adverse effects reported. Hemoglobin levels are normal, indicating no anemia. Iron studies pending to determine current iron status. If iron levels are high, supplementation may be reduced to two days a week or stopped. - Continue Integra Plus  every other day - Adjust dosage based on pending iron study results  General Health Maintenance Stopped menstruating since November, which may affect iron requirements. - Adjust iron supplementation to two days a week if iron levels are adequate  Follow-up - Follow up in three months to reassess platelet count and iron levels.   The patient was advised to call immediately if he has any concerning symptoms in the interval. The patient voices understanding of current disease status and treatment options and is in agreement with the current care plan.  All questions were answered. The patient knows to call the clinic with any problems, questions or concerns. We can certainly see the patient much sooner if necessary.  The total time spent in the appointment was 20 minutes.  Disclaimer: This note was dictated with voice recognition software. Similar sounding words can inadvertently be transcribed  and may not be corrected upon review.

## 2023-03-01 NOTE — Telephone Encounter (Signed)
 Spoke with patient, she states "I was not told a timeframe to let her know if I wasn't feeling better, I will not be coming back in to pay another $35 copay" and hung up the phone.

## 2023-03-02 ENCOUNTER — Ambulatory Visit: Payer: Federal, State, Local not specified - PPO

## 2023-03-02 DIAGNOSIS — E538 Deficiency of other specified B group vitamins: Secondary | ICD-10-CM

## 2023-03-02 MED ORDER — CYANOCOBALAMIN 1000 MCG/ML IJ SOLN
1000.0000 ug | Freq: Once | INTRAMUSCULAR | Status: AC
  Administered 2023-03-02: 1000 ug via INTRAMUSCULAR

## 2023-03-02 NOTE — Progress Notes (Signed)
 Patient visits today for their b-12 injection. Patient informed of what they had received and tolerated injection well. Patient notified to reach out to office if needed.

## 2023-03-03 ENCOUNTER — Ambulatory Visit (INDEPENDENT_AMBULATORY_CARE_PROVIDER_SITE_OTHER): Payer: Federal, State, Local not specified - PPO | Admitting: Physician Assistant

## 2023-03-03 ENCOUNTER — Encounter: Payer: Self-pay | Admitting: Physician Assistant

## 2023-03-03 DIAGNOSIS — M5440 Lumbago with sciatica, unspecified side: Secondary | ICD-10-CM

## 2023-03-03 NOTE — Progress Notes (Signed)
 Office Visit Note   Patient: Rhonda [name]           Date of Birth: 09/09/1970           MRN: [id number] Visit Date: 03/03/2023              Requested by: Joshua Debby CROME, MD [address] [place],  [place] [address] PCP: Joshua Debby CROME, MD  Chief Complaint  Patient presents with   Lower Back - Follow-up      HPI: Patient is a pleasant 53 year old woman who I saw about a month ago for left-sided back pain.  At that time we tried a steroid Dosepak and I gave her some stretching exercises.  She has been compliant with doing this and reports she is feeling a lot better.  Assessment & Plan: Visit Diagnoses: Low back pain  Plan: I discussed with her that she had something similar about 5 years ago.  She is continuing to keep her back stretched out.  If she had the return of symptoms fairly quickly I would recommend an MRI and referral to Dr. Eldonna for injections  Follow-Up Instructions: No follow-ups on file.   Ortho Exam  Patient is alert, oriented, no adenopathy, well-dressed, normal affect, normal respiratory effort. Examination of the low back she is neurovascular intact no pain to palpation negative straight leg raise good strength  Imaging: No results found. No images are attached to the encounter.  Labs: Lab Results  Component Value Date   HGBA1C 5.6 11/01/2021   HGBA1C 5.4 04/03/2020   HGBA1C 5.4 12/26/2019   ESRSEDRATE 83 (H) 08/30/2018   ESRSEDRATE 59 (H) 08/29/2018   ESRSEDRATE 36 (H) 07/05/2013   CRP 1.4 11/14/2022   CRP 6.3 (H) 08/30/2018   CRP 8.5 08/29/2018   REPTSTATUS 09/04/2018 FINAL 08/30/2018   CULT  08/30/2018    NO GROWTH 5 DAYS Performed at [facility], [address], [place], [place] [address]      Lab Results  Component Value Date   ALBUMIN 3.9 03/01/2023   ALBUMIN 4.1 11/30/2022   ALBUMIN 3.9 09/12/2022    Lab Results  Component Value Date   MG 2.0 06/11/2015   Lab Results  Component Value Date   VD25OH 25.06  (L) 11/01/2021   VD25OH 37.02 02/02/2018   VD25OH 18.55 (L) 10/26/2017    No results found for: PREALBUMIN    Latest Ref Rng & Units 03/01/2023    8:49 AM 11/30/2022   11:17 AM 11/14/2022    3:44 PM  CBC EXTENDED  WBC 4.0 - 10.5 K/uL 5.5  6.9  6.7   RBC 3.87 - 5.11 MIL/uL 4.49  4.38  4.56   Hemoglobin 12.0 - 15.0 g/dL 86.1  86.8  86.4   HCT 36.0 - 46.0 % 41.0  39.3  40.9   Platelets 150 - 400 K/uL 410  392  426.0   NEUT# 1.7 - 7.7 K/uL 3.6  4.2  4.2   Lymph# 0.7 - 4.0 K/uL 1.5  2.0  2.0      There is no height or weight on file to calculate BMI.  Orders:  No orders of the defined types were placed in this encounter.  No orders of the defined types were placed in this encounter.    Procedures: No procedures performed  Clinical Data: No additional findings.  ROS:  All other systems negative, except as noted in the HPI. Review of Systems  Objective: Vital Signs: There were  no vitals taken for this visit.  Specialty Comments:  No specialty comments available.  PMFS History: Patient Active Problem List   Diagnosis Date Noted   Chronic right ear pain 02/20/2023   Low back pain 02/02/2023   Flu vaccine need 11/14/2022   Thrombocytosis 11/14/2022   Anterior neck pain 11/14/2022   Chronic hypokalemia 11/14/2022   Subacute cough 11/14/2022   Obesity (BMI 35.0-39.9 without comorbidity) 11/01/2021   Vitamin D  deficiency 11/01/2021   Eczema 07/20/2021   Primary osteoarthritis of both knees 02/08/2021   Screen for colon cancer 07/30/2019   Psychophysiological insomnia 07/30/2019   Chiari malformation type II (HCC) 09/27/2016   Abnormal MRI of head 03/07/2016   Gastroesophageal reflux disease without esophagitis 08/19/2015   IBS (irritable bowel syndrome) 08/19/2015   Viral URI 02/25/2015   Routine general medical examination at a health care facility 07/14/2014   Vitamin B 12 deficiency 12/08/2008   CARPAL TUNNEL SYNDROME 05/14/2007   DE QUERVAIN'S  TENOSYNOVITIS 05/14/2007   Allergic rhinitis 01/31/2007   Past Medical History:  Diagnosis Date   Allergic rhinitis    Allergy    B12 deficiency    Carpal tunnel syndrome    Dysfunction of eustachian tube     Family History  Problem Relation Age of Onset   Heart disease Father    Hypertension Father    Diabetes Father    Hyperlipidemia Mother    Hypertension Mother    Heart disease Maternal Grandmother    Diabetes Brother    Headache Neg Hx    Colon cancer Neg Hx    Prostate cancer Neg Hx    Rectal cancer Neg Hx    Stomach cancer Neg Hx    Esophageal cancer Neg Hx     Past Surgical History:  Procedure Laterality Date   BUNIONECTOMY     right    CHOLECYSTECTOMY N/A 09/25/2015   Procedure: LAPAROSCOPIC CHOLECYSTECTOMY WITH INTRAOPERATIVE CHOLANGIOGRAM;  Surgeon: Alm Angle, MD;  Location: [facility];  Service: General;  Laterality: N/A;   TONSILLECTOMY     Social History   Occupational History   Occupation: [employer]er  Tobacco Use   Smoking status: Never   Smokeless tobacco: Never  Vaping Use   Vaping status: Never Used  Substance and Sexual Activity   Alcohol use: No   Drug use: No   Sexual activity: Not on file

## 2023-03-05 ENCOUNTER — Encounter: Payer: Self-pay | Admitting: Internal Medicine

## 2023-03-05 NOTE — Progress Notes (Signed)
 This encounter was created in error - please disregard.

## 2023-03-07 ENCOUNTER — Telehealth: Payer: Self-pay

## 2023-03-07 NOTE — Telephone Encounter (Signed)
Copied from CRM [id number]. Topic: Clinical - Medication Question >> Feb 28, 2023  9:59 AM Adele Barthel wrote: Reason for CRM: Patient was seen by Dr. Clent Ridges last week for otitis media and is still experiencing symptoms. Patient was advised to contact office if she was still symptomatic so that Dr. Clent Ridges could prescribed a stronger antibiotic. CB# [phone], please call into [facility] on file. >> Mar 06, 2023  1:47 PM Raven B wrote: Sent for review

## 2023-03-08 ENCOUNTER — Other Ambulatory Visit: Payer: Self-pay | Admitting: Internal Medicine

## 2023-03-08 DIAGNOSIS — H66006 Acute suppurative otitis media without spontaneous rupture of ear drum, recurrent, bilateral: Secondary | ICD-10-CM

## 2023-03-08 DIAGNOSIS — H6690 Otitis media, unspecified, unspecified ear: Secondary | ICD-10-CM | POA: Insufficient documentation

## 2023-03-08 MED ORDER — LEVOFLOXACIN 500 MG PO TABS: 500.0000 mg | ORAL_TABLET | Freq: Every day | ORAL | 0 refills | Status: AC

## 2023-03-09 LAB — NGS JAK2 E12-15/CALR/MPL

## 2023-03-20 ENCOUNTER — Ambulatory Visit: Payer: Federal, State, Local not specified - PPO | Admitting: Internal Medicine

## 2023-03-20 ENCOUNTER — Encounter: Payer: Self-pay | Admitting: Internal Medicine

## 2023-03-20 VITALS — BP 122/66 | HR 81 | Temp 98.6°F | Resp 16 | Ht 65.0 in | Wt 201.4 lb

## 2023-03-20 DIAGNOSIS — B309 Viral conjunctivitis, unspecified: Secondary | ICD-10-CM | POA: Insufficient documentation

## 2023-03-20 DIAGNOSIS — J370 Chronic laryngitis: Secondary | ICD-10-CM | POA: Insufficient documentation

## 2023-03-20 MED ORDER — NEOMYCIN-POLYMYXIN-DEXAMETH 3.5-10000-0.1 OP SUSP: 2.0000 [drp] | Freq: Four times a day (QID) | OPHTHALMIC | 0 refills | Status: DC

## 2023-03-20 MED ORDER — METHYLPREDNISOLONE 4 MG PO TBPK: ORAL_TABLET | ORAL | 0 refills | Status: AC

## 2023-03-20 NOTE — Patient Instructions (Signed)
Viral Conjunctivitis, Adult  Viral conjunctivitis is an inflammation of the conjunctiva. The conjunctiva is the clear membrane that covers the white part of the eye and the inner surface of the eyelid. The inflammation is caused by a viral infection. The blood vessels in the conjunctiva become large, causing the eye to become red or pink and often itchy and tearing. The inflammation usually starts in one eye and goes to the other in a day or two. Infections usually go away over 1-2 weeks. Viral conjunctivitis is contagious. This means it can be easily passed from one person to another. This condition is often called pink eye. What are the causes? This condition is caused by a virus. It can be spread by touching objects that have been contaminated with the virus, such as doorknobs or towels, and then touching your eye. It can also be passed through tiny droplets, such as from coughing or sneezing. What increases the risk? You are more likely to develop this condition if you have a cold or the flu, or are in close contact with a person who has pink eye. What are the signs or symptoms? Symptoms of this condition include: Redness in the eye. Tearing or watery eyes. Itchy and irritated eyes. Burning feeling in the eyes. Clear drainage from the eye. Swollen eyelids. A gritty feeling in the eye. Light sensitivity. This condition often occurs with other symptoms, such as nasal congestion, cough, and fever. How is this diagnosed? This condition is diagnosed with a medical history and physical exam. If you have discharge from your eye, the discharge may be tested for a virus or to rule out other causes of conjunctivitis. How is this treated? Viral conjunctivitis does not respond to medicines that kill bacteria (antibiotics). The condition most often goes away on its own in 1-2 weeks. If treatment is needed, it is aimed at relieving your symptoms and preventing the spread of infection. This may be done  with artificial tear drops, antihistamine drops, or other eye medicines. In rare cases, steroid eye drops or anti-herpes virus medicines may be prescribed. Follow these instructions at home: Medicines  Take or apply over-the-counter and prescription medicines only as told by your health care provider. Do not touch the edge of the eyelid with the eye-drop bottle or ointment tube when applying medicines to the affected eye. This will prevent the spread of the infection to the other eye or to other people. Eye care Avoid touching or rubbing your eyes. Apply a clean, cool, wet washcloth onto your eye for 10-20 minutes, 3-4 times per day, or as told by your health care provider. If you wear contact lenses, do notwear them until the inflammation is gone and your health care provider says it is safe to wear them again. Ask your health care provider how to disinfect or replace your contact lenses before using them again. Wear glasses until you can resume wearing contacts. Avoid wearing eye makeup until the inflammation is gone. Throw away any old eye cosmetics that may be contaminated. Gently wipe away any crusting from your eye with a wet washcloth or a cotton ball. General instructions Change or wash your pillowcase every day or as told by your health care provider. Do not share towels, pillowcases, washcloths, eye makeup, makeup brushes, eye drops, contact lenses, or eyeglasses. This may spread the infection. Wash your hands often with soap and water. Use paper towels to dry your hands. If soap and water are not available, use hand sanitizer. Avoid contact  with other people until your eye is no longer red and tearing, or as told by your health care provider. Keep all follow-up visits. Contact a health care provider if: Your symptoms do not improve with treatment, or they get worse. You have increased pain. Your vision becomes blurry. You have a fever. You have facial pain, redness, or  swelling. You have yellow or green drainage coming from your eye. You have new symptoms. Get help right away if: You develop severe pain. Your vision gets much worse. Summary Viral conjunctivitis is an inflammation of the conjunctiva. It usually goes away in 1-2 weeks. The condition is caused by a virus and is spread by touching contaminated objects or breathing in droplets from a cough or a sneeze. This condition is usually treated with medicines and cold compresses to relieve the symptoms. Because it is caused by a virus, it should not be treated with antibiotics. This condition is very contagious. To prevent infection, avoid close contact with others, wash your hands often, and do not share towels or washcloths. Contact a health care provider if your symptoms do not go away with treatment, or if you have blurry vision, facial swelling, or increased pain. This information is not intended to replace advice given to you by your health care provider. Make sure you discuss any questions you have with your health care provider. Document Revised: 02/17/2021 Document Reviewed: 02/17/2021 Elsevier Patient Education  2024 [employer].

## 2023-03-20 NOTE — Progress Notes (Unsigned)
 Subjective:  Patient ID: Rhonda [name], female    DOB: 18-Jan-1971  Age: 53 y.o. MRN: [id number]  CC: Conjunctivitis   HPI Rhonda [name] presents for ***  Outpatient Medications Prior to Visit  Medication Sig Dispense Refill   acetaminophen (TYLENOL) 325 MG tablet Take 325 mg by mouth every 4 (four) hours as needed for fever.     esomeprazole (NEXIUM) 40 MG capsule Take 1 capsule (40 mg total) by mouth daily at 12 noon. 90 capsule 1   FeFum-FePoly-FA-B Cmp-C-Biot (INTEGRA PLUS) CAPS 1 capsule p.o. every other day with vitamin C or orange juice 30 capsule 3   fluticasone (FLONASE) 50 MCG/ACT nasal spray Place 2 sprays into both nostrils daily. 16 g 6   potassium chloride SA (KLOR-CON M) 20 MEQ tablet TAKE 1 TABLET(20 MEQ) BY MOUTH TWICE DAILY (Patient not taking: Reported on 03/20/2023) 180 tablet 0   neomycin-polymyxin-hydrocortisone (CORTISPORIN) 3.5-10000-1 OTIC suspension Place 3 drops into both ears 3 (three) times daily. (Patient not taking: Reported on 03/20/2023) 10 mL 0   promethazine-dextromethorphan (PROMETHAZINE-DM) 6.25-15 MG/5ML syrup Take 5 mLs by mouth 4 (four) times daily as needed. 118 mL 0   triamcinolone cream (KENALOG) 0.5 % Apply 1 Application topically 2 (two) times daily. 30 g 1   No facility-administered medications prior to visit.    ROS Review of Systems  Objective:  BP 122/66 (BP Location: Left Arm, Patient Position: Sitting, Cuff Size: Normal)   Pulse 81   Temp 98.6 F (37 C) (Oral)   Resp 16   Ht 5\' 5"  (1.651 m)   Wt 201 lb 6.4 oz (91.4 kg)   SpO2 98%   BMI 33.51 kg/m   BP Readings from Last 3 Encounters:  03/20/23 122/66  03/01/23 123/73  02/21/23 116/70    Wt Readings from Last 3 Encounters:  03/20/23 201 lb 6.4 oz (91.4 kg)  03/01/23 198 lb 6.4 oz (90 kg)  02/21/23 197 lb 3.2 oz (89.4 kg)    Physical Exam  Lab Results  Component Value Date   WBC 5.5 03/01/2023   HGB 13.8 03/01/2023   HCT 41.0 03/01/2023   PLT 410 (H)  03/01/2023   GLUCOSE 115 (H) 03/01/2023   CHOL 168 11/01/2021   TRIG 56.0 11/01/2021   HDL 69.70 11/01/2021   LDLCALC 87 11/01/2021   ALT 10 03/01/2023   AST 14 (L) 03/01/2023   NA 138 03/01/2023   K 3.7 03/01/2023   CL 101 03/01/2023   CREATININE 0.78 03/01/2023   BUN 6 03/01/2023   CO2 29 03/01/2023   TSH 0.55 03/07/2022   HGBA1C 5.6 11/01/2021       CT Soft Tissue Neck W Contrast Result Date: 12/19/2022 CLINICAL DATA:  Right ear pain and discomfort over the last 2 weeks. History of tonsillectomy. Neck mass. EXAM: CT NECK WITH CONTRAST TECHNIQUE: Multidetector CT imaging of the neck was performed using the standard protocol following the bolus administration of intravenous contrast. RADIATION DOSE REDUCTION: This exam was performed according to the departmental dose-optimization program which includes automated exposure control, adjustment of the mA and/or kV according to patient size and/or use of iterative reconstruction technique. CONTRAST:  75mL ISOVUE-300 IOPAMIDOL (ISOVUE-300) INJECTION 61% COMPARISON:  MRI 10/08/2022 FINDINGS: Pharynx and larynx: No mucosal or submucosal lesion. Salivary glands: Parotid and submandibular glands are normal. No evidence of parotid mass or inflammation. Thyroid: Normal Lymph nodes: No lymphadenopathy on either side of the neck. Normal bilateral cervical nodes. Vascular: No abnormal vascular finding.  Limited intracranial: Normal Visualized orbits: Normal Mastoids and visualized paranasal sinuses: Clear sinuses. No fluid seen at either mastoid tip. Middle ears not included in the region studied. Skeleton: Normal Upper chest: Lung apices are clear. Other: None IMPRESSION: Normal CT scan of the neck. No abnormality seen to explain the clinical presentation. No evidence of parotid mass or inflammation. No lymphadenopathy. Mastoid tips are clear. Electronically Signed   By: Paulina Fusi M.D.   On: 12/19/2022 16:24    Assessment & Plan:  Laryngitis,  chronic -     methylPREDNISolone; TAKE AS DIRECTED  Dispense: 21 tablet; Refill: 0  Acute viral conjunctivitis of both eyes -     Neomycin-Polymyxin-Dexameth; Place 2 drops into both eyes every 6 (six) hours.  Dispense: 5 mL; Refill: 0 -     methylPREDNISolone; TAKE AS DIRECTED  Dispense: 21 tablet; Refill: 0     Follow-up: No follow-ups on file.  Sanda Linger, MD

## 2023-03-31 ENCOUNTER — Ambulatory Visit: Payer: Federal, State, Local not specified - PPO

## 2023-03-31 DIAGNOSIS — E538 Deficiency of other specified B group vitamins: Secondary | ICD-10-CM | POA: Diagnosis not present

## 2023-03-31 MED ORDER — CYANOCOBALAMIN 1000 MCG/ML IJ SOLN
1000.0000 ug | Freq: Once | INTRAMUSCULAR | Status: AC
  Administered 2023-03-31: 1000 ug via INTRAMUSCULAR

## 2023-03-31 NOTE — Progress Notes (Signed)
 Patient visits today for their b-12 injection. Patient informed of what they had received and tolerated the injection well. Patient notified to reach out to the office if needed.

## 2023-04-03 ENCOUNTER — Encounter: Admitting: Internal Medicine

## 2023-04-12 ENCOUNTER — Telehealth (INDEPENDENT_AMBULATORY_CARE_PROVIDER_SITE_OTHER): Payer: Self-pay | Admitting: Otolaryngology

## 2023-04-12 NOTE — Telephone Encounter (Signed)
 Left vm to confirm appt date and location for 04/13/2023.

## 2023-04-13 ENCOUNTER — Ambulatory Visit (INDEPENDENT_AMBULATORY_CARE_PROVIDER_SITE_OTHER): Payer: Federal, State, Local not specified - PPO | Admitting: Otolaryngology

## 2023-04-13 ENCOUNTER — Encounter: Payer: Self-pay | Admitting: Gastroenterology

## 2023-04-13 ENCOUNTER — Encounter (INDEPENDENT_AMBULATORY_CARE_PROVIDER_SITE_OTHER): Payer: Self-pay

## 2023-04-13 VITALS — BP 115/78 | HR 92 | Ht 65.0 in | Wt 198.0 lb

## 2023-04-13 DIAGNOSIS — H6991 Unspecified Eustachian tube disorder, right ear: Secondary | ICD-10-CM | POA: Diagnosis not present

## 2023-04-13 DIAGNOSIS — R490 Dysphonia: Secondary | ICD-10-CM | POA: Diagnosis not present

## 2023-04-13 DIAGNOSIS — R09A2 Foreign body sensation, throat: Secondary | ICD-10-CM

## 2023-04-13 DIAGNOSIS — H938X1 Other specified disorders of right ear: Secondary | ICD-10-CM | POA: Diagnosis not present

## 2023-04-13 DIAGNOSIS — H9201 Otalgia, right ear: Secondary | ICD-10-CM

## 2023-04-13 DIAGNOSIS — H9190 Unspecified hearing loss, unspecified ear: Secondary | ICD-10-CM | POA: Diagnosis not present

## 2023-04-13 DIAGNOSIS — R0982 Postnasal drip: Secondary | ICD-10-CM

## 2023-04-13 DIAGNOSIS — K219 Gastro-esophageal reflux disease without esophagitis: Secondary | ICD-10-CM

## 2023-04-13 MED ORDER — FLUTICASONE PROPIONATE 50 MCG/ACT NA SUSP: 2.0000 | Freq: Every day | NASAL | 6 refills | Status: DC

## 2023-04-13 MED ORDER — PREDNISONE 20 MG PO TABS: 20.0000 mg | ORAL_TABLET | Freq: Every day | ORAL | 0 refills | Status: AC

## 2023-04-13 NOTE — Progress Notes (Signed)
 Dear Dr. Okey Dupre, Here is my assessment for our mutual patient, Rhonda [name]. Thank you for allowing me the opportunity to care for your patient. Please do not hesitate to contact me should you have any other questions. Sincerely, Dr. Jovita Kussmaul  Otolaryngology Clinic Note Referring provider: Dr. Okey Dupre HPI:  Rhonda [name] is a 53 y.o. female kindly referred by Dr. Okey Dupre for evaluation of right ear pain and multiple other complaints.  Initial visit (03/2023): Patient reports: history of several ear infections (saw Dr. Haroldine Laws prior), and then saw Dr. Ezzard Standing for this. She reports that she had a sinus infection/URI in January 2025, which included a sore throat and ear pain, when she had hoarseness and was prescribed an antibiotic. She reports that the ears are still bothersome, including right ear feeling full, having popping/crackling, and has some sensitivity to the wind/cold. Also reports continued hoarseness and some globus sensation after her infection. Voice is worse with use.  Separately, however, over the past couple of years, she also reports chronic ear pain and discomfort and fullness. Hearing is muffled and some tinnitus (non-pulsatile). She did have a CT Neck in Oct to assess this (see below) - did not note any enhancing lesions. Does report some chronic neck pain, no bruxism. 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  Does have GERD - takes a PPI (AM); reports night time reflux/burning still - also with burping and belching 32 oz water/day; Dr. Alcus Dad - cutting back on that too  Patient denies: vertigo, drainage Patient additionally denies: deep pain in ear canal Patient also denies barotrauma, vestibular suppressant use, ototoxic medication use Prior ear surgery: no  Patient otherwise denies: - dysphagia, odynophagia, PNA, need for Heimlich,  unintentional weight loss, cough - shortness of breath, hemoptysis, tobacco or significant alcohol history - neck masses  H&N Surgery: no Personal or FHx of bleeding dz or anesthesia difficulty: no  GLP-1: no AP/AC: no  Tobacco: no. Occupation: works for the [employer]  PMHx: Thrombocytosis, AR, GERD, IBS, Chiari,   Independent Review of Additional Tests or Records:  Dr. Ezzard Standing (09/10/2019) [facility] - chronic ear complaints, intermittent ear pain worse on right; fullness in ears and popping; no drainage; ETD - rec [place] Dr. Okey Dupre IM referral notes 02/20/2023: Noted acute cough/congestion, right ear pain; chronic right ear symptoms, no current ENT; Dx: Ear pain; Rx: Ref ENT, Rx: Flonase Dr. Okey Dupre (10/2022): Noted anterior neck pain, which is chronic and right ear pain; Dx: Chronic pain; Rx: CT Neck Dr. Yetta Barre IM (03/20/2023): Laryngitis past month; sensation of something in her throat; no trouble breathing; Dx: Laryngitis; Rx: Methylpred Labs: CBC 03/01/2023: WBC 5.5, Hgb 13.8, Plt 410; CMP 03/01/2023: Cr 0.78, BUN 6 CT Neck with Contrast 11/25/2022 independently reviewed and interpreted: no parotid or SMG nodules; mastoid tips well aerated; no significant paranasal sinus disease or enhancing masses; no concerning lymphadenopathy per my read MRI Brain 2024: no retrocochlear lesions but suboptimal study  PMH/Meds/All/SocHx/FamHx/ROS:   Past Medical History:  Diagnosis Date   Allergic rhinitis    Allergy    B12 deficiency    Carpal tunnel syndrome    Dysfunction of eustachian tube      Past Surgical History:  Procedure Laterality Date   BUNIONECTOMY     right    CHOLECYSTECTOMY N/A 09/25/2015   Procedure: LAPAROSCOPIC CHOLECYSTECTOMY WITH INTRAOPERATIVE CHOLANGIOGRAM;  Surgeon: Ovidio Kin, MD;  Location: [facility];  Service: General;  Laterality: N/A;   TONSILLECTOMY      Family History  Problem Relation Age of Onset   Heart disease Father    Hypertension Father    Diabetes Father     Hyperlipidemia Mother    Hypertension Mother    Heart disease Maternal Grandmother    Diabetes Brother    Headache Neg Hx    Colon cancer Neg Hx    Prostate cancer Neg Hx    Rectal cancer Neg Hx    Stomach cancer Neg Hx    Esophageal cancer Neg Hx      Social Connections: Not on file      Current Outpatient Medications:    acetaminophen (TYLENOL) 325 MG tablet, Take 325 mg by mouth every 4 (four) hours as needed for fever., Disp: , Rfl:    esomeprazole (NEXIUM) 40 MG capsule, Take 1 capsule (40 mg total) by mouth daily at 12 noon., Disp: 90 capsule, Rfl: 1   FeFum-FePoly-FA-B Cmp-C-Biot (INTEGRA PLUS) CAPS, 1 capsule p.o. every other day with vitamin C or orange juice, Disp: 30 capsule, Rfl: 3   fluticasone (FLONASE) 50 MCG/ACT nasal spray, Place 2 sprays into both nostrils daily., Disp: 16 g, Rfl: 6   fluticasone (FLONASE) 50 MCG/ACT nasal spray, Place 2 sprays into both nostrils daily., Disp: 16 g, Rfl: 6   potassium chloride SA (KLOR-CON M) 20 MEQ tablet, TAKE 1 TABLET(20 MEQ) BY MOUTH TWICE DAILY, Disp: 180 tablet, Rfl: 0   predniSONE (DELTASONE) 20 MG tablet, Take 1 tablet (20 mg total) by mouth daily with breakfast for 7 days., Disp: 7 tablet, Rfl: 0   neomycin-polymyxin b-dexamethasone (MAXITROL) 3.5-10000-0.1 SUSP, Place 2 drops into both eyes every 6 (six) hours. (Patient not taking: Reported on 04/13/2023), Disp: 5 mL, Rfl: 0   Physical Exam:   BP 115/78 (BP Location: Left Arm, Patient Position: Sitting, Cuff Size: Large)   Pulse 92   Ht 5\' 5"  (1.651 m)   Wt 198 lb (89.8 kg)   SpO2 96%   BMI 32.95 kg/m   Salient findings:  CN II-XII intact Bilateral EAC clear and TM intact with well pneumatized middle ear spaces, mild global retraction on the right Weber 512: mid Rinne 512: AC > BC b/l  Anterior rhinoscopy: Septum deviates left; bilateral inferior turbinates without significant hypertrophy No lesions of oral cavity/oropharynx; dentition fair No obviously palpable  neck masses/lymphadenopathy/thyromegaly No respiratory distress or stridor; TFL was indicated to better evaluate the proximal airway, given the patient's history and exam findings, and is detailed below  Seprately Identifiable Procedures:  Procedure Note Pre-procedure diagnosis:  Dysphonia, Globus sensation Post-procedure diagnosis: Same Procedure: Transnasal Fiberoptic Laryngoscopy, CPT 31575 - Mod 25 Indication: see above Complications: None apparent EBL: 0 mL  The procedure was undertaken to further evaluate the patient's complaint of dysphonia and globus sensation, with mirror exam inadequate for appropriate examination due to gag reflex and poor patient tolerance  Procedure:  Patient was identified as correct patient. Verbal consent was obtained. The nose was sprayed with oxymetazoline and 4% lidocaine. The The flexible laryngoscope was passed through the nose to view the nasal cavity, pharynx (oropharynx, hypopharynx) and larynx.  The larynx was examined at rest and during multiple phonatory tasks. Documentation was obtained and reviewed with patient. The scope was removed. The patient tolerated the procedure well.  Findings: The nasal cavity and nasopharynx did not reveal any masses or lesions, mucosa appeared to be without obvious lesions. The tongue base, pharyngeal  walls, piriform sinuses, vallecula, epiglottis and postcricoid region are normal in appearance with mild post-cricoid edema; modest A-P compression of supraglottis suggestive of muscle tension dysphonia. The visualized portion of the subglottis and proximal trachea is widely patent. The vocal folds are mobile bilaterally. There are no lesions on the free edge of the vocal folds nor elsewhere in the larynx worrisome for malignancy.   No masses over either eustachian tube and no purulence in nasal cavity.  Electronically signed by: Read Drivers, MD 04/13/2023 10:54 AM   Impression & Plans:  Rhonda [name] is a 53 y.o.  female with:  1. Sensation of fullness in right ear   2. Subjective hearing loss   3. Dysfunction of right eustachian tube   4. Dysphonia   5. Post-nasal drip   6. Globus sensation   7. Laryngopharyngeal reflux (LPR)   8. Muscle tension dysphonia   9. Ear pain, right    Multiple issues today, primarily which is right ear fullness, discomfort and subjective HL; we discussed the wide DDX of her problem which includes ETD and referred etiologies; she has had a prior CT during this and an MRI which were reviewed and did not appear to show any primary ear pathology. Does have neck pain which could be a cause, and currently expect a component of ETD and referred pain.  We discussed options for this, and will start with short pred burst and flonase and autoinsufflation of ears  For her laryngeal complaints, she does have GERD which continues to be symptomatic especially at night. Her sx appear to have started after a URI and she is under more stress at work so LPR, MTD, and post-viral etiologies are all possible.  TFL is reassuring We will therefore add Pepcid to her regimen, continue nexium daily and add a barrier agent (Reflux gourmet) as well.  F/u in 3 months with hearing test; if sx persist, can consider Right tymp tube, though I explained it may not help -- only thing it might help with is fullness.  See below regarding exact medications prescribed this encounter including dosages and route: Meds ordered this encounter  Medications   fluticasone (FLONASE) 50 MCG/ACT nasal spray    Sig: Place 2 sprays into both nostrils daily.    Dispense:  16 g    Refill:  6   predniSONE (DELTASONE) 20 MG tablet    Sig: Take 1 tablet (20 mg total) by mouth daily with breakfast for 7 days.    Dispense:  7 tablet    Refill:  0      Thank you for allowing me the opportunity to care for your patient. Please do not hesitate to contact me should you have any other questions.  Sincerely, Jovita Kussmaul,  MD Otolaryngologist (ENT), [facility] Phone: [phone] Fax: [phone]  04/13/2023, 10:54 AM   I have personally spent 65 minutes involved in face-to-face and non-face-to-face activities for this patient on the day of the visit.  Professional time spent excludes any procedures performed but includes the following activities, in addition to those noted in the documentation: preparing to see the patient (review of outside documentation and results), performing a medically appropriate examination, counseling, ordering medications (pred, flonase), documenting in the electronic health record, independently interpreting results (CT, MRI)

## 2023-04-13 NOTE — Patient Instructions (Addendum)
 Take nexium during day; take famotidine at night time Take reflux gourmet (buy on [place]) - after meals  Flonase two sprays for 6 weeks Prednisone 20mg  for 7 days

## 2023-05-30 ENCOUNTER — Inpatient Hospital Stay (HOSPITAL_BASED_OUTPATIENT_CLINIC_OR_DEPARTMENT_OTHER): Payer: Federal, State, Local not specified - PPO | Admitting: Internal Medicine

## 2023-05-30 ENCOUNTER — Inpatient Hospital Stay: Payer: Federal, State, Local not specified - PPO | Attending: Internal Medicine

## 2023-05-30 VITALS — BP 116/72 | HR 82 | Temp 97.4°F | Resp 16 | Ht 65.0 in | Wt 210.6 lb

## 2023-05-30 DIAGNOSIS — D75839 Thrombocytosis, unspecified: Secondary | ICD-10-CM

## 2023-05-30 DIAGNOSIS — D509 Iron deficiency anemia, unspecified: Secondary | ICD-10-CM | POA: Diagnosis not present

## 2023-05-30 DIAGNOSIS — R202 Paresthesia of skin: Secondary | ICD-10-CM | POA: Insufficient documentation

## 2023-05-30 DIAGNOSIS — D473 Essential (hemorrhagic) thrombocythemia: Secondary | ICD-10-CM

## 2023-05-30 LAB — CBC WITH DIFFERENTIAL (CANCER CENTER ONLY)
Abs Immature Granulocytes: 0.03 10*3/uL (ref 0.00–0.07)
Basophils Absolute: 0.1 10*3/uL (ref 0.0–0.1)
Basophils Relative: 1 %
Eosinophils Absolute: 0.2 10*3/uL (ref 0.0–0.5)
Eosinophils Relative: 2 %
HCT: 40.4 % (ref 36.0–46.0)
Hemoglobin: 13.7 g/dL (ref 12.0–15.0)
Immature Granulocytes: 0 %
Lymphocytes Relative: 36 %
Lymphs Abs: 2.5 10*3/uL (ref 0.7–4.0)
MCH: 30 pg (ref 26.0–34.0)
MCHC: 33.9 g/dL (ref 30.0–36.0)
MCV: 88.6 fL (ref 80.0–100.0)
Monocytes Absolute: 0.6 10*3/uL (ref 0.1–1.0)
Monocytes Relative: 8 %
Neutro Abs: 3.8 10*3/uL (ref 1.7–7.7)
Neutrophils Relative %: 53 %
Platelet Count: 396 10*3/uL (ref 150–400)
RBC: 4.56 MIL/uL (ref 3.87–5.11)
RDW: 13.2 % (ref 11.5–15.5)
WBC Count: 7.1 10*3/uL (ref 4.0–10.5)
nRBC: 0 % (ref 0.0–0.2)

## 2023-05-30 LAB — IRON AND IRON BINDING CAPACITY (CC-WL,HP ONLY)
Iron: 44 ug/dL (ref 28–170)
Saturation Ratios: 12 % (ref 10.4–31.8)
TIBC: 356 ug/dL (ref 250–450)
UIBC: 312 ug/dL (ref 148–442)

## 2023-05-30 NOTE — Progress Notes (Signed)
 [facility] [phone]   [phone]  OFFICE PROGRESS NOTE  Arcadio Knuckles, MD [address] [place] [address]  DIAGNOSIS: Thrombocytosis likely reactive in nature secondary to iron deficiency.   She has negative JAK2 mutation.  PRIOR THERAPY: None  CURRENT THERAPY: Integra +1 capsule p.o. daily  INTERVAL HISTORY: Rhonda [name] 53 y.o. female returns to the clinic today for follow-up visit.Discussed the use of AI scribe software for clinical note transcription with the patient, who gave verbal consent to proceed.  History of Present Illness   Rhonda [name] is a 53 year old female with thrombocytosis likely secondary to iron deficiency who presents for evaluation and repeat blood work.  She has a history of thrombocytosis, likely reactive due to iron deficiency. Her JAK2 mutation panel was negative, ruling out essential thrombocythemia. She was previously on iron supplementation with Integra plus , one capsule orally per day, but has recently discontinued it. Her complete blood count (CBC) has normalized, with platelet levels now at 396. She has been taking 'regonol', which she believes may have contributed to the improvement in her platelet count.  She experiences increased tingling in her hands, noting that they 'go numb real quick.' Her B12 levels were last checked at 290, a level she questions. No other new complaints aside from the tingling in her hands.       MEDICAL HISTORY: Past Medical History:  Diagnosis Date   Allergic rhinitis    Allergy    B12 deficiency    Carpal tunnel syndrome    Dysfunction of eustachian tube     ALLERGIES:  is allergic to bactrim  ds [sulfamethoxazole -trimethoprim ], cephalosporins, oseltamivir phosphate, amoxicillin , and fluconazole .  MEDICATIONS:  Current Outpatient Medications  Medication Sig Dispense Refill   acetaminophen  (TYLENOL ) 325 MG tablet Take 325 mg by mouth every 4 (four) hours  as needed for fever.     esomeprazole  (NEXIUM ) 40 MG capsule Take 1 capsule (40 mg total) by mouth daily at 12 noon. 90 capsule 1   FeFum-FePoly-FA-B Cmp-C-Biot (INTEGRA PLUS ) CAPS 1 capsule p.o. every other day with vitamin C or orange juice 30 capsule 3   fluticasone  (FLONASE ) 50 MCG/ACT nasal spray Place 2 sprays into both nostrils daily. 16 g 6   fluticasone  (FLONASE ) 50 MCG/ACT nasal spray Place 2 sprays into both nostrils daily. 16 g 6   neomycin -polymyxin b-dexamethasone  (MAXITROL) 3.5-10000-0.1 SUSP Place 2 drops into both eyes every 6 (six) hours. (Patient not taking: Reported on 04/13/2023) 5 mL 0   potassium chloride  SA (KLOR-CON  M) 20 MEQ tablet TAKE 1 TABLET(20 MEQ) BY MOUTH TWICE DAILY 180 tablet 0   No current facility-administered medications for this visit.    SURGICAL HISTORY:  Past Surgical History:  Procedure Laterality Date   BUNIONECTOMY     right    CHOLECYSTECTOMY N/A 09/25/2015   Procedure: LAPAROSCOPIC CHOLECYSTECTOMY WITH INTRAOPERATIVE CHOLANGIOGRAM;  Surgeon: Juanita Norlander, MD;  Location: [facility];  Service: General;  Laterality: N/A;   TONSILLECTOMY      REVIEW OF SYSTEMS:  A comprehensive review of systems was negative.   PHYSICAL EXAMINATION: General appearance: alert, cooperative, and no distress Head: Normocephalic, without obvious abnormality, atraumatic Neck: no adenopathy, no JVD, supple, symmetrical, trachea midline, and thyroid  not enlarged, symmetric, no tenderness/mass/nodules Lymph nodes: Cervical, supraclavicular, and axillary nodes normal. Resp: clear to auscultation bilaterally Back: symmetric, no curvature. ROM normal. No CVA tenderness. Cardio: regular rate and rhythm, S1, S2 normal, no murmur,  click, rub or gallop GI: soft, non-tender; bowel sounds normal; no masses,  no organomegaly Extremities: extremities normal, atraumatic, no cyanosis or edema  ECOG PERFORMANCE STATUS: 1 - Symptomatic but completely ambulatory  Blood pressure 116/72,  pulse 82, temperature (!) 97.4 F (36.3 C), temperature source Temporal, resp. rate 16, height 5\' 5"  (1.651 m), weight 210 lb 9.6 oz (95.5 kg), SpO2 100%.  LABORATORY DATA: Lab Results  Component Value Date   WBC 7.1 05/30/2023   HGB 13.7 05/30/2023   HCT 40.4 05/30/2023   MCV 88.6 05/30/2023   PLT 396 05/30/2023      Chemistry      Component Value Date/Time   NA 138 03/01/2023 0849   K 3.7 03/01/2023 0849   CL 101 03/01/2023 0849   CO2 29 03/01/2023 0849   BUN 6 03/01/2023 0849   CREATININE 0.78 03/01/2023 0849      Component Value Date/Time   CALCIUM 9.4 03/01/2023 0849   ALKPHOS 93 03/01/2023 0849   AST 14 (L) 03/01/2023 0849   ALT 10 03/01/2023 0849   BILITOT 0.9 03/01/2023 0849       RADIOGRAPHIC STUDIES: No results found.   ASSESSMENT AND PLAN: This is a pleasant 53 years old African-American female with Thrombocytosis likely reactive in nature secondary to iron deficiency.  She has negative JAK2 mutation.  She is currently on oral iron supplement with Integra +1 capsule p.o. daily.      Iron deficiency anemia Iron deficiency anemia likely secondary to inadequate iron intake. Previously on Integra iron supplementation, which she discontinued. Current blood work shows normal platelet levels, indicating improvement in iron status. Reports tingling in hands, possibly related to anemia or other causes. B12 level was normal at 290. - Restart Integra iron supplement, one capsule PO daily - Follow up on iron study results and contact if concerning  Thrombocytosis Thrombocytosis likely reactive due to iron deficiency. JAK2 mutation panel negative, ruling out essential thrombocythemia. Current CBC shows normal platelet count at 396, indicating resolution of thrombocytosis. No evidence of bone marrow pathology or other concerning causes. - No further action required as platelet levels are normal   The patient was advised to call immediately if she has any concerning  symptoms.   The patient voices understanding of current disease status and treatment options and is in agreement with the current care plan.  All questions were answered. The patient knows to call the clinic with any problems, questions or concerns. We can certainly see the patient much sooner if necessary.  The total time spent in the appointment was 20 minutes.  Disclaimer: This note was dictated with voice recognition software. Similar sounding words can inadvertently be transcribed and may not be corrected upon review.

## 2023-05-31 LAB — FERRITIN: Ferritin: 57 ng/mL (ref 11–307)

## 2023-06-14 ENCOUNTER — Encounter: Payer: Self-pay | Admitting: Gastroenterology

## 2023-06-14 ENCOUNTER — Ambulatory Visit: Admitting: Gastroenterology

## 2023-06-14 VITALS — BP 116/64 | HR 104 | Ht 65.0 in | Wt 213.4 lb

## 2023-06-14 DIAGNOSIS — R09A2 Foreign body sensation, throat: Secondary | ICD-10-CM

## 2023-06-14 DIAGNOSIS — K5900 Constipation, unspecified: Secondary | ICD-10-CM | POA: Diagnosis not present

## 2023-06-14 DIAGNOSIS — R1012 Left upper quadrant pain: Secondary | ICD-10-CM | POA: Diagnosis not present

## 2023-06-14 DIAGNOSIS — K219 Gastro-esophageal reflux disease without esophagitis: Secondary | ICD-10-CM

## 2023-06-14 DIAGNOSIS — R140 Abdominal distension (gaseous): Secondary | ICD-10-CM

## 2023-06-14 MED ORDER — ESOMEPRAZOLE MAGNESIUM 40 MG PO CPDR: 40.0000 mg | DELAYED_RELEASE_CAPSULE | Freq: Every day | ORAL | Status: DC

## 2023-06-14 NOTE — Patient Instructions (Signed)
 Take Nexium  40 mg once daily for 2 weeks. If not improved you can take twice daily.  Take Miralax daily.  Please give us  an update in 1 month.  Thank you for entrusting me with your care and for choosing [facility], Dr. Alvester Johnson    If your blood pressure at your visit was 140/90 or greater, please contact your primary care physician to follow up on this. ______________________________________________________  If you are age 54 or older, your body mass index should be between 23-30. Your Body mass index is 35.51 kg/m. If this is out of the aforementioned range listed, please consider follow up with your Primary Care Provider.  If you are age 55 or younger, your body mass index should be between 19-25. Your Body mass index is 35.51 kg/m. If this is out of the aformentioned range listed, please consider follow up with your Primary Care Provider.  ________________________________________________________  The [facility] providers would like to encourage you to use MYCHART to communicate with providers for non-urgent requests or questions.  Due to long hold times on the telephone, sending your provider a message by [facility] may be a faster and more efficient way to get a response.  Please allow 48 business hours for a response.  Please remember that this is for non-urgent requests.  _______________________________________________________  Due to recent changes in healthcare laws, you may see the results of your imaging and laboratory studies on MyChart before your provider has had a chance to review them.  We understand that in some cases there may be results that are confusing or concerning to you. Not all laboratory results come back in the same time frame and the provider may be waiting for multiple results in order to interpret others.  Please give us  48 hours in order for your provider to thoroughly review all the results before contacting the office for clarification of your  results.

## 2023-06-14 NOTE — Progress Notes (Signed)
 HPI :  53 year old female with GERD and remote B12 deficiency who presents to reestablish care for her gastrointestinal issues.  She has experienced a sensation of something being stuck in her throat for the past six months, described as 'feels like something's stuck there all the time.' This sensation is relieved by burping and tends to come and go. It does not occur while swallowing food but is more noticeable when sitting. She experiences heartburn and reflux symptoms, particularly at night, with a burning sensation that extends to her throat. She has been inconsistent with her prescribed Nexium  40 mg, taking it only when she remembers or when symptoms worsen.  Her history of GERD dates back several years, and she has been on Nexium  in the past. An upper endoscopy performed on September 12, 2019, showed mild gastric erythema and a few small gastric polyps, which were benign. There was no evidence of Barrett's esophagus. A colonoscopy on the same day revealed sigmoid diverticulosis and small internal hemorrhoids, but no polyps. Pathology showed fundic gland polyps with no H. pylori infection.  She reports constipation, with bowel movements occurring every other day on average. She describes occasional straining and hard stools but notes that her bowel movements have been more regular recently. She does not currently take any medication for her bowels.  She experiences intermittent mild abdominal pain for the past two months, located in the LUQ, which she associates with gas. The pain is not severe and is relieved by rubbing the area. She reports frequent gas throughout the day.  Her B12 levels have been low in the past, requiring monthly B12 injections, which she often forgets. Her most recent B12 level was at the lower limit of normal. EGD did not show a clear cause for this. She has had anti-instrinic factor AB negative many  years ago. She has a history of elevated platelet counts, which were  evaluated by a hematologist, but no current issues were noted. She does not take iron supplements and her recent labs showed normal ferritin, iron levels and saturation.    Past Medical History:  Diagnosis Date   Allergic rhinitis    Allergy    B12 deficiency    Carpal tunnel syndrome    Dysfunction of eustachian tube    GERD (gastroesophageal reflux disease)      Past Surgical History:  Procedure Laterality Date   BUNIONECTOMY     right    CHOLECYSTECTOMY N/A 09/25/2015   Procedure: LAPAROSCOPIC CHOLECYSTECTOMY WITH INTRAOPERATIVE CHOLANGIOGRAM;  Surgeon: Juanita Norlander, MD;  Location: [facility];  Service: General;  Laterality: N/A;   TONSILLECTOMY     Family History  Problem Relation Age of Onset   Hyperlipidemia Mother    Hypertension Mother    Heart disease Father    Hypertension Father    Diabetes Father    Diabetes Brother    Kidney cancer Brother 10   Diabetes Brother    Heart disease Maternal Grandmother    Headache Neg Hx    Colon cancer Neg Hx    Prostate cancer Neg Hx    Rectal cancer Neg Hx    Stomach cancer Neg Hx    Esophageal cancer Neg Hx    Social History   Tobacco Use   Smoking status: Never   Smokeless tobacco: Never  Vaping Use   Vaping status: Never Used  Substance Use Topics   Alcohol use: No   Drug use: No   Current Outpatient Medications  Medication Sig  Dispense Refill   acetaminophen  (TYLENOL ) 325 MG tablet Take 325 mg by mouth every 4 (four) hours as needed for fever.     FeFum-FePoly-FA-B Cmp-C-Biot (INTEGRA PLUS ) CAPS 1 capsule p.o. every other day with vitamin C or orange juice 30 capsule 3   fluticasone  (FLONASE ) 50 MCG/ACT nasal spray Place 2 sprays into both nostrils daily. 16 g 6   esomeprazole  (NEXIUM ) 40 MG capsule Take 1 capsule (40 mg total) by mouth daily at 12 noon. Can increase to twice a day if needed     potassium chloride  SA (KLOR-CON  M) 20 MEQ tablet TAKE 1 TABLET(20 MEQ) BY MOUTH TWICE DAILY (Patient not taking: Reported  on 06/14/2023) 180 tablet 0   No current facility-administered medications for this visit.   Allergies  Allergen Reactions   Bactrim  Ds [Sulfamethoxazole -Trimethoprim ] Other (See Comments)    Blisters    Cephalosporins     Hives    Oseltamivir Phosphate Swelling   Amoxicillin  Hives and Rash    Has patient had a PCN reaction causing immediate rash, facial/tongue/throat swelling, SOB or lightheadedness with hypotension: Yes Has patient had a PCN reaction causing severe rash involving mucus membranes or skin necrosis: Yes Has patient had a PCN reaction that required hospitalization No Has patient had a PCN reaction occurring within the last 10 years: Yes If all of the above answers are "NO", then may proceed with Cephalosporin use.    Fluconazole  Rash    Added blisters      Review of Systems: All systems reviewed and negative except where noted in HPI.   Lab Results  Component Value Date   WBC 7.1 05/30/2023   HGB 13.7 05/30/2023   HCT 40.4 05/30/2023   MCV 88.6 05/30/2023   PLT 396 05/30/2023    Lab Results  Component Value Date   IRON 44 05/30/2023   TIBC 356 05/30/2023   FERRITIN 57 05/30/2023     Physical Exam: BP 116/64   Pulse (!) 104   Ht 5\' 5"  (1.651 m)   Wt 213 lb 6 oz (96.8 kg)   SpO2 96%   BMI 35.51 kg/m  Constitutional: Pleasant,well-developed, female in no acute distress. HEENT: Normocephalic and atraumatic. Conjunctivae are normal. No scleral icterus. Neck supple.  Cardiovascular: Normal rate, regular rhythm.  Pulmonary/chest: Effort normal and breath sounds normal. No wheezing, rales or rhonchi. Abdominal: Soft, nondistended, nontender.  There are no masses palpable. No hepatomegaly. Extremities: no edema Lymphadenopathy: No cervical adenopathy noted. Neurological: Alert and oriented to person place and time. Skin: Skin is warm and dry. No rashes noted. Psychiatric: Normal mood and affect. Behavior is normal.   ASSESSMENT: 53 y.o. female  here for assessment of the following  1. Globus sensation   2. Gastroesophageal reflux disease, unspecified whether esophagitis present   3. LUQ pain   4. Constipation, unspecified constipation type   5. Bloating   6. Gastroesophageal reflux disease without esophagitis     Gastroesophageal reflux disease (GERD) / Globus Chronic GERD with nocturnal heartburn and regurgitation. Inconsistent Nexium  use led to persistent symptoms. Previous endoscopy showed no precancerous changes. High suspicion of reflux contributing to globus sensation. - Take Nexium  40 mg once daily for two weeks, 30-60 minutes before eating. - If symptoms persist after two weeks, increase Nexium  to 40 mg twice daily for another two weeks. - Report if symptoms do not improve after one month of treatment, I asked her to contact me. If symptoms persist despite this will recommend EGD  Constipation /  Bloating / LUQ pain Intermittent constipation with infrequent bowel movements, occasional hard stools, and straining. Symptoms include gas and bloating. - Take MiraLAX once daily to promote regular bowel movements. - If MiraLAX is too strong, adjust to every other day or consider fiber gummies. - due for colonoscopy in 2031  Christi Coward, MD [facility]

## 2023-07-04 ENCOUNTER — Ambulatory Visit: Payer: Self-pay

## 2023-07-04 NOTE — Telephone Encounter (Signed)
 FYI Only or Action Required?: FYI only for provider  Patient was last seen in primary care on 03/20/2023 by Arcadio Knuckles, MD. Called Nurse Triage reporting Depression and Anxiety. Symptoms began several months ago. Interventions attempted: Rest, hydration, or home remedies. Symptoms are: gradually worsening.  Triage Disposition: See Physician Within 24 Hours  Patient/caregiver understands and will follow disposition?: Yes                     Copied from CRM [id number]. Topic: Clinical - Red Word Triage >> Jul 04, 2023  8:35 AM Chasity T wrote: [facility] that prompted transfer to Nurse Triage: Patient is calling in because she is having depression and anxiety. Reason for Disposition  [1] Depression AND [2] worsening (e.g., sleeping poorly, less able to do activities of daily living)  Answer Assessment - Initial Assessment Questions 1. CONCERN: "What happened that made you call today?"     Depression and anxiety 2. DEPRESSION SYMPTOM SCREENING: "How are you feeling overall?" (e.g., decreased energy, increased sleeping or difficulty sleeping, difficulty concentrating, feelings of sadness, guilt, hopelessness, or worthlessness)     Grief; "I'm crying inside"; "terrible insomnia, I don't get much sleep, if I do sleep it's not more than two hours"; anxiety 3. RISK OF HARM - SUICIDAL IDEATION:  "Do you ever have thoughts of hurting or killing yourself?"  (e.g., yes, no, no but preoccupation with thoughts about death)   - INTENT:  "Do you have thoughts of hurting or killing yourself right NOW?" (e.g., yes, no, N/A)   - PLAN: "Do you have a specific plan for how you would do this?" (e.g., gun, knife, overdose, no plan, N/A)     No  4. RISK OF HARM - HOMICIDAL IDEATION:  "Do you ever have thoughts of hurting or killing someone else?"  (e.g., yes, no, no but preoccupation with thoughts about death)   - INTENT:  "Do you have thoughts of hurting or killing someone right NOW?" (e.g., yes,  no, N/A)   - PLAN: "Do you have a specific plan for how you would do this?" (e.g., gun, knife, no plan, N/A)      No  5. FUNCTIONAL IMPAIRMENT: "How have things been going for you overall? Have you had more difficulty than usual doing your normal daily activities?"  (e.g., better, same, worse; self-care, school, work, interactions)     "Functioning depressed person", "I can't do anything else when I get home from work, I just want to sleep", "I don't want to be social"; "I'm going through the motions" 6. SUPPORT: "Who is with you now?" "Who do you live with?" "Do you have family or friends who you can talk to?"      Currently at work; pt states she has the support of her mom, brother, her faith, lives alone  7. THERAPIST: "Do you have a counselor or therapist? Name?"     One will be assigned to her, not fast enough 8. 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 ALCOHOL USE OR SUBSTANCE USE (DRUG USE): "Do you drink alcohol or use any illegal drugs?"     No -- "my addiction is eating", states she eats too much and lots of it  10. OTHER: "Do you have any other physical symptoms right now?" (e.g., fever)  Diarrhea and constipation, headache, dizziness, "I just feel like I am wound up"  Started in 06-May-2023; dad died in 08/05/2020 from COVID  Diarrhea and constipation - abdominal pain yesterday  Protocols used: Depression-A-AH

## 2023-07-05 ENCOUNTER — Ambulatory Visit: Admitting: Internal Medicine

## 2023-07-05 ENCOUNTER — Encounter: Payer: Self-pay | Admitting: Internal Medicine

## 2023-07-05 VITALS — BP 140/82 | HR 97 | Temp 98.6°F | Ht 65.0 in | Wt 215.0 lb

## 2023-07-05 DIAGNOSIS — F419 Anxiety disorder, unspecified: Secondary | ICD-10-CM | POA: Diagnosis not present

## 2023-07-05 DIAGNOSIS — F32A Depression, unspecified: Secondary | ICD-10-CM | POA: Diagnosis not present

## 2023-07-05 MED ORDER — VENLAFAXINE HCL ER 37.5 MG PO CP24: 37.5000 mg | ORAL_CAPSULE | Freq: Every day | ORAL | 5 refills | Status: DC

## 2023-07-05 MED ORDER — ALPRAZOLAM 0.5 MG PO TABS: 0.5000 mg | ORAL_TABLET | Freq: Two times a day (BID) | ORAL | 0 refills | Status: AC | PRN

## 2023-07-05 NOTE — Progress Notes (Signed)
 Subjective:    Patient ID: Rhonda [name], female    DOB: 16-Feb-1970, 53 y.o.   MRN: [id number]      HPI Rhonda [name] is here for  Chief Complaint  Patient presents with   Anxiety    Anxiety, depression and stress: related to work   Works at [employer]   She is here today because she is having significant anxiety, stress and depression.  She has never felt this way.  She was working at home and doing fine, but since needing to go back to the office every day for stress has been really increased.  She has difficulty concentrating at the office.  She feels depressed.  Her work has plummeted.  Her supervisor met with her today and noted that the last 2 months were very poor and she is concerned about losing her job.  Her father died 3 years ago and she has not dealt with that.  Her brother was diagnosed with stage IV cancer last year.  She is concerned about losing her mother.  She feels very lonely.   She is experiencing headaches, Decreased appetite, lightheadedness/dizziness, chest pain at times, palpitations.    Called [facility] - they are looking for someone for her to talk to.    Medications and allergies reviewed with patient and updated if appropriate.  Current Outpatient Medications on File Prior to Visit  Medication Sig Dispense Refill   acetaminophen  (TYLENOL ) 325 MG tablet Take 325 mg by mouth every 4 (four) hours as needed for fever.     esomeprazole  (NEXIUM ) 40 MG capsule Take 1 capsule (40 mg total) by mouth daily at 12 noon. Can increase to twice a day if needed     FeFum-FePoly-FA-B Cmp-C-Biot (INTEGRA PLUS ) CAPS 1 capsule p.o. every other day with vitamin C or orange juice 30 capsule 3   fluticasone  (FLONASE ) 50 MCG/ACT nasal spray Place 2 sprays into both nostrils daily. 16 g 6   potassium chloride  SA (KLOR-CON  M) 20 MEQ tablet TAKE 1 TABLET(20 MEQ) BY MOUTH TWICE DAILY (Patient not taking: Reported on 07/05/2023) 180 tablet 0   No current facility-administered medications on  file prior to visit.    Review of Systems  Constitutional:  Positive for appetite change.  Cardiovascular:  Positive for chest pain and palpitations.  Neurological:  Positive for dizziness, light-headedness and headaches.  Psychiatric/Behavioral:  Positive for decreased concentration (since being back in office), dysphoric mood and sleep disturbance (acute on chronic). The patient is nervous/anxious.        Objective:   Vitals:   07/05/23 1536  BP: (!) 140/82  Pulse: 97  Temp: 98.6 F (37 C)  SpO2: 99%   BP Readings from Last 3 Encounters:  07/05/23 (!) 140/82  06/14/23 116/64  05/30/23 116/72   Wt Readings from Last 3 Encounters:  07/05/23 215 lb (97.5 kg)  06/14/23 213 lb 6 oz (96.8 kg)  05/30/23 210 lb 9.6 oz (95.5 kg)   Body mass index is 35.78 kg/m.    Physical Exam Constitutional:      General: She is not in acute distress.    Appearance: Normal appearance. She is not ill-appearing.  HENT:     Head: Normocephalic and atraumatic.  Skin:    General: Skin is warm and dry.  Neurological:     Mental Status: She is alert.  Psychiatric:     Comments: Mood is anxious and depressed.  She is crying intermittently throughout the visit  Assessment & Plan:    Encounter Diagnosis  Name Primary?   Anxiety and depression Yes   Acute She is experiencing increased stress, anxiety and depression Her symptoms are secondary to multiple things-the death of her father 3 years ago, her brother being diagnosed with stage IV cancer last year, stressed with work-not been able to concentrate and work well since being back in the office She is experiencing worsening of her chronic insomnia, headaches, lightheadedness, dizziness, chest pain, palpitations, decreased appetite She will follow through with [facility] and start therapy if she is able to Referral ordered for psychiatry and therapy at behavioral health Start Effexor 37.5 mg daily Start Xanax  0.5 mg twice  daily as needed-advised this would be temporary.  Discussed that it is addicting  Follow-up in 3 weeks with PCP, sooner if needed

## 2023-07-05 NOTE — Patient Instructions (Addendum)
      Medications changes include :   start venlafaxine 37.5 mg daily - take this with breakfast   Referral was ordered for a therapist and psychiatrist.   Return in about 3 weeks (around 07/26/2023) for follow up anxiety, depression with PCP.

## 2023-07-11 ENCOUNTER — Ambulatory Visit: Payer: Self-pay

## 2023-07-11 ENCOUNTER — Ambulatory Visit (INDEPENDENT_AMBULATORY_CARE_PROVIDER_SITE_OTHER): Admitting: Otolaryngology

## 2023-07-11 ENCOUNTER — Encounter: Payer: Self-pay | Admitting: Internal Medicine

## 2023-07-11 ENCOUNTER — Encounter (INDEPENDENT_AMBULATORY_CARE_PROVIDER_SITE_OTHER): Payer: Self-pay | Admitting: Otolaryngology

## 2023-07-11 ENCOUNTER — Ambulatory Visit (INDEPENDENT_AMBULATORY_CARE_PROVIDER_SITE_OTHER): Admitting: Audiology

## 2023-07-11 VITALS — BP 117/77 | HR 82

## 2023-07-11 DIAGNOSIS — H938X1 Other specified disorders of right ear: Secondary | ICD-10-CM

## 2023-07-11 DIAGNOSIS — H6991 Unspecified Eustachian tube disorder, right ear: Secondary | ICD-10-CM | POA: Diagnosis not present

## 2023-07-11 DIAGNOSIS — K219 Gastro-esophageal reflux disease without esophagitis: Secondary | ICD-10-CM

## 2023-07-11 DIAGNOSIS — R490 Dysphonia: Secondary | ICD-10-CM | POA: Diagnosis not present

## 2023-07-11 DIAGNOSIS — H9041 Sensorineural hearing loss, unilateral, right ear, with unrestricted hearing on the contralateral side: Secondary | ICD-10-CM

## 2023-07-11 DIAGNOSIS — R0982 Postnasal drip: Secondary | ICD-10-CM

## 2023-07-11 DIAGNOSIS — R09A2 Foreign body sensation, throat: Secondary | ICD-10-CM

## 2023-07-11 NOTE — Progress Notes (Unsigned)
 Always with some fullness.  Still doing flonase  and reflux medication Nothing is helping.  GI planning EGD

## 2023-07-11 NOTE — Progress Notes (Signed)
  [address] [place], [place] [address] [phone]  Audiological Evaluation    Name: Rhonda [name]     DOB:   [birth year]      MRN:   [id number]                                                                                     Service Date: 07/11/2023     Accompanied by: unaccompanied   Patient comes today after Dr. Lydia Sams, ENT sent a referral for a hearing evaluation due to concerns with hearing loss.   Symptoms Yes Details  Hearing loss  [x]  Trouble hearing  Tinnitus  [x]  Ringing in both ears- longstanding  Ear pain/ infections/pressure  [x]  Feels right side pressure / fullness is worse Reports wind hurts in her ears ( natural wind or fan )  Balance problems  [x]  Some spinning after driving or when riding on an elevator  Noise exposure history  []    Previous ear surgeries  []    Family history of hearing loss  []    Amplification  []    Other  [x]  Chiari malformation    Otoscopy: Right ear: Clear external ear canal and notable landmarks visualized on the tympanic membrane. Left ear:  Clear external ear canal and notable landmarks visualized on the tympanic membrane.  Tympanometry: Right ear: Type A- Normal external ear canal volume with normal middle ear pressure and tympanic membrane compliance. Left ear: Type A- Normal external ear canal volume with normal middle ear pressure and tympanic membrane compliance.   Pure tone Audiometry: Right ear- Normal hearing from [phone] Hz, except for a mild sensorineural hearing loss  at 2000 Hz. Left ear-  Normal hearing from [phone] Hz  Speech Audiometry: Right ear- Speech Reception Threshold (SRT) was obtained at 20 dBHL. Left ear-Speech Reception Threshold (SRT) was obtained at 10 dBHL.   Word Recognition Score Tested using NU-6 (recorded) Right ear: 100% was obtained at a presentation level of 55 dBHL with contralateral masking which is deemed as  excellent. Left ear: 100% was obtained at a presentation level  of 55 dBHL with contralateral masking which is deemed as  excellent.   The hearing test results were completed under headphones and re-checked with inserts and results are deemed to be of good reliability. Test technique:  conventional      Recommendations: Follow up with ENT as scheduled for today., Return for a hearing evaluation if concerns with hearing changes arise or per MD recommendation.  Consider a communication needs assessment after medical clearance for amplification is obtained.   Rhonda [name] Rhonda [name], AUD

## 2023-07-11 NOTE — Telephone Encounter (Signed)
 FYI Only or Action Required?: FYI only for provider  Patient was last seen in primary care on 07/05/2023 by Colene Dauphin, MD. Called Nurse Triage reporting Neck Pain. Symptoms began several years ago. Interventions attempted: OTC medications: Tylenol . Symptoms are: gradually worsening.  Triage Disposition: See HCP Within 4 Hours (Or PCP Triage)  Patient/caregiver understands and will follow disposition?: Yes, but will wait  Copied from CRM [id number]. Topic: Clinical - Red Word Triage >> Jul 11, 2023  4:44 PM Chrystal Crape R wrote: Pt has back pain, and was calling to inquire about a referral for a neurologist however she has not spoken with Dr. Rochelle Chu about this new and worsening symptoms and will need an appointment. Reason for Disposition  [1] SEVERE neck pain (e.g., excruciating, unable to do any normal activities) AND [2] not improved after 2 hours of pain medicine  Answer Assessment - Initial Assessment Questions 1. ONSET: When did the pain begin?      Neck. Chiari malformation dx at neuro in 2019 2. LOCATION: Where does it hurt?      neck 3. PATTERN Does the pain come and go, or has it been constant since it started?      Constant 4. SEVERITY: How bad is the pain?  (Scale 1-10; or mild, moderate, severe)   - NO PAIN (0): no pain or only slight stiffness    - MILD (1-3): doesn't interfere with normal activities    - MODERATE (4-7): interferes with normal activities or awakens from sleep    - SEVERE (8-10):  excruciating pain, unable to do any normal activities      Severe 5. RADIATION: Does the pain go anywhere else, shoot into your arms?     Bilateral arms 6. CORD SYMPTOMS: Any weakness or numbness of the arms or legs?     Tingling bilateral hands-chronic 7. CAUSE: What do you think is causing the neck pain?     Chiari Malformation 8. NECK OVERUSE: Any recent activities that involved turning or twisting the neck?     No 9. OTHER SYMPTOMS: Do you have any other  symptoms? (e.g., headache, fever, chest pain, difficulty breathing, neck swelling)     Stiffness  Additional info: Requesting referral to neurologist.  Protocols used: Neck Pain or Stiffness-A-AH

## 2023-07-12 ENCOUNTER — Ambulatory Visit: Admitting: Internal Medicine

## 2023-07-12 ENCOUNTER — Encounter: Payer: Self-pay | Admitting: Internal Medicine

## 2023-07-12 ENCOUNTER — Ambulatory Visit (INDEPENDENT_AMBULATORY_CARE_PROVIDER_SITE_OTHER)

## 2023-07-12 ENCOUNTER — Ambulatory Visit: Payer: Self-pay | Admitting: Internal Medicine

## 2023-07-12 VITALS — BP 112/72 | HR 73 | Temp 97.6°F | Ht 65.0 in | Wt 210.4 lb

## 2023-07-12 DIAGNOSIS — M542 Cervicalgia: Secondary | ICD-10-CM

## 2023-07-12 DIAGNOSIS — E785 Hyperlipidemia, unspecified: Secondary | ICD-10-CM | POA: Diagnosis not present

## 2023-07-12 DIAGNOSIS — I950 Idiopathic hypotension: Secondary | ICD-10-CM | POA: Diagnosis not present

## 2023-07-12 DIAGNOSIS — R930 Abnormal findings on diagnostic imaging of skull and head, not elsewhere classified: Secondary | ICD-10-CM

## 2023-07-12 DIAGNOSIS — Q0701 Arnold-Chiari syndrome with spina bifida: Secondary | ICD-10-CM | POA: Diagnosis not present

## 2023-07-12 DIAGNOSIS — R739 Hyperglycemia, unspecified: Secondary | ICD-10-CM | POA: Diagnosis not present

## 2023-07-12 DIAGNOSIS — M436 Torticollis: Secondary | ICD-10-CM | POA: Diagnosis not present

## 2023-07-12 DIAGNOSIS — M4802 Spinal stenosis, cervical region: Secondary | ICD-10-CM | POA: Diagnosis not present

## 2023-07-12 DIAGNOSIS — I10 Essential (primary) hypertension: Secondary | ICD-10-CM | POA: Insufficient documentation

## 2023-07-12 LAB — LIPID PANEL
Cholesterol: 202 mg/dL — ABNORMAL HIGH (ref 0–200)
HDL: 65 mg/dL (ref >39.00)
LDL Cholesterol: 124 mg/dL — ABNORMAL HIGH (ref 0–99)
NonHDL: 137.26
Total CHOL/HDL Ratio: 3
Triglycerides: 64 mg/dL (ref 0.0–149.0)
VLDL: 12.8 mg/dL (ref 0.0–40.0)

## 2023-07-12 LAB — BASIC METABOLIC PANEL WITH GFR
BUN: 12 mg/dL (ref 6–23)
CO2: 30 meq/L (ref 19–32)
Calcium: 9.1 mg/dL (ref 8.4–10.5)
Chloride: 102 meq/L (ref 96–112)
Creatinine, Ser: 0.88 mg/dL (ref 0.40–1.20)
GFR: 74.82 mL/min (ref >60.00)
Glucose, Bld: 96 mg/dL (ref 70–99)
Potassium: 4.2 meq/L (ref 3.5–5.1)
Sodium: 138 meq/L (ref 135–145)

## 2023-07-12 LAB — TSH: TSH: 0.73 u[IU]/mL (ref 0.35–5.50)

## 2023-07-12 LAB — CORTISOL: Cortisol, Plasma: 4.6 ug/dL

## 2023-07-12 LAB — HEMOGLOBIN A1C: Hgb A1c MFr Bld: 5.5 % (ref 4.6–6.5)

## 2023-07-12 NOTE — Progress Notes (Unsigned)
 Subjective:  Patient ID: Rhonda [name], female    DOB: Jan 28, 1970  Age: 53 y.o. MRN: [id number]  CC: Acute Visit (Intermittent neck pain, denies radiating pains)   HPI Rhonda [name] presents for f/up ---  Discussed the use of AI scribe software for clinical note transcription with the patient, who gave verbal consent to proceed.  History of Present Illness   Rhonda [name] is a 53 year old female with Chiari malformation who presents with dizziness and neck pain.  Dizziness has been persistent for the past three months, significantly impacting her ability to work, especially in environments with bright lights. An ENT specialist identified slight hearing loss but no ear-related cause for the dizziness. She experienced a fall two weeks ago, which did not result in injury, but her dizziness has worsened since. She also experiences headaches and blurred vision.  Neck pain has been intermittent since the beginning of the year, most severe in the mornings, necessitating assistance to lift her neck off the bed. The pain does not radiate to her arms or legs, but there is discomfort in the crest of her back shoulders. She has not undergone any neck x-rays and manages the pain with Tylenol . She is under significant work-related stress, which she feels may contribute to her symptoms. She is not using any medications for pain other than Tylenol  and has not tried anti-inflammatories or muscle relaxers.       Outpatient Medications Prior to Visit  Medication Sig Dispense Refill   acetaminophen  (TYLENOL ) 325 MG tablet Take 325 mg by mouth every 4 (four) hours as needed for fever.     ALPRAZolam  (XANAX ) 0.5 MG tablet Take 1 tablet (0.5 mg total) by mouth 2 (two) times daily as needed for anxiety. 20 tablet 0   esomeprazole  (NEXIUM ) 40 MG capsule Take 1 capsule (40 mg total) by mouth daily at 12 noon. Can increase to twice a day if needed     FeFum-FePoly-FA-B Cmp-C-Biot (INTEGRA PLUS ) CAPS 1 capsule  p.o. every other day with vitamin C or orange juice 30 capsule 3   fluticasone  (FLONASE ) 50 MCG/ACT nasal spray Place 2 sprays into both nostrils daily. 16 g 6   potassium chloride  SA (KLOR-CON  M) 20 MEQ tablet TAKE 1 TABLET(20 MEQ) BY MOUTH TWICE DAILY 180 tablet 0   venlafaxine  XR (EFFEXOR  XR) 37.5 MG 24 hr capsule Take 1 capsule (37.5 mg total) by mouth daily with breakfast. 30 capsule 5   No facility-administered medications prior to visit.    ROS Review of Systems  Objective:  BP 112/72 (BP Location: Left Arm, Patient Position: Sitting)   Pulse 73   Temp 97.6 F (36.4 C) (Temporal)   Ht 5' 5 (1.651 m)   Wt 210 lb 6.4 oz (95.4 kg)   SpO2 96%   BMI 35.01 kg/m   BP Readings from Last 3 Encounters:  07/12/23 112/72  07/11/23 117/77  07/05/23 (!) 140/82    Wt Readings from Last 3 Encounters:  07/12/23 210 lb 6.4 oz (95.4 kg)  07/05/23 215 lb (97.5 kg)  06/14/23 213 lb 6 oz (96.8 kg)    Physical Exam  Cardiovascular:     Rate and Rhythm: Normal rate and regular rhythm.     Heart sounds: No murmur heard.    No friction rub. No gallop.     Comments: EKG- NSR, 72 bpm No LVH, Q waves, or ST/T wave changes  Pulmonary:     Effort: Pulmonary effort is normal.  Breath sounds: No stridor. No wheezing, rhonchi or rales.  Abdominal:     General: Abdomen is flat.     Palpations: There is no mass.     Tenderness: There is no abdominal tenderness. There is no guarding.     Hernia: No hernia is present.   Musculoskeletal:     Right lower leg: No edema.     Left lower leg: No edema.     Lab Results  Component Value Date   WBC 7.1 05/30/2023   HGB 13.7 05/30/2023   HCT 40.4 05/30/2023   PLT 396 05/30/2023   GLUCOSE 96 07/12/2023   CHOL 202 (H) 07/12/2023   TRIG 64.0 07/12/2023   HDL 65.00 07/12/2023   LDLCALC 124 (H) 07/12/2023   ALT 10 03/01/2023   AST 14 (L) 03/01/2023   NA 138 07/12/2023   K 4.2 07/12/2023   CL 102 07/12/2023   CREATININE 0.88 07/12/2023    BUN 12 07/12/2023   CO2 30 07/12/2023   TSH 0.73 07/12/2023   HGBA1C 5.5 07/12/2023    CT Soft Tissue Neck W Contrast Result Date: 12/19/2022 CLINICAL DATA:  Right ear pain and discomfort over the last 2 weeks. History of tonsillectomy. Neck mass. EXAM: CT NECK WITH CONTRAST TECHNIQUE: Multidetector CT imaging of the neck was performed using the standard protocol following the bolus administration of intravenous contrast. RADIATION DOSE REDUCTION: This exam was performed according to the departmental dose-optimization program which includes automated exposure control, adjustment of the mA and/or kV according to patient size and/or use of iterative reconstruction technique. CONTRAST:  75mL ISOVUE -300 IOPAMIDOL  (ISOVUE -300) INJECTION 61% COMPARISON:  MRI 10/08/2022 FINDINGS: Pharynx and larynx: No mucosal or submucosal lesion. Salivary glands: Parotid and submandibular glands are normal. No evidence of parotid mass or inflammation. Thyroid : Normal Lymph nodes: No lymphadenopathy on either side of the neck. Normal bilateral cervical nodes. Vascular: No abnormal vascular finding. Limited intracranial: Normal Visualized orbits: Normal Mastoids and visualized paranasal sinuses: Clear sinuses. No fluid seen at either mastoid tip. Middle ears not included in the region studied. Skeleton: Normal Upper chest: Lung apices are clear. Other: None IMPRESSION: Normal CT scan of the neck. No abnormality seen to explain the clinical presentation. No evidence of parotid mass or inflammation. No lymphadenopathy. Mastoid tips are clear. Electronically Signed   By: Bettylou Brunner M.D.   On: 12/19/2022 16:24    Assessment & Plan:  Chiari malformation type II (HCC) -     Ambulatory referral to Neurology  Neck pain, bilateral posterior -     DG Cervical Spine Complete; Future  Abnormal MRI of head -     Ambulatory referral to Neurology  Hypertension, unspecified type -     EKG 12-Lead  Idiopathic hypotension -      Basic metabolic panel with GFR; Future -     TSH; Future -     Cortisol; Future  Dyslipidemia, goal LDL below 130 -     Lipid panel; Future  Hyperglycemia -     Basic metabolic panel with GFR; Future -     Hemoglobin A1c; Future     Follow-up: Return in about 3 months (around 10/12/2023).  Sandra Crouch, MD

## 2023-07-12 NOTE — Patient Instructions (Signed)
Place neck pain patient instructions here.  

## 2023-07-14 ENCOUNTER — Encounter: Payer: Self-pay | Admitting: Audiology

## 2023-07-20 ENCOUNTER — Encounter: Payer: Self-pay | Admitting: Internal Medicine

## 2023-07-26 ENCOUNTER — Ambulatory Visit: Admitting: Internal Medicine

## 2023-08-03 ENCOUNTER — Ambulatory Visit: Admitting: Licensed Clinical Social Worker

## 2023-08-03 ENCOUNTER — Other Ambulatory Visit (HOSPITAL_BASED_OUTPATIENT_CLINIC_OR_DEPARTMENT_OTHER): Payer: Self-pay

## 2023-08-03 ENCOUNTER — Other Ambulatory Visit: Payer: Self-pay | Admitting: Internal Medicine

## 2023-08-03 DIAGNOSIS — F411 Generalized anxiety disorder: Secondary | ICD-10-CM

## 2023-08-03 DIAGNOSIS — K219 Gastro-esophageal reflux disease without esophagitis: Secondary | ICD-10-CM

## 2023-08-03 DIAGNOSIS — F329 Major depressive disorder, single episode, unspecified: Secondary | ICD-10-CM | POA: Insufficient documentation

## 2023-08-03 DIAGNOSIS — F32A Depression, unspecified: Secondary | ICD-10-CM
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
## 2023-08-07 ENCOUNTER — Other Ambulatory Visit (HOSPITAL_BASED_OUTPATIENT_CLINIC_OR_DEPARTMENT_OTHER): Payer: Self-pay

## 2023-08-07 ENCOUNTER — Other Ambulatory Visit: Payer: Self-pay | Admitting: Internal Medicine

## 2023-08-07 DIAGNOSIS — K219 Gastro-esophageal reflux disease without esophagitis: Secondary | ICD-10-CM

## 2023-08-07 MED ORDER — ESOMEPRAZOLE MAGNESIUM 40 MG PO CPDR
40.0000 mg | DELAYED_RELEASE_CAPSULE | Freq: Every day | ORAL | 0 refills | Status: DC
  Filled 2023-08-07: qty 90, 90d supply, fill #0

## 2023-08-07 NOTE — Telephone Encounter (Signed)
 Copied from CRM [id number]. Topic: Clinical - Medication Refill >> Aug 07, 2023  3:44 PM Macario HERO wrote: Medication: esomeprazole  (NEXIUM ) 40 MG capsule [[id number]]  Has the patient contacted their pharmacy? Yes (Agent: If no, request that the patient contact the pharmacy for the refill. If patient does not wish to contact the pharmacy document the reason why and proceed with request.) (Agent: If yes, when and what did the pharmacy advise?)  This is the patient's preferred pharmacy:    [facility] [address] [place] [address] Phone: [phone] Fax: [phone]    Is this the correct pharmacy for this prescription? Yes If no, delete pharmacy and type the correct one.   Has the prescription been filled recently? Yes  Is the patient out of the medication? Yes  Has the patient been seen for an appointment in the last year OR does the patient have an upcoming appointment? Yes  Can we respond through MyChart? Yes  Agent: Please be advised that Rx refills may take up to 3 business days. We ask that you follow-up with your pharmacy.

## 2023-08-08 ENCOUNTER — Other Ambulatory Visit (HOSPITAL_BASED_OUTPATIENT_CLINIC_OR_DEPARTMENT_OTHER): Payer: Self-pay

## 2023-08-10 ENCOUNTER — Other Ambulatory Visit (HOSPITAL_BASED_OUTPATIENT_CLINIC_OR_DEPARTMENT_OTHER): Payer: Self-pay

## 2023-08-11 ENCOUNTER — Encounter: Payer: Self-pay | Admitting: Neurology

## 2023-08-11 ENCOUNTER — Ambulatory Visit: Admitting: Neurology

## 2023-08-11 VITALS — BP 102/64 | HR 82 | Ht 65.0 in | Wt 214.0 lb

## 2023-08-11 DIAGNOSIS — G935 Compression of brain: Secondary | ICD-10-CM | POA: Diagnosis not present

## 2023-08-11 DIAGNOSIS — G43009 Migraine without aura, not intractable, without status migrainosus: Secondary | ICD-10-CM | POA: Diagnosis not present

## 2023-08-11 DIAGNOSIS — R519 Headache, unspecified: Secondary | ICD-10-CM

## 2023-08-11 MED ORDER — TOPIRAMATE 25 MG PO TABS: 25.0000 mg | ORAL_TABLET | Freq: Two times a day (BID) | ORAL | 6 refills | Status: DC

## 2023-08-11 MED ORDER — TOPIRAMATE 25 MG PO TABS: 25.0000 mg | ORAL_TABLET | Freq: Every day | ORAL | 6 refills | Status: DC

## 2023-08-11 NOTE — Patient Instructions (Addendum)
 Neurosurgery for flow studies for chiari Write an accommodation letter  Start topiramate 25mg  at bedtme and can increase or change to a different medication  Topiramate Tablets What is this medication? TOPIRAMATE (toe PYRE a mate) prevents and controls seizures in people with epilepsy. It may also be used to prevent migraine headaches. It works by calming overactive nerves in your body. This medicine may be used for other purposes; ask your health care provider or pharmacist if you have questions. COMMON BRAND NAME(S): Topamax, Topiragen What should I tell my care team before I take this medication? They need to know if you have any of these conditions: Bleeding disorder Kidney disease Lung disease Suicidal thoughts, plans, or attempt by you or a family member An unusual or allergic reaction to topiramate, other medications, foods, dyes, or preservatives Pregnant or trying to get pregnant Breast-feeding How should I use this medication? Take this medication by mouth with water. Take it as directed on the prescription label at the same time every day. Do not cut, crush or chew this medicine. Swallow the tablets whole. You can take it with or without food. If it upsets your stomach, take it with food. Keep taking it unless your care team tells you to stop. A special MedGuide will be given to you by the pharmacist with each prescription and refill. Be sure to read this information carefully each time. Talk to your care team about the use of this medication in children. While it may be prescribed for children as young as 2 years for selected conditions, precautions do apply. Overdosage: If you think you have taken too much of this medicine contact a poison control center or emergency room at once. NOTE: This medicine is only for you. Do not share this medicine with others. What if I miss a dose? If you miss a dose, take it as soon as you can unless it is within 6 hours of the next dose. If it is  within 6 hours of the next dose, skip the missed dose. Take the next dose at the normal time. Do not take double or extra doses. What may interact with this medication? Acetazolamide Alcohol Antihistamines for allergy, cough, and cold Aspirin and aspirin-like medications Atropine Certain medications for anxiety or sleep Certain medications for bladder problems, such as oxybutynin, tolterodine Certain medications for depression, such as amitriptyline, fluoxetine, sertraline Certain medications for Parkinson disease, such as benztropine, trihexyphenidyl Certain medications for seizures, such as carbamazepine, lamotrigine, phenobarbital, phenytoin, primidone, valproic acid, zonisamide Certain medications for stomach problems, such as dicyclomine, hyoscyamine  Certain medications for travel sickness, such as scopolamine Certain medications that treat or prevent blood clots, such as warfarin, enoxaparin, dalteparin, apixaban, dabigatran, rivaroxaban Digoxin Diltiazem Estrogen and progestin hormones General anesthetics, such as halothane, isoflurane, methoxyflurane, propofol  Glyburide Hydrochlorothiazide Ipratropium Lithium Medications that relax muscles Metformin NSAIDs, medications for pain and inflammation, such as ibuprofen or naproxen Opioid medications for pain Phenothiazines, such as chlorpromazine, mesoridazine, prochlorperazine, thioridazine Pioglitazone This list may not describe all possible interactions. Give your health care provider a list of all the medicines, herbs, non-prescription drugs, or dietary supplements you use. Also tell them if you smoke, drink alcohol, or use illegal drugs. Some items may interact with your medicine. What should I watch for while using this medication? Visit your care team for regular checks on your progress. Tell your care team if your symptoms do not start to get better or if they get worse. Do not suddenly stop taking this medication. You  may  develop a severe reaction. Your care team will tell you how much medication to take. If your care team wants you to stop the medication, the dose may be slowly lowered over time to avoid any side effects. Wear a medical ID bracelet or chain. Carry a card that describes your condition. List the medications and doses you take on the card. This medication may affect your coordination, reaction time, or judgment. Do not drive or operate machinery until you know how this medication affects you. Sit up or stand slowly to reduce the risk of dizzy or fainting spells. Drinking alcohol with this medication can increase the risk of these side effects. This medication may cause serious skin reactions. They can happen weeks to months after starting the medication. Contact your care team right away if you notice fevers or flu-like symptoms with a rash. The rash may be red or purple and then turn into blisters or peeling of the skin. You may also notice a red rash with swelling of the face, lips, or lymph nodes in your neck or under your arms. This medication may cause thoughts of suicide or depression. This includes sudden changes in mood, behaviors, or thoughts. These changes can happen at any time but are more common in the beginning of treatment or after a change in dose. Call your care team right away if you experience these thoughts or worsening depression. This medication may slow your child's growth if it is taken for a long time at high doses. Your child's care team will monitor your child's growth. Using this medication for a long time may weaken your bones. The risk of bone fractures may be increased. Talk to your care team about your bone health. Discuss this medication with your care team if you may be pregnant. Serious birth defects can occur if you take this medication during pregnancy. There are benefits and risks to taking medications during pregnancy. Your care team can help you find the option that works  for you. Contraception is recommended while taking this medication. Estrogen and progestin hormones may not work as well while you are taking this medication. Your care team can help you find the option that works for you. Talk to your care team before breastfeeding. Changes to your treatment plan may be needed. What side effects may I notice from receiving this medication? Side effects that you should report to your care team as soon as possible: Allergic reactions--skin rash, itching, hives, swelling of the face, lips, tongue, or throat High acid level--trouble breathing, unusual weakness or fatigue, confusion, headache, fast or irregular heartbeat, nausea, vomiting High ammonia level--unusual weakness or fatigue, confusion, loss of appetite, nausea, vomiting, seizures Fever that does not go away, decrease in sweat Kidney stones--blood in the urine, pain or trouble passing urine, pain in the lower back or sides Redness, blistering, peeling or loosening of the skin, including inside the mouth Sudden eye pain or change in vision such as blurry vision, seeing halos around lights, vision loss Thoughts of suicide or self-harm, worsening mood, feelings of depression Side effects that usually do not require medical attention (report to your care team if they continue or are bothersome): Burning or tingling sensation in hands or feet Difficulty with paying attention, memory, or speech Dizziness Drowsiness Fatigue Loss of appetite with weight loss Slow or sluggish movements of the body This list may not describe all possible side effects. Call your doctor for medical advice about side effects. You may report side effects  to FDA at 1-800-FDA-1088. Where should I keep my medication? Keep out of the reach of children and pets. Store between 15 and 30 degrees C (59 and 86 degrees F). Protect from moisture. Keep the container tightly closed. Get rid of any unused medication after the expiration date. To  get rid of medications that are no longer needed or have expired: Take the medication to a medication take-back program. Check with your pharmacy or law enforcement to find a location. If you cannot return the medication, check the label or package insert to see if the medication should be thrown out in the garbage or flushed down the toilet. If you are not sure, ask your care team. If it is safe to put it in the trash, empty the medication out of the container. Mix the medication with cat litter, dirt, coffee grounds, or other unwanted substance. Seal the mixture in a bag or container. Put it in the trash. NOTE: This sheet is a summary. It may not cover all possible information. If you have questions about this medicine, talk to your doctor, pharmacist, or health care provider.  2024 Elsevier/Gold Standard (2021-06-03 00:00:00)

## 2023-08-11 NOTE — Progress Notes (Signed)
 [facility]    Provider:  Dr Ines Requesting Provider: Joshua Debby CROME, MD Primary Care Provider:  Joshua Debby CROME, MD  CC:  headaches  HPI:  Rhonda [name] is a 53 y.o. female here as requested by Joshua Debby CROME, MD for dizziness and headaches. has Vitamin B 12 deficiency; CARPAL TUNNEL SYNDROME; Allergic rhinitis; DE QUERVAIN'S TENOSYNOVITIS; Routine general medical examination at a health care facility; Gastroesophageal reflux disease without esophagitis; IBS (irritable bowel syndrome); Abnormal MRI of head; Chiari malformation type II (HCC); Screen for colon cancer; Psychophysiological insomnia; Primary osteoarthritis of both knees; Eczema; Obesity (BMI 35.0-39.9 without comorbidity); Vitamin D  deficiency; Flu vaccine need; Low back pain; Laryngitis, chronic; Neck pain, bilateral posterior; Hypertension; Idiopathic hypotension; Dyslipidemia, goal LDL below 130; Hyperglycemia; Major depressive disorder with single episode; Generalized anxiety disorder; Migraine without aura and without status migrainosus, not intractable; and Chiari malformation type I (HCC) on their problem list.  She is a Administrator, [employer] and she is back in the office and that is when the headaches started worsening and cannot adjust and she has terrible anxiety, its cold, she just wants to go back home, she wants to cry all the time, she is depressed, she has worked from home for 2 years and since going back she is in a wide open space(not an office anymore) and she drives 44 miles to get there. She has headaches, some days are worse than others, She is worried about being laid off due to the current administration. 16 headache days a month, laying down and napping helps. She feels like she has pressure in her head, frontal rea from temple to temple, heavy, the left side of her head feels numb and she has heavines radiating to the back of the neck, but she has been sitting on her phone for a while. No knon hx  of headaches/migraines in the family. She has woken up with a headache, tylenol  helps. She is worrying a lot. The lights bother her, her job lights are super sonic and burning her eyes. No nausea. Also sensitive to sounds too much talking.  Her neck hurts when she has to strain very hard but not when she sneezes or with regular owel movements only when constipated. She reports neck pain. She fell 2 months ago and lost her balance and she has lost her balance. She states she got up and was dizzy and the room was spinning, she gets dizzy when she stands up or if she turns her head quickly.    Reviewed notes, labs and imaging from outside physicians, which showed   From a thorough review of records and patient report, Medications tried that can be used in migraine/headache management greater than 3 months include: Lifestyle modification, headache diaries, better sleep hygiene, exercise, management of migraine triggers, OTC and prescribed analgesics/nsaids such as ibuprofen, excedrin, alleve and others, Aimovig contraindicated due to constipation, nortriptyline , effexor , blood pressure medications contraindicated due to hypotension(verapamil, propranolol etc),   MRI brain: reviewed images and agree: MRI HEAD WITHOUT CONTRAST   TECHNIQUE: Multiplanar, multiecho pulse sequences of the brain and surrounding structures were obtained without intravenous contrast.   COMPARISON:  Head MRI 10/05/2016   FINDINGS: Brain: There is extensive magnetic susceptibility artifact from orthodontic braces. This is particularly notable on diffusion weighted and susceptibility weighted imaging where large portions of the posterior fossa and frontal and temporal lobes are obscured, limiting assessment for acute infarct and hemorrhage. Within this limitation, no acute infarct, intracranial hemorrhage, mass, midline  shift, or extra-axial fluid collection is identified. Scattered small T2 hyperintensities in the cerebral  white matter are stable to minimally increased. The ventricles are normal in size. The cerebellar tonsils extend 5 mm below the foramen magnum, unchanged and with a normal rounded morphology.   Vascular: Major intracranial vascular flow voids are preserved.   Skull and upper cervical spine: Unremarkable bone marrow signal. No syrinx in the included upper cervical spinal cord.   Sinuses/Orbits: Grossly unremarkable orbits. The paranasal sinuses are largely obscured. Clear mastoid air cells.   Other: None.   IMPRESSION: 1. No acute intracranial abnormality within limitations of artifact from braces. 2. Unchanged 5 mm cerebellar tonsillar ectopia/borderline Chiari I malformation. 3. Mild cerebral white matter T2 signal changes, nonspecific though may reflect early chronic small vessel ischemia, migraines, or prior infection/inflammation.  Reviewed labs below, all unremarkable    Recent Results (from the past 2160 hours)  Iron and Iron Binding Capacity ([facility] only)     Status: None   Collection Time: 05/30/23  3:15 PM  Result Value Ref Range   Iron 44 28 - 170 ug/dL   TIBC 643 749 - 549 ug/dL   Saturation Ratios 12 10.4 - 31.8 %   UIBC 312 148 - 442 ug/dL    Comment: Performed at [facility], [address] [place], [place] [address]  CBC with Differential (Cancer Center Only)     Status: None   Collection Time: 05/30/23  3:15 PM  Result Value Ref Range   WBC Count 7.1 4.0 - 10.5 K/uL   RBC 4.56 3.87 - 5.11 MIL/uL   Hemoglobin 13.7 12.0 - 15.0 g/dL   HCT 59.5 63.9 - 53.9 %   MCV 88.6 80.0 - 100.0 fL   MCH 30.0 26.0 - 34.0 pg   MCHC 33.9 30.0 - 36.0 g/dL   RDW 86.7 88.4 - 84.4 %   Platelet Count 396 150 - 400 K/uL   nRBC 0.0 0.0 - 0.2 %   Neutrophils Relative % 53 %   Neutro Abs 3.8 1.7 - 7.7 K/uL   Lymphocytes Relative 36 %   Lymphs Abs 2.5 0.7 - 4.0 K/uL   Monocytes Relative 8 %   Monocytes Absolute 0.6 0.1 - 1.0 K/uL   Eosinophils  Relative 2 %   Eosinophils Absolute 0.2 0.0 - 0.5 K/uL   Basophils Relative 1 %   Basophils Absolute 0.1 0.0 - 0.1 K/uL   Immature Granulocytes 0 %   Abs Immature Granulocytes 0.03 0.00 - 0.07 K/uL    Comment: Performed at [facility], [address] [place], [place] [address]  Ferritin     Status: None   Collection Time: 05/30/23  3:16 PM  Result Value Ref Range   Ferritin 57 11 - 307 ng/mL    Comment: Performed at [facility], [address] [place], [place] [address]  Lipid panel     Status: Abnormal   Collection Time: 07/12/23 10:22 AM  Result Value Ref Range   Cholesterol 202 (H) 0 - 200 mg/dL    Comment: ATP III Classification       Desirable:  < 200 mg/dL               Borderline High:  200 - 239 mg/dL          High:  > = 759 mg/dL   Triglycerides 35.9 0.0 - 149.0 mg/dL    Comment: Normal:  <849 mg/dLBorderline High:  150 - 199 mg/dL   HDL 34.99 >60.99 mg/dL   VLDL 87.1 0.0 - 59.9 mg/dL   LDL Cholesterol 875 (H) 0 - 99 mg/dL   Total CHOL/HDL Ratio 3     Comment:                Men          Women1/2 Average Risk     3.4          3.3Average Risk          5.0          4.42X Average Risk          9.6          7.13X Average Risk          15.0          11.0                       NonHDL 137.26     Comment: NOTE:  Non-HDL goal should be 30 mg/dL higher than patient's LDL goal (i.e. LDL goal of < 70 mg/dL, would have non-HDL goal of < 100 mg/dL)  Cortisol     Status: None   Collection Time: 07/12/23 10:22 AM  Result Value Ref Range   Cortisol, Plasma 4.6 ug/dL    Comment: AM:  4.3 - 22.4 ug/dLPM:  3.1 - 16.7 ug/dL  Hemoglobin J8r     Status: None   Collection Time: 07/12/23 10:22 AM  Result Value Ref Range   Hgb A1c MFr Bld 5.5 4.6 - 6.5 %    Comment: Glycemic Control Guidelines for People with Diabetes:Non Diabetic:  <6%Goal of Therapy: <7%Additional Action Suggested:  >8%   TSH     Status: None   Collection Time: 07/12/23 10:22 AM   Result Value Ref Range   TSH 0.73 0.35 - 5.50 uIU/mL  Basic metabolic panel with GFR     Status: None   Collection Time: 07/12/23 10:22 AM  Result Value Ref Range   Sodium 138 135 - 145 mEq/L   Potassium 4.2 3.5 - 5.1 mEq/L   Chloride 102 96 - 112 mEq/L   CO2 30 19 - 32 mEq/L   Glucose, Bld 96 70 - 99 mg/dL   BUN 12 6 - 23 mg/dL   Creatinine, Ser 9.11 0.40 - 1.20 mg/dL   GFR 25.17 >39.99 mL/min    Comment: Calculated using the CKD-EPI Creatinine Equation (2021)   Calcium 9.1 8.4 - 10.5 mg/dL     Review of Systems: Patient complains of symptoms per HPI as well as the following symptoms per hpi. Pertinent negatives and positives per HPI. All others negative.   Social History   Socioeconomic History   Marital status: Single    Spouse name: Not on file   Number of children: 0   Years of education: Assoc   Highest education level: Not on file  Occupational History   Occupation: Office Support  Tobacco Use   Smoking status: Never   Smokeless tobacco: Never  Vaping Use   Vaping status: Never Used  Substance and Sexual Activity   Alcohol use: No   Drug use: No   Sexual activity: Not Currently  Other Topics Concern   Not on file  Social History Narrative   College grad   Single   Bought her own home in '09   Occupation: Doctor, general practice and cleaning   Fun: Resting, yard work  Denies religious beliefs effecting health care.    Drinks caffeine daily    Social Drivers of Corporate investment banker Strain: Not on file  Food Insecurity: Not on file  Transportation Needs: Not on file  Physical Activity: Not on file  Stress: Not on file  Social Connections: Not on file  Intimate Partner Violence: Not on file    Family History  Problem Relation Age of Onset   Hyperlipidemia Mother    Hypertension Mother    Heart disease Father    Hypertension Father    Diabetes Father    Diabetes Brother    Kidney cancer Brother 51   Diabetes Brother    Heart disease Maternal  Grandmother    Headache Neg Hx    Colon cancer Neg Hx    Prostate cancer Neg Hx    Rectal cancer Neg Hx    Stomach cancer Neg Hx    Esophageal cancer Neg Hx     Past Medical History:  Diagnosis Date   Allergic rhinitis    Allergy    B12 deficiency    Carpal tunnel syndrome    Dysfunction of eustachian tube    GERD (gastroesophageal reflux disease)     Patient Active Problem List   Diagnosis Date Noted   Chiari malformation type I (HCC) 08/14/2023   Migraine without aura and without status migrainosus, not intractable 08/11/2023   Major depressive disorder with single episode 08/03/2023   Generalized anxiety disorder 08/03/2023   Neck pain, bilateral posterior 07/12/2023   Hypertension 07/12/2023   Idiopathic hypotension 07/12/2023   Dyslipidemia, goal LDL below 130 07/12/2023   Hyperglycemia 07/12/2023   Laryngitis, chronic 03/20/2023   Low back pain 02/02/2023   Flu vaccine need 11/14/2022   Obesity (BMI 35.0-39.9 without comorbidity) 11/01/2021   Vitamin D  deficiency 11/01/2021   Eczema 07/20/2021   Primary osteoarthritis of both knees 02/08/2021   Screen for colon cancer 07/30/2019   Psychophysiological insomnia 07/30/2019   Chiari malformation type II (HCC) 09/27/2016   Abnormal MRI of head 03/07/2016   Gastroesophageal reflux disease without esophagitis 08/19/2015   IBS (irritable bowel syndrome) 08/19/2015   Routine general medical examination at a health care facility 07/14/2014   Vitamin B 12 deficiency 12/08/2008   CARPAL TUNNEL SYNDROME 05/14/2007   DE QUERVAIN'S TENOSYNOVITIS 05/14/2007   Allergic rhinitis 01/31/2007    Past Surgical History:  Procedure Laterality Date   BUNIONECTOMY     right    CHOLECYSTECTOMY N/A 09/25/2015   Procedure: LAPAROSCOPIC CHOLECYSTECTOMY WITH INTRAOPERATIVE CHOLANGIOGRAM;  Surgeon: Alm Angle, MD;  Location: [facility];  Service: General;  Laterality: N/A;   TONSILLECTOMY      Current Outpatient Medications  Medication  Sig Dispense Refill   acetaminophen  (TYLENOL ) 325 MG tablet Take 325 mg by mouth every 4 (four) hours as needed for fever.     ALPRAZolam  (XANAX ) 0.5 MG tablet Take 1 tablet (0.5 mg total) by mouth 2 (two) times daily as needed for anxiety. 20 tablet 0   esomeprazole  (NEXIUM ) 40 MG capsule Take 1 capsule (40 mg total) by mouth daily at 12 noon. Can increase to twice a day if needed 90 capsule 0   OIL OF OREGANO PO Take 6,000 mg by mouth daily.     venlafaxine  XR (EFFEXOR  XR) 37.5 MG 24 hr capsule Take 1 capsule (37.5 mg total) by mouth daily with breakfast. 30 capsule 5   topiramate  (TOPAMAX ) 25 MG tablet Take 1 tablet (25 mg total) by mouth  at bedtime. 30 tablet 6   No current facility-administered medications for this visit.    Allergies as of 08/11/2023 - Review Complete 08/11/2023  Allergen Reaction Noted   Bactrim  ds [sulfamethoxazole -trimethoprim ] Other (See Comments) 02/06/2017   Cephalosporins  02/03/2017   Oseltamivir phosphate Swelling 01/05/2009   Amoxicillin  Hives and Rash 12/28/2010   Fluconazole  Rash 07/30/2019    Vitals: BP 102/64 (BP Location: Left Arm, Patient Position: Sitting, Cuff Size: Large)   Pulse 82   Ht 5' 5 (1.651 m)   Wt 214 lb (97.1 kg)   LMP 06/05/2023   BMI 35.61 kg/m  Last Weight:  Wt Readings from Last 1 Encounters:  08/11/23 214 lb (97.1 kg)   Last Height:   Ht Readings from Last 1 Encounters:  08/11/23 5' 5 (1.651 m)     Physical exam: Exam: Gen: NAD, conversant, well nourised, obese, well groomed                     CV: RRR, no MRG. No Carotid Bruits. No peripheral edema, warm, nontender Eyes: Conjunctivae clear without exudates or hemorrhage  Neuro: Detailed Neurologic Exam  Speech:    Speech is normal; fluent and spontaneous with normal comprehension.  Cognition:    The patient is oriented to person, place, and time;     recent and remote memory intact;     language fluent;     normal attention, concentration,     fund of  knowledge Cranial Nerves:    The pupils are equal, round, and reactive to light. The fundi are normal and spontaneous venous pulsations are present. Visual fields are full to finger confrontation. Extraocular movements are intact. Trigeminal sensation is intact and the muscles of mastication are normal. The face is symmetric. The palate elevates in the midline. Hearing intact. Voice is normal. Shoulder shrug is normal. The tongue has normal motion without fasciculations.   Coordination: NML  Gait: NML  Motor Observation:    No asymmetry, no atrophy, and no involuntary movements noted. Tone:    Normal muscle tone.    Posture:    Posture is normal. normal erect    Strength:    Strength is V/V in the upper and lower limbs.      Sensation: intact to LT     Reflex Exam:  DTR's:    Deep tendon reflexes in the upper and lower extremities are symmetricl bilaterally.   Toes:    The toes are downgoing bilaterally.   Clonus:    Clonus is absent.    Assessment/Plan:  Patient with headaches and dizziness. I believe her chiari is asymptomatic but we can send to neurosurgery to see if they can perform flow studies/evaluate since she is concerned about it. However her headaches are likely stress related.  - I suspect her reported depression and anxiety are highly correlated to her headache which coincided with a stressful situation going back to work 2 months ago. More tension type but cannot rule out migrainous with exacerbation from office lights/sound or chiari. however symptoms coincided with return to office 2 months ago - the chiari is small 5mm, rounded, doesn't appear to provide any mass effect on the brainstem, no changes from 2018. I believe her chiari is asymptomatic but we can send to neurosurgery to see if they can perform flow studies/evaluate since she is concerned about it doubt she needs any decompression but will get my colleague's opinion in neurosurgery. - she reports blurred  vision but has  not gone to the eye doctor, recommend  - Write a letter for accommodations asking for noise and light tampering and if not working from home is a reasonable accommodation - Send to neurosurgery for flow studies however she has had stable chiari for years and her headaches coincided with stress - she states dizziness but it sounds more orthostatic when standing out of bed or getting out of car and her blood pressure is low; conservative measures and fall prevention - she does report neck pain but no significant pain on cough or valsalva or straining except when very constipated.Again I feel her chiari is not symptomatic but she wants it evaluated, if neurosurgery cannot perform flow studies or feel this is not an appropriate referral then we can monitor. - start migraine prevention - wrote letter as below  To Whom It May Concern:  I am writing to request a reasonable accommodation under the Americans with Disabilities Act (ADA.)  Patient reports that her headaches triggered by the fluorescent lighting and noise in the office. These symptoms make it difficult to concentrate on tasks, meet deadlines, and communicate effectively with colleagues.Accommodations include:  Lighting: Migraines can be triggered by bright or flickering lights. Accommodations include: Using natural light when possible. Adjusting or dimming overhead lighting. Providing desk lamps with adjustable lighting. Using anti-glare filters for computer screens. Offering liquid crystal display (LCD) monitors. Noise: Loud or distracting noises can also trigger migraines. Consider: Providing noise-canceling headphones. Offering a quiet workspace away from high-traffic areas. Using sound absorption panels. Flexible Work Arrangements: Risk manager employees to adjust their work hours or start/end times can help them avoid triggering situations or better manage symptoms as they arise.  Telecommuting/Work  from Home:Providing the option to work from home can allow employees to manage migraines in a more comfortable and controlled environment.  Breaks:Enabling employees to take frequent, short breaks to rest, hydrate, or take medication can be beneficial.   Thank you for your time and consideration.   [name]    Orders Placed This Encounter  Procedures   Ambulatory referral to Neurosurgery   Meds ordered this encounter  Medications   DISCONTD: topiramate  (TOPAMAX ) 25 MG tablet    Sig: Take 1 tablet (25 mg total) by mouth 2 (two) times daily.    Dispense:  30 tablet    Refill:  6   topiramate  (TOPAMAX ) 25 MG tablet    Sig: Take 1 tablet (25 mg total) by mouth at bedtime.    Dispense:  30 tablet    Refill:  6    Cancel all prior topiramate  prescriptions    Cc: Joshua Debby CROME, MD,  Joshua Debby CROME, MD  [name], MD  [facility] [address] [place], [place] [address]  Phone [phone] Fax [phone]  I spent 46 minutes of face-to-face and non-face-to-face time with patient on the  1. Migraine without aura and without status migrainosus, not intractable   2. Acute nonintractable headache, unspecified headache type   3. Chiari malformation type I (HCC)    diagnosis.  This included previsit chart review, lab review, study review, order entry, electronic health record documentation, patient education on the different diagnostic and therapeutic options, counseling and coordination of care, risks and benefits of management, compliance, or risk factor reduction

## 2023-08-14 ENCOUNTER — Encounter: Payer: Self-pay | Admitting: Neurology

## 2023-08-14 DIAGNOSIS — G935 Compression of brain: Secondary | ICD-10-CM | POA: Insufficient documentation

## 2023-08-15 ENCOUNTER — Telehealth: Payer: Self-pay | Admitting: Neurology

## 2023-08-15 ENCOUNTER — Telehealth: Payer: Self-pay | Admitting: *Deleted

## 2023-08-15 ENCOUNTER — Encounter: Payer: Self-pay | Admitting: *Deleted

## 2023-08-15 NOTE — Telephone Encounter (Signed)
 Letter done.  Placed in box for signature.

## 2023-08-15 NOTE — Telephone Encounter (Signed)
 Referral for neurosurgery sent through EPIC to [facility] [place]. Phone: [phone], Fax: [phone]

## 2023-08-15 NOTE — Telephone Encounter (Signed)
-----   Message from [name] sent at 08/14/2023  6:53 PM EDT ----- Regarding: please compose letter Can you please put this in a letter for me to sign? Thank you  To Whom It May Concern:  I am writing to request a reasonable accommodation under the Americans with Disabilities Act (ADA.)  Patient reports that her headaches triggered by the fluorescent lighting and noise in the office. These symptoms make it difficult to concentrate on tasks, meet deadlines, and communicate effectively with colleagues.Accommodations include:  Lighting:  Migraines can be triggered by bright or flickering lights. Accommodations include:  Using natural light when possible.  Adjusting or dimming overhead lighting.  Providing desk lamps with adjustable lighting.  Using anti-glare filters for computer screens.  Offering liquid crystal display (LCD) monitors. Noise: Loud or distracting noises can also trigger migraines. Consider:  Providing noise-canceling headphones.  Offering a quiet workspace away from high-traffic areas.  Using sound absorption panels. Flexible Work Arrangements:  Risk manager employees to adjust their work hours or start/end times can help them avoid triggering situations or better manage symptoms as they arise.   Telecommuting/Work from Home:Providing the option to work from home can allow employees to manage migraines in a more comfortable and controlled environment.   Breaks:Enabling employees to take frequent, short breaks to rest, hydrate, or take medication can be beneficial.   Thank you for your time and consideration.   [name]

## 2023-08-21 ENCOUNTER — Ambulatory Visit: Admitting: Licensed Clinical Social Worker

## 2023-08-21 DIAGNOSIS — F411 Generalized anxiety disorder: Secondary | ICD-10-CM | POA: Diagnosis not present

## 2023-08-21 DIAGNOSIS — F329 Major depressive disorder, single episode, unspecified: Secondary | ICD-10-CM | POA: Diagnosis not present

## 2023-08-21 NOTE — Progress Notes (Signed)
 [facility] Counselor/Therapist Progress Note  Patient ID: Rhonda [name], MRN: [id number]    Date: 08/21/23  Time Spent: 0357  pm - 0455 pm : 58 Minutes  Treatment Type: Individual Therapy.  Reported Symptoms: Patient reports having many life changes. Patient reports that her father passed 3 years ago from COVID. Her brother was diagnosed with Cancer. Patient is fearful of losing her job.   Mental Status Exam: Appearance:  Neat     Behavior: Appropriate  Motor: Normal  Speech/Language:  Clear and Coherent  Affect: Depressed  Mood: depressed  Thought process: normal  Thought content:   WNL  Sensory/Perceptual disturbances:   WNL  Orientation: oriented to person, place, time/date, situation, day of week, month of year, and year  Attention: Good  Concentration: Good  Memory: WNL  Fund of knowledge:  Good  Insight:   Good  Judgment:  Good  Impulse Control: Good   Risk Assessment: Danger to Self:  No Self-injurious Behavior: No Danger to Others: No Duty to Warn:no Physical Aggression / Violence:No  Access to Firearms a concern: No  Gang Involvement:No   Subjective:   Rhonda [name] participated I person from office, located at [employer]. Rhonda [name] consented to treatment. Therapist participated from office.   Rhonda [name] presented for her session in a depressed mood. Rhonda [name] reports that they are no longer allowing people to be accommodated at home and they are making everyone return to the office and will allow for accommodations in the office. Patient states that she remains stressed and depressed and lacks motivation at home. She reports that she can't deal with the noise at work and it is distracting. She reports that she is feeling as though she is just waiting for the worst to happen. Patient reports that she feels pressured and is concerned that she will not meet the expectations required of her.  Clinician actively listened and processed with patient her  concerns. Clinician processed with patient her stress and discussed the importance of focusing on her  work and not allowing herself to become engulfed in the fear of failure. Clinician encouraged patient to focus on her previous performance and to try not to dwell on the expectations and past performance concerns. Clinician and patient processed re framing thoughts and positive affirmations. Clinician encouraged patient to work on positivity.  Kandie was fully engaged in session discussion. Rhonda [name] was open and motivated for treatment. Rhonda [name] will utilize coping skills to assist with decreasing symptoms of depression and anxiety. Rhonda [name] will continue to engage in bi weekly therapy sessions. Treatment planing will be reviewed by 08/02/2024.  Interventions: Cognitive Behavioral Therapy, Dialectical Behavioral Therapy, Assertiveness/Communication, Motivational Interviewing, and Solution-Oriented/Positive Psychology  Diagnosis: Major Depressive Disorder with single episode remission status unspecified, Generalized Anxiety Disorder   Damien Junk MSW, LCSW/DATE 08/21/2023

## 2023-08-22 NOTE — Telephone Encounter (Signed)
 I called pt and she will pick up.  Placed in file up front.

## 2023-09-05 ENCOUNTER — Encounter: Payer: Self-pay | Admitting: Neurosurgery

## 2023-09-05 ENCOUNTER — Ambulatory Visit: Admitting: Neurosurgery

## 2023-09-05 VITALS — BP 123/89 | HR 82 | Ht 65.0 in | Wt 220.0 lb

## 2023-09-05 DIAGNOSIS — G935 Compression of brain: Secondary | ICD-10-CM | POA: Diagnosis not present

## 2023-09-05 NOTE — Progress Notes (Signed)
 Assessment : 53 year old lady with an insignificant past medical history who was doing very well and occasionally had headaches which she would remedy with Tylenol .  In January, she started having severe headaches which she has on a daily basis.  They are all over her head and she is light sensitive.  She was evaluated by Dr. Ines, headache neurologist, who got an MRI of the head which suggested that she has a 5 mm Chiari I malformation.  She sometimes has headache in the back of her head.  She does not have any characteristic pain in her shoulder blades, swallowing difficulty or voice changes.  Patient says that she has been eating a lot and has gained significant weight since January because she works for the [employer] works for the [employer] system] and with the new round of dismissal's, it is giving her a lot of stress.  She is eating a lot of peanuts and realizes that she has gained a lot of weight.  She says that she has blurry vision since January as well.  She has slight diminished hearing on the right side but no frank tinnitus or pulsatile tinnitus.  Driving at night is not difficult for her.  She was here unaccompanied.  Plan : I reviewed the MRI and the measurement by the radiologist is slightly higher than I would have placed it.  According to their measurements it is almost 5 mm but when I measure it, I do not get that measurement as I believe that it is almost 1 to 2 mm.  Regardless, I think she deserves an MRI of the brain with so-called cine flow study.  This shows whether there is pulsatility at the foramen magnum and this would help us  with the diagnosis.  She does have flattened pons which can be seen in the setting of intracranial hypotension but she does not have any subdurals or any other findings that would suggest this.  I have also asked her to see her eye doctor and gave her her on a piece of paper the diagnosis of papilledema to give to them to see if she has this and  whether there would be any indication to pursue a lumbar puncture to check for elevated intracranial pressures in the setting of the blurry vision and significant weight gain.  I will see her back in a few weeks and go from there.  She plans to bring the report of her eye doctor with her.   Social History   Socioeconomic History   Marital status: Single    Spouse name: Not on file   Number of children: 0   Years of education: Assoc   Highest education level: Not on file  Occupational History   Occupation: Office Support  Tobacco Use   Smoking status: Never   Smokeless tobacco: Never  Vaping Use   Vaping status: Never Used  Substance and Sexual Activity   Alcohol use: No   Drug use: No   Sexual activity: Not Currently  Other Topics Concern   Not on file  Social History Narrative   College grad   Single   Bought her own home in '09   Occupation: Doctor, general practice and cleaning   Fun: Resting, yard work   Denies religious beliefs effecting health care.    Drinks caffeine daily    Social Drivers of Corporate investment banker Strain: Not on file  Food Insecurity: Not on file  Transportation Needs: Not on file  Physical  Activity: Not on file  Stress: Not on file  Social Connections: Not on file  Intimate Partner Violence: Not on file    Family History  Problem Relation Age of Onset   Hyperlipidemia Mother    Hypertension Mother    Heart disease Father    Hypertension Father    Diabetes Father    Diabetes Brother    Kidney cancer Brother 47   Diabetes Brother    Heart disease Maternal Grandmother    Headache Neg Hx    Colon cancer Neg Hx    Prostate cancer Neg Hx    Rectal cancer Neg Hx    Stomach cancer Neg Hx    Esophageal cancer Neg Hx     Allergies  Allergen Reactions   Bactrim  Ds [Sulfamethoxazole -Trimethoprim ] Other (See Comments)    Blisters    Cephalosporins     Hives    Oseltamivir Phosphate Swelling   Amoxicillin  Hives and Rash    Has patient had a  PCN reaction causing immediate rash, facial/tongue/throat swelling, SOB or lightheadedness with hypotension: Yes Has patient had a PCN reaction causing severe rash involving mucus membranes or skin necrosis: Yes Has patient had a PCN reaction that required hospitalization No Has patient had a PCN reaction occurring within the last 10 years: Yes If all of the above answers are NO, then may proceed with Cephalosporin use.    Fluconazole  Rash    Added blisters     Past Medical History:  Diagnosis Date   Allergic rhinitis    Allergy    B12 deficiency    Carpal tunnel syndrome    Dysfunction of eustachian tube    GERD (gastroesophageal reflux disease)     Past Surgical History:  Procedure Laterality Date   BUNIONECTOMY     right    CHOLECYSTECTOMY N/A 09/25/2015   Procedure: LAPAROSCOPIC CHOLECYSTECTOMY WITH INTRAOPERATIVE CHOLANGIOGRAM;  Surgeon: Alm Angle, MD;  Location: [facility];  Service: General;  Laterality: N/A;   TONSILLECTOMY       Physical Exam     Results for orders placed or performed during the hospital encounter of 10/08/22  MR Brain Wo Contrast   Narrative   CLINICAL DATA:  Headache, new onset (Age >= 51y). History of Chiari malformation.  EXAM: MRI HEAD WITHOUT CONTRAST  TECHNIQUE: Multiplanar, multiecho pulse sequences of the brain and surrounding structures were obtained without intravenous contrast.  COMPARISON:  Head MRI 10/05/2016  FINDINGS: Brain: There is extensive magnetic susceptibility artifact from orthodontic braces. This is particularly notable on diffusion weighted and susceptibility weighted imaging where large portions of the posterior fossa and frontal and temporal lobes are obscured, limiting assessment for acute infarct and hemorrhage. Within this limitation, no acute infarct, intracranial hemorrhage, mass, midline shift, or extra-axial fluid collection is identified. Scattered small T2 hyperintensities in the cerebral white  matter are stable to minimally increased. The ventricles are normal in size. The cerebellar tonsils extend 5 mm below the foramen magnum, unchanged and with a normal rounded morphology.  Vascular: Major intracranial vascular flow voids are preserved.  Skull and upper cervical spine: Unremarkable bone marrow signal. No syrinx in the included upper cervical spinal cord.  Sinuses/Orbits: Grossly unremarkable orbits. The paranasal sinuses are largely obscured. Clear mastoid air cells.  Other: None.  IMPRESSION: 1. No acute intracranial abnormality within limitations of artifact from braces. 2. Unchanged 5 mm cerebellar tonsillar ectopia/borderline Chiari I malformation. 3. Mild cerebral white matter T2 signal changes, nonspecific though may reflect early chronic small  vessel ischemia, migraines, or prior infection/inflammation.   Electronically Signed   By: Dasie Hamburg M.D.   On: 10/27/2022 16:43

## 2023-09-06 ENCOUNTER — Ambulatory Visit: Admitting: Licensed Clinical Social Worker

## 2023-09-06 DIAGNOSIS — F329 Major depressive disorder, single episode, unspecified: Secondary | ICD-10-CM

## 2023-09-06 DIAGNOSIS — F411 Generalized anxiety disorder: Secondary | ICD-10-CM | POA: Diagnosis not present

## 2023-09-06 NOTE — Progress Notes (Signed)
 [facility] Counselor/Therapist Progress Note  Patient ID: Rhonda [name], MRN: [id number]    Date: 09/06/23  Time Spent: 0405  pm - 0502 pm : 57 Minutes  Treatment Type: Individual Therapy.  Reported Symptoms: Patient reports having many life changes. Patient reports that her father passed 3 years ago from COVID. Her brother was diagnosed with Cancer. Patient is fearful of losing her job.   Mental Status Exam: Appearance:  Neat     Behavior: Appropriate  Motor: Normal  Speech/Language:  Clear and Coherent  Affect: Depressed  Mood: depressed  Thought process: normal  Thought content:   WNL  Sensory/Perceptual disturbances:   WNL  Orientation: oriented to person, place, time/date, situation, day of week, month of year, and year  Attention: Good  Concentration: Good  Memory: WNL  Fund of knowledge:  Good  Insight:   Good  Judgment:  Good  Impulse Control: Good    Risk Assessment: Danger to Self:  No Self-injurious Behavior: No Danger to Others: No Duty to Warn:no Physical Aggression / Violence:No  Access to Firearms a concern: No  Gang Involvement:No    Subjective:    Rhonda [name] participated I person from office, located at [employer]. Cortney consented to treatment. Therapist participated from office.   Rhonda [name] presented for her session stating that she made a 100% on her last review. She states that she was happy and feels that it helped her to feel more confident in herself. She states that she has also been more motivated to get her home in order. She states that she got her kitchen cleaned and it feels good. She states that she is planning to continue to work on her home and get organized. She reports that work is still stressful but she is working to better her life at home and provide a space to relax and have family over. She states she needs good things in her life.   Clinician actively listened and provided positive feedback. Clinician  processed with patient the importance of reminding herself that she knows her job and does it well. Clinician and patient processed the importance of using her time away from work in a positive way so that she doesn't dwell on work but rather focussed on family and friends and relaxing. Clinician processed with patient how organizing ones space can help you to feel in control and make your space more calm and inviting.  Rhonda [name] was fully engaged in discussion and was pleasant and cooperative. She will use CBT, mindfulness and coping skills to help manage decrease symptoms associated with her diagnosis.  Rhonda [name] will reduce overall level, frequency, and intensity of the feelings of depression and anxiety evidenced by decreased irritability, negative self talk, and helpless feelings. She will continue to engage in bi weekly therapy sessions. Treatment planning to be reviewed by 08/02/2024.   Interventions: Cognitive Behavioral Therapy, Dialectical Behavioral Therapy, Assertiveness/Communication, Motivational Interviewing, and Solution-Oriented/Positive Psychology  Diagnosis: Major Depressive Disorder, single episode, Generalized Anxiety Disorder   Rhonda [name] MSW, LCSW/DATE 09/06/2023   Individualized Treatment Plan Strengths: Patient is a dedicated employee.  Supports: Patient reports that her mother is a support.   Goal/Needs for Treatment:  In order of importance to patient 1) I want to be able to vocalize how I feel without being ashamed. 2) I want to find balance and make progress in my life.    Client Statement of Needs: Patient desires to find balance in her life and voice her feelings.  Treatment Level:Moderate-Bi weekly  Symptoms: Depression and anxiety, poor concentration, loss interest  Client Treatment Preferences:Face to Face   Healthcare consumer's goal for treatment:  Therapist, Rhonda [name] MSW, LCSW will support the patient's ability to achieve the goals identified.  Cognitive Behavioral Therapy, Assertive Communication/Conflict Resolution Training, Relaxation Training, ACT, Humanistic and other evidenced-based practices will be used to promote progress towards healthy functioning.   Healthcare consumer will: Actively participate in therapy, working towards healthy functioning.    *Justification for Continuation/Discontinuation of Goal: R=Revised, O=Ongoing, A=Achieved, D=Discontinued  Goal 1) I want to be able to vocalize how I feel without being ashamed. Baseline date 09/06/2023: Progress towards goal Ongoing; How Often - Daily Target Date Goal Was reviewed Status Code Progress towards goal/Likert rating  09/05/2024  O Ongoing            Rhonda [name] will focus on identifying and labeling her emotions, using I statements to communicate them, and practicing self-compassion. She will start by acknowledging her feelings, then calmly and honestly express them to others, taking responsibility for her emotions. Remembering that vulnerability is a strength, and sharing feelings can foster deeper connections.  1. Identifying and Labeling Emotions: Recognize your feelings: . Pay attention to your physical sensations, thoughts, and behaviors to understand what emotions you're experiencing. Name the feeling: . Labeling your emotions, such as I feel sad, I feel angry, or I feel anxious, can help you understand and process them.  2. Communicating Your Feelings: Use I statements: Frame your feelings in terms of your own experience, for example, I feel hurt when... or I felt angry when... rather than blaming others.  Be specific: Instead of vague statements, clearly describe what triggered your feelings and how they are affecting you.  Stay calm and factual: Avoid emotional outbursts or dramatic language. Focus on the facts of the situation and your emotional response.  Be mindful of your tone and body language: Maintain a calm and open posture to  encourage a positive and receptive response from others.  Practice active listening: When others share their feelings, listen attentively and empathetically.  Seek support: Don't hesitate to talk to trusted friends, family members, or a therapist if you need help processing your emotions.  3. Overcoming Shame: Practice self-compassion: Treat yourself with kindness and understanding, acknowledging that everyone experiences a range of emotions.  Challenge negative self-talk: Replace self-critical thoughts with positive affirmations and self-compassionate messages.  Focus on your strengths: Remind yourself of your positive qualities and accomplishments to build self-esteem.  Consider the source of shame: Reflect on where your feelings of shame originate and challenge any negative beliefs you may have about expressing your emotions.   Goal 2) I want to find balance and make progress in my life. Baseline date 09/06/2023: Progress towards goal Ongoing; How Often - Daily Target Date Goal Was reviewed Status Code Progress towards goal  09/06/2023               Rhonda [name] will prioritize activities, managing time effectively, and nurturing all aspects of well-being. This includes setting realistic goals, establishing boundaries, and being mindful of  physical, mental, and emotional health. Additionally, practicing self-care, cultivating mindfulness, and engaging in hobbies can contribute to a more balanced and harmonious life.  Here's a more detailed approach: 1. Self-Reflection and Awareness: Identify your values and priorities: Understand what truly matters to you. This will help you make choices that align with your core values.  Assess your current state: Take time for self-reflection. Are there areas  where you feel imbalanced or overwhelmed?.  Recognize your limitations: Be realistic about your time, energy, and resources.  2. Time Management: Prioritize your activities: Focus on the most  important tasks and delegate or eliminate less crucial ones.  Time blocking: Schedule specific blocks of time for different activities, including work, personal time, and self-care.  Learn to say no: Don't overcommit yourself. Protect your time and energy by politely declining requests that don't align with your priorities.  3. Nurturing Well-being: Physical health: Prioritize exercise, healthy eating, and sufficient sleep.  Mental and emotional health: Practice mindfulness, meditation, and journaling. Engage in activities that bring you joy and relaxation.  Work-life balance: Establish clear boundaries between work and personal life.  Social connections: Nurture your relationships with family and friends.  4. Embracing Mindfulness and Adaptability: Mindfulness: Stay present in the moment and acknowledge your feelings without judgment.  Be open to change: Life is dynamic, so be adaptable and willing to adjust your approach as needed.  Seek support: Don't hesitate to ask for help from friends, family, or professionals when needed.  5. Specific Practices: Journaling: Track your time, reflect on your experiences, and identify areas for improvement.  Meditation: Regular meditation can help you cultivate inner peace and focus.  Engage in hobbies: Pursue activities that you enjoy and that help you relax and recharge.  Declutter your space: A tidy physical space can contribute to a clearer mind.   This plan has been reviewed and created by the following participants:  This plan will be reviewed at least every 12 months. Date Behavioral Health Clinician Date Guardian/Patient   09/06/2023 Rhonda [name]  09/06/2023 Verbal Consent Provided

## 2023-09-14 ENCOUNTER — Ambulatory Visit: Payer: Self-pay | Admitting: Gastroenterology

## 2023-09-14 ENCOUNTER — Encounter: Payer: Self-pay | Admitting: Gastroenterology

## 2023-09-14 ENCOUNTER — Ambulatory Visit (HOSPITAL_COMMUNITY)
Admission: RE | Admit: 2023-09-14 | Discharge: 2023-09-14 | Disposition: A | Discharge status: Home / Self Care | Source: Ambulatory Visit | Attending: Neurosurgery | Admitting: Neurosurgery

## 2023-09-14 ENCOUNTER — Ambulatory Visit: Admitting: Gastroenterology

## 2023-09-14 ENCOUNTER — Other Ambulatory Visit (INDEPENDENT_AMBULATORY_CARE_PROVIDER_SITE_OTHER)

## 2023-09-14 ENCOUNTER — Other Ambulatory Visit: Payer: Self-pay | Admitting: Neurosurgery

## 2023-09-14 VITALS — BP 96/60 | HR 88 | Ht 65.5 in | Wt 221.4 lb

## 2023-09-14 DIAGNOSIS — K219 Gastro-esophageal reflux disease without esophagitis: Secondary | ICD-10-CM

## 2023-09-14 DIAGNOSIS — R1033 Periumbilical pain: Secondary | ICD-10-CM | POA: Diagnosis not present

## 2023-09-14 DIAGNOSIS — R110 Nausea: Secondary | ICD-10-CM

## 2023-09-14 DIAGNOSIS — G935 Compression of brain: Secondary | ICD-10-CM | POA: Diagnosis not present

## 2023-09-14 DIAGNOSIS — R9082 White matter disease, unspecified: Secondary | ICD-10-CM | POA: Diagnosis not present

## 2023-09-14 LAB — CBC WITH DIFFERENTIAL/PLATELET
Basophils Absolute: 0.1 K/uL (ref 0.0–0.1)
Basophils Relative: 0.8 % (ref 0.0–3.0)
Eosinophils Absolute: 0.4 K/uL (ref 0.0–0.7)
Eosinophils Relative: 5.1 % — ABNORMAL HIGH (ref 0.0–5.0)
HCT: 40 % (ref 36.0–46.0)
Hemoglobin: 13.6 g/dL (ref 12.0–15.0)
Lymphocytes Relative: 27 % (ref 12.0–46.0)
Lymphs Abs: 2.2 K/uL (ref 0.7–4.0)
MCHC: 34.1 g/dL (ref 30.0–36.0)
MCV: 89.5 fl (ref 78.0–100.0)
Monocytes Absolute: 0.6 K/uL (ref 0.1–1.0)
Monocytes Relative: 7.4 % (ref 3.0–12.0)
Neutro Abs: 4.8 K/uL (ref 1.4–7.7)
Neutrophils Relative %: 59.7 % (ref 43.0–77.0)
Platelets: 423 K/uL — ABNORMAL HIGH (ref 150.0–400.0)
RBC: 4.47 Mil/uL (ref 3.87–5.11)
RDW: 13.6 % (ref 11.5–15.5)
WBC: 8 K/uL (ref 4.0–10.5)

## 2023-09-14 LAB — LIPASE: Lipase: 10 U/L — ABNORMAL LOW (ref 11.0–59.0)

## 2023-09-14 LAB — COMPREHENSIVE METABOLIC PANEL WITH GFR
ALT: 13 U/L (ref 0–35)
AST: 14 U/L (ref 0–37)
Albumin: 4 g/dL (ref 3.5–5.2)
Alkaline Phosphatase: 109 U/L (ref 39–117)
BUN: 12 mg/dL (ref 6–23)
CO2: 29 meq/L (ref 19–32)
Calcium: 8.8 mg/dL (ref 8.4–10.5)
Chloride: 101 meq/L (ref 96–112)
Creatinine, Ser: 0.87 mg/dL (ref 0.40–1.20)
GFR: 75.76 mL/min (ref >60.00)
Glucose, Bld: 100 mg/dL — ABNORMAL HIGH (ref 70–99)
Potassium: 3.8 meq/L (ref 3.5–5.1)
Sodium: 139 meq/L (ref 135–145)
Total Bilirubin: 0.6 mg/dL (ref 0.2–1.2)
Total Protein: 7.3 g/dL (ref 6.0–8.3)

## 2023-09-14 MED ORDER — IBGARD 90 MG PO CPCR
ORAL_CAPSULE | ORAL | Status: AC
Start: 2023-09-14 — End: ?

## 2023-09-14 MED ORDER — ONDANSETRON 4 MG PO TBDP: 4.0000 mg | ORAL_TABLET | Freq: Three times a day (TID) | ORAL | 1 refills | Status: AC | PRN

## 2023-09-14 NOTE — Progress Notes (Signed)
 SABRA

## 2023-09-14 NOTE — Progress Notes (Signed)
 HPI :  53 year old female here for a follow-up visit, today for abdominal pain.  Recall she has a history of GERD, has also had history of globus in the past. Her history of GERD dates back several years, and she has been on Nexium  in the past. An upper endoscopy performed on September 12, 2019, showed mild gastric erythema and a few small gastric polyps, which were benign. There was no evidence of Barrett's esophagus. A colonoscopy on the same day revealed sigmoid diverticulosis and small internal hemorrhoids, but no polyps. Pathology showed fundic gland polyps with no H. pylori infection.  She has a history of cholecystectomy.  She states for the past 3 weeks she has been having bothersome abdominal pain.  She localizes this to the periumbilical area.  She states it occurs shortly after eating, she will feel sharp pain that can radiate down into her lower abdomen.  She has felt nauseated and generally does not vomit.  Symptoms have persisted.  It lasts anywhere from seconds to minutes and then resolves until she eats again.  She has been on Nexium  40 mg daily.  After our last visit for globus she increased to twice daily for several weeks and then reduced back to 1 week.  She denies any NSAID use, using Tylenol  as needed.  Her globus can come and go it seems mild, heartburn seems generally controlled.  She has some mild early satiety.  Denies any constipation recently, can have some gas pains at times.  No blood in her stools, bowel seems stable at this time.     Past Medical History:  Diagnosis Date   Allergic rhinitis    Allergy    B12 deficiency    Carpal tunnel syndrome    Dysfunction of eustachian tube    GERD (gastroesophageal reflux disease)    Migraines      Past Surgical History:  Procedure Laterality Date   BUNIONECTOMY     right    CHOLECYSTECTOMY N/A 09/25/2015   Procedure: LAPAROSCOPIC CHOLECYSTECTOMY WITH INTRAOPERATIVE CHOLANGIOGRAM;  Surgeon: Alm Angle, MD;  Location:  [facility];  Service: General;  Laterality: N/A;   TONSILLECTOMY     Family History  Problem Relation Age of Onset   Hyperlipidemia Mother    Hypertension Mother    Heart disease Father    Hypertension Father    Diabetes Father    Diabetes Brother    Kidney cancer Brother 27   Diabetes Brother    Heart disease Maternal Grandmother    Headache Neg Hx    Colon cancer Neg Hx    Prostate cancer Neg Hx    Rectal cancer Neg Hx    Stomach cancer Neg Hx    Esophageal cancer Neg Hx    Social History   Tobacco Use   Smoking status: Never   Smokeless tobacco: Never  Vaping Use   Vaping status: Never Used  Substance Use Topics   Alcohol use: No   Drug use: No   Current Outpatient Medications  Medication Sig Dispense Refill   acetaminophen  (TYLENOL ) 325 MG tablet Take 325 mg by mouth every 4 (four) hours as needed for fever.     ALPRAZolam  (XANAX ) 0.5 MG tablet Take 1 tablet (0.5 mg total) by mouth 2 (two) times daily as needed for anxiety. 20 tablet 0   esomeprazole  (NEXIUM ) 40 MG capsule Take 1 capsule (40 mg total) by mouth daily at 12 noon. Can increase to twice a day if needed 90 capsule 0  OIL OF OREGANO PO Take 6,000 mg by mouth daily.     topiramate  (TOPAMAX ) 25 MG tablet Take 1 tablet (25 mg total) by mouth at bedtime. 30 tablet 6   venlafaxine  XR (EFFEXOR  XR) 37.5 MG 24 hr capsule Take 1 capsule (37.5 mg total) by mouth daily with breakfast. (Patient not taking: Reported on 09/14/2023) 30 capsule 5   No current facility-administered medications for this visit.   Allergies  Allergen Reactions   Bactrim  Ds [Sulfamethoxazole -Trimethoprim ] Other (See Comments)    Blisters    Cephalosporins     Hives    Oseltamivir Phosphate Swelling   Amoxicillin  Hives and Rash    Has patient had a PCN reaction causing immediate rash, facial/tongue/throat swelling, SOB or lightheadedness with hypotension: Yes Has patient had a PCN reaction causing severe rash involving mucus membranes or skin  necrosis: Yes Has patient had a PCN reaction that required hospitalization No Has patient had a PCN reaction occurring within the last 10 years: Yes If all of the above answers are NO, then may proceed with Cephalosporin use.    Fluconazole  Rash    Added blisters      Review of Systems: All systems reviewed and negative except where noted in HPI.    No results found.  Physical Exam: BP 96/60 (BP Location: Left Arm, Patient Position: Sitting, Cuff Size: Large)   Pulse 88   Ht 5' 5.5 (1.664 m) Comment: height measured without shoes  Wt 221 lb 6 oz (100.4 kg)   BMI 36.28 kg/m  Constitutional: Pleasant,well-developed, female in no acute distress. Abdominal: Soft, nondistended, periumbilical to lower abdominal TTP.  There are no masses palpable.  Neurological: Alert and oriented to person place and time. Psychiatric: Normal mood and affect. Behavior is normal.   ASSESSMENT: 53 y.o. female here for assessment of the following  1. Periumbilical abdominal pain   2. Nausea without vomiting   3. Gastroesophageal reflux disease, unspecified whether esophagitis present    Episodic postprandial abdominal pain in the mid to lower abdomen, localizes in the periumbilical area, associate with some nausea.  She is status post cholecystectomy.  History of EGD as above without concerning findings, she is on PPI and avoids NSAIDs.  I do not think this is upper tract based on location of her symptoms.  Discussed DDx.  Recommending CT scan abdomen pelvis to further evaluate this pain which has persisted now for a few weeks and routinely bothering her.  If she has any significant pain that fails to abate or vomiting, intolerant of p.o. she will need to go to the ED.  Otherwise will get baseline labs today to make sure stable, CBC, c-Met, lipase.  I will give her some Zofran  to use.  For nausea and gave her samples of IBgard to use empirically for cramps as she does endorse bloating/gas.  She will  continue Nexium  for now, reflux seems well-controlled.  PLAN: - lab today - CBC, CMET, lipase - CT scan abdomen / pelvis - Zofran  4mg  ODT every 6 hours PRN - samples of IB gard to use PRN empirically - continue nexium  - if worsening pain, nausea / vomiting, intolerance of PO, etc, will need to go to the ED  Marcey Naval, MD [facility]

## 2023-09-14 NOTE — Patient Instructions (Addendum)
 You have been scheduled for a CT scan of the abdomen and pelvis at [facility], 1st floor Radiology. You are scheduled on ________________ at ___________. You should arrive ___________ minutes prior to your appointment time for registration and to drink contrast as needed.    If you have any questions regarding your exam or if you need to reschedule, you may call [facility] Radiology Scheduling at [phone] between the hours of 8:00 am and 5:00 pm, Monday-Friday.    Please go to the lab in the basement of our building to have lab work done as you leave today. Hit B for basement when you get on the elevator.  When the doors open the lab is on your left.  We will call you with the results. Thank you.  We have sent the following medications to your pharmacy for you to pick up at your convenience: Zofran  4mg  ODT: dissolve 1 tablet orally every 6 hours as needed for nausea  We have given you samples of the following medication to take:  IBgard - use as needed  Thank you for entrusting me with your care and for choosing [facility], Dr. Elspeth Naval    _______________________________________________________  If your blood pressure at your visit was 140/90 or greater, please contact your primary care physician to follow up on this.  _______________________________________________________  If you are age 4 or older, your body mass index should be between 23-30. Your Body mass index is 36.28 kg/m. If this is out of the aforementioned range listed, please consider follow up with your Primary Care Provider.  If you are age 36 or younger, your body mass index should be between 19-25. Your Body mass index is 36.28 kg/m. If this is out of the aformentioned range listed, please consider follow up with your Primary Care Provider.   ________________________________________________________  The [facility] providers would like to encourage you to use MYCHART to communicate with  providers for non-urgent requests or questions.  Due to long hold times on the telephone, sending your provider a message by [facility] may be a faster and more efficient way to get a response.  Please allow 48 business hours for a response.  Please remember that this is for non-urgent requests.  _______________________________________________________  [facility] is using a team-based approach to care.  Your team is made up of your doctor and two to three APPS. Our APPS (Nurse Practitioners and Physician Assistants) work with your physician to ensure care continuity for you. They are fully qualified to address your health concerns and develop a treatment plan. They communicate directly with your gastroenterologist to care for you. Seeing the Advanced Practice Practitioners on your physician's team can help you by facilitating care more promptly, often allowing for earlier appointments, access to diagnostic testing, procedures, and other specialty referrals.

## 2023-09-19 ENCOUNTER — Ambulatory Visit (HOSPITAL_COMMUNITY)
Admission: RE | Admit: 2023-09-19 | Discharge: 2023-09-19 | Disposition: A | Discharge status: Home / Self Care | Source: Ambulatory Visit | Attending: Gastroenterology | Admitting: Gastroenterology

## 2023-09-19 DIAGNOSIS — Z9049 Acquired absence of other specified parts of digestive tract: Secondary | ICD-10-CM | POA: Diagnosis not present

## 2023-09-19 DIAGNOSIS — R1033 Periumbilical pain: Secondary | ICD-10-CM | POA: Insufficient documentation

## 2023-09-19 DIAGNOSIS — K219 Gastro-esophageal reflux disease without esophagitis: Secondary | ICD-10-CM | POA: Diagnosis not present

## 2023-09-19 DIAGNOSIS — R110 Nausea: Secondary | ICD-10-CM | POA: Diagnosis not present

## 2023-09-19 MED ORDER — IOHEXOL 300 MG/ML  SOLN
100.0000 mL | Freq: Once | INTRAMUSCULAR | Status: AC | PRN
  Administered 2023-09-19: 100 mL via INTRAVENOUS

## 2023-09-28 ENCOUNTER — Ambulatory Visit: Admitting: Licensed Clinical Social Worker

## 2023-10-09 ENCOUNTER — Encounter: Payer: Self-pay | Admitting: Internal Medicine

## 2023-10-09 ENCOUNTER — Ambulatory Visit: Admitting: Internal Medicine

## 2023-10-09 VITALS — BP 126/78 | HR 90 | Temp 98.2°F | Resp 16 | Ht 65.5 in | Wt 224.0 lb

## 2023-10-09 DIAGNOSIS — Z23 Encounter for immunization: Secondary | ICD-10-CM | POA: Diagnosis not present

## 2023-10-09 DIAGNOSIS — Z0001 Encounter for general adult medical examination with abnormal findings: Secondary | ICD-10-CM | POA: Diagnosis not present

## 2023-10-09 DIAGNOSIS — E538 Deficiency of other specified B group vitamins: Secondary | ICD-10-CM | POA: Diagnosis not present

## 2023-10-09 DIAGNOSIS — F321 Major depressive disorder, single episode, moderate: Secondary | ICD-10-CM

## 2023-10-09 MED ORDER — VILAZODONE HCL 10 MG PO TABS: 10.0000 mg | ORAL_TABLET | Freq: Every day | ORAL | 0 refills | Status: DC

## 2023-10-09 MED ORDER — CYANOCOBALAMIN 2000 MCG PO TABS: 2000.0000 ug | ORAL_TABLET | Freq: Every day | ORAL | 0 refills | Status: AC

## 2023-10-09 MED ORDER — VILAZODONE HCL 20 MG PO TABS: 1.0000 | ORAL_TABLET | Freq: Every day | ORAL | 0 refills | Status: DC

## 2023-10-09 NOTE — Patient Instructions (Addendum)
 You received your first Hepatitis B vaccine today. Please return for the second dose in one month.   Health Maintenance, Female Adopting a healthy lifestyle and getting preventive care are important in promoting health and wellness. Ask your health care provider about: The right schedule for you to have regular tests and exams. Things you can do on your own to prevent diseases and keep yourself healthy. What should I know about diet, weight, and exercise? Eat a healthy diet  Eat a diet that includes plenty of vegetables, fruits, low-fat dairy products, and lean protein. Do not eat a lot of foods that are high in solid fats, added sugars, or sodium. Maintain a healthy weight Body mass index (BMI) is used to identify weight problems. It estimates body fat based on height and weight. Your health care provider can help determine your BMI and help you achieve or maintain a healthy weight. Get regular exercise Get regular exercise. This is one of the most important things you can do for your health. Most adults should: Exercise for at least 150 minutes each week. The exercise should increase your heart rate and make you sweat (moderate-intensity exercise). Do strengthening exercises at least twice a week. This is in addition to the moderate-intensity exercise. Spend less time sitting. Even light physical activity can be beneficial. Watch cholesterol and blood lipids Have your blood tested for lipids and cholesterol at 54 years of age, then have this test every 5 years. Have your cholesterol levels checked more often if: Your lipid or cholesterol levels are high. You are older than 54 years of age. You are at high risk for heart disease. What should I know about cancer screening? Depending on your health history and family history, you may need to have cancer screening at various ages. This may include screening for: Breast cancer. Cervical cancer. Colorectal cancer. Skin cancer. Lung  cancer. What should I know about heart disease, diabetes, and high blood pressure? Blood pressure and heart disease High blood pressure causes heart disease and increases the risk of stroke. This is more likely to develop in people who have high blood pressure readings or are overweight. Have your blood pressure checked: Every 3-5 years if you are 88-54 years of age. Every year if you are 38 years old or older. Diabetes Have regular diabetes screenings. This checks your fasting blood sugar level. Have the screening done: Once every three years after age 23 if you are at a normal weight and have a low risk for diabetes. More often and at a younger age if you are overweight or have a high risk for diabetes. What should I know about preventing infection? Hepatitis B If you have a higher risk for hepatitis B, you should be screened for this virus. Talk with your health care provider to find out if you are at risk for hepatitis B infection. Hepatitis C Testing is recommended for: Everyone born from [birth year] through 1966. Anyone with known risk factors for hepatitis C. Sexually transmitted infections (STIs) Get screened for STIs, including gonorrhea and chlamydia, if: You are sexually active and are younger than 54 years of age. You are older than 54 years of age and your health care provider tells you that you are at risk for this type of infection. Your sexual activity has changed since you were last screened, and you are at increased risk for chlamydia or gonorrhea. Ask your health care provider if you are at risk. Ask your health care provider about whether you  are at high risk for HIV. Your health care provider may recommend a prescription medicine to help prevent HIV infection. If you choose to take medicine to prevent HIV, you should first get tested for HIV. You should then be tested every 3 months for as long as you are taking the medicine. Pregnancy If you are about to stop having your  period (premenopausal) and you may become pregnant, seek counseling before you get pregnant. Take 400 to 800 micrograms (mcg) of folic acid every day if you become pregnant. Ask for birth control (contraception) if you want to prevent pregnancy. Osteoporosis and menopause Osteoporosis is a disease in which the bones lose minerals and strength with aging. This can result in bone fractures. If you are 68 years old or older, or if you are at risk for osteoporosis and fractures, ask your health care provider if you should: Be screened for bone loss. Take a calcium or vitamin D supplement to lower your risk of fractures. Be given hormone replacement therapy (HRT) to treat symptoms of menopause. Follow these instructions at home: Alcohol use Do not drink alcohol if: Your health care provider tells you not to drink. You are pregnant, may be pregnant, or are planning to become pregnant. If you drink alcohol: Limit how much you have to: 0-1 drink a day. Know how much alcohol is in your drink. In the [place] one drink equals one 12 oz bottle of beer (355 mL), one 5 oz glass of wine (148 mL), or one 1 oz glass of hard liquor (44 mL). Lifestyle Do not use any products that contain nicotine or tobacco. These products include cigarettes, chewing tobacco, and vaping devices, such as e-cigarettes. If you need help quitting, ask your health care provider. Do not use street drugs. Do not share needles. Ask your health care provider for help if you need support or information about quitting drugs. General instructions Schedule regular health, dental, and eye exams. Stay current with your vaccines. Tell your health care provider if: You often feel depressed. You have ever been abused or do not feel safe at home. Summary Adopting a healthy lifestyle and getting preventive care are important in promoting health and wellness. Follow your health care provider's instructions about healthy diet, exercising, and  getting tested or screened for diseases. Follow your health care provider's instructions on monitoring your cholesterol and blood pressure. This information is not intended to replace advice given to you by your health care provider. Make sure you discuss any questions you have with your health care provider. Document Revised: 06/01/2020 Document Reviewed: 06/01/2020 Elsevier Patient Education  2024 [employer].

## 2023-10-09 NOTE — Progress Notes (Signed)
 Subjective:  Patient ID: Rhonda [name], female    DOB: 1970-05-14  Age: 53 y.o. MRN: [id number]  CC: Annual Exam   HPI Rhonda [name] presents for a CPX and f/up -----  Discussed the use of AI scribe software for clinical note transcription with the patient, who gave verbal consent to proceed.  History of Present Illness Rhonda [name] is a 53 year old female with Chiari malformation who presents with headaches and nausea.  She experiences persistent headaches, described as 'little headaches'. She has been evaluated by a neurologist for her Chiari malformation and has an upcoming appointment with a neurosurgeon. She recently underwent an MRI and is scheduled to follow up with the neurosurgeon tomorrow. She notes occasional blurry vision and floaters.  She experiences frequent nausea, more so than vomiting, with recent episodes occurring yesterday and on Saturday. No specific triggers have been identified. She has a prescription for nausea medication at the pharmacy but has not yet picked it up.  She is currently taking Nexium  and occasionally takes Xanax  for anxiety. She previously stopped taking venlafaxine  due to concerns about elevated blood pressure. She describes feeling anxious and agitated, noting that she is 'wound up real high all the time'. She also reports difficulty sleeping and increased appetite, feeling 'swollen', 'bloated', and 'heavy', particularly in her stomach, which affects her mobility.  She mentions a sensation of having something in her throat, which has been evaluated by an ear, nose, and throat specialist, but she reports that nobody has found anything. She also reports a white buildup on her tongue since having COVID, which she finds annoying.  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 She is concerned about potential job loss due to administrative changes and a decrease in her productivity since transitioning  from working at home to the office.     Outpatient Medications Prior to Visit  Medication Sig Dispense Refill   acetaminophen  (TYLENOL ) 325 MG tablet Take 325 mg by mouth every 4 (four) hours as needed for fever.     ALPRAZolam  (XANAX ) 0.5 MG tablet Take 1 tablet (0.5 mg total) by mouth 2 (two) times daily as needed for anxiety. 20 tablet 0   esomeprazole  (NEXIUM ) 40 MG capsule Take 1 capsule (40 mg total) by mouth daily at 12 noon. Can increase to twice a day if needed 90 capsule 0   OIL OF OREGANO PO Take 6,000 mg by mouth daily.     ondansetron  (ZOFRAN -ODT) 4 MG disintegrating tablet Take 1 tablet (4 mg total) by mouth every 8 (eight) hours as needed for nausea or vomiting. 20 tablet 1   Peppermint Oil (IBGARD) 90 MG CPCR Take as directed     topiramate  (TOPAMAX ) 25 MG tablet Take 1 tablet (25 mg total) by mouth at bedtime. 30 tablet 6   venlafaxine  XR (EFFEXOR  XR) 37.5 MG 24 hr capsule Take 1 capsule (37.5 mg total) by mouth daily with breakfast. (Patient not taking: Reported on 09/14/2023) 30 capsule 5   No facility-administered medications prior to visit.    ROS Review of Systems  Objective:  BP 126/78 (BP Location: Left Arm, Patient Position: Sitting, Cuff Size: Normal)   Pulse 90   Temp 98.2 F (36.8 C) (Oral)   Resp 16   Ht 5' 5.5 (1.664 m)   Wt 224 lb (101.6 kg)   SpO2 96%   BMI 36.71 kg/m   BP Readings from Last 3 Encounters:  10/09/23 126/78  09/14/23 96/60  09/05/23 123/89    Wt Readings from Last 3 Encounters:  10/09/23 224 lb (101.6 kg)  09/14/23 221 lb 6 oz (100.4 kg)  09/05/23 220 lb (99.8 kg)    Physical Exam  Lab Results  Component Value Date   WBC 8.0 09/14/2023   HGB 13.6 09/14/2023   HCT 40.0 09/14/2023   PLT 423.0 (H) 09/14/2023   GLUCOSE 100 (H) 09/14/2023   CHOL 202 (H) 07/12/2023   TRIG 64.0 07/12/2023   HDL 65.00 07/12/2023   LDLCALC 124 (H) 07/12/2023   ALT 13 09/14/2023   AST 14 09/14/2023   NA 139 09/14/2023   K 3.8  09/14/2023   CL 101 09/14/2023   CREATININE 0.87 09/14/2023   BUN 12 09/14/2023   CO2 29 09/14/2023   TSH 0.73 07/12/2023   HGBA1C 5.5 07/12/2023    CT ABDOMEN PELVIS W CONTRAST Result Date: 09/25/2023 EXAM: CT ABDOMEN AND PELVIS WITH CONTRAST 09/19/2023 04:45:00 PM TECHNIQUE: CT of the abdomen and pelvis was performed with the administration of intravenous contrast (100mL iohexol  (OMNIPAQUE ) 300 MG/ML solution). Multiplanar reformatted images are provided for review. Automated exposure control, iterative reconstruction, and/or weight-based adjustment of the mA/kV was utilized to reduce the radiation dose to as low as reasonably achievable. COMPARISON: Abdominal ultrasound dated 08/27/2005. CLINICAL HISTORY: Periumbilical and lower abdominal pain; nausea without vomiting; lower left abdominal pain for 1 month; constipation; feels sick after eating; status post cholecystectomy; no history of cancer. FINDINGS: LOWER CHEST: Radicular opacities in the posterior periphery of the lower lobes bilaterally. LIVER: Normal size and contour. GALLBLADDER AND BILE DUCTS: Patient is status post cholecystectomy. SPLEEN: Normal size. No focal lesion. PANCREAS: No mass. No ductal dilatation. ADRENAL GLANDS: Normal appearance. No mass. KIDNEYS, URETERS AND BLADDER: No stones in the kidneys or ureters. No hydronephrosis. No perinephric or periureteral stranding. Urinary bladder is unremarkable. GI AND BOWEL: Stomach demonstrates no acute abnormality. There is no bowel obstruction. No bowel wall thickening. PERITONEUM AND RETROPERITONEUM: No ascites. No free air. VASCULATURE: Aorta is normal in caliber. LYMPH NODES: Several shotty mesenteric lymph nodes present. REPRODUCTIVE ORGANS: There is a fibroid in the uterine fundus measuring about 15 mm in diameter. BONES AND SOFT TISSUES: No acute osseous abnormality. No focal soft tissue abnormality. IMPRESSION: 1. No acute findings in the abdomen or pelvis. Electronically signed by:  Evalene Coho MD 09/25/2023 03:00 PM EDT RP Workstation: [id number]    Assessment & Plan:  Need for immunization against influenza -     Flu vaccine trivalent PF, 6mos and older(Flulaval,Afluria,Fluarix,Fluzone)  Current moderate episode of major depressive disorder without prior episode (HCC) -     Vilazodone  HCl; Take 1 tablet (10 mg total) by mouth daily.  Dispense: 7 tablet; Refill: 0 -     Vilazodone  HCl; Take 1 tablet (20 mg total) by mouth daily.  Dispense: 7 tablet; Refill: 0  Encounter for general adult medical examination with abnormal findings  Vitamin B 12 deficiency -     Cyanocobalamin ; Take 1 tablet (2,000 mcg total) by mouth daily.  Dispense: 90 tablet; Refill: 0  Immunization due -     Heplisav-B  (HepB-CPG) Vaccine     Follow-up: Return in about 6 months (around 04/07/2024).  [name], MD

## 2023-10-10 ENCOUNTER — Encounter: Payer: Self-pay | Admitting: Neurosurgery

## 2023-10-10 ENCOUNTER — Ambulatory Visit: Admitting: Neurosurgery

## 2023-10-10 VITALS — BP 127/79 | HR 81 | Ht 65.0 in | Wt 225.0 lb

## 2023-10-10 DIAGNOSIS — G935 Compression of brain: Secondary | ICD-10-CM

## 2023-10-10 DIAGNOSIS — R519 Headache, unspecified: Secondary | ICD-10-CM | POA: Diagnosis not present

## 2023-10-10 NOTE — Progress Notes (Signed)
 53 year old lady with longstanding headaches and a 5 mm tonsillar descent at the foramen magnum.  I went over the options with her the last time and we decided to pursue imaging and further workup and not a lumbar puncture.  She had a cine flow study done which shows that there is very good flow at the foramen magnum anteriorly as well as posteriorly therefore it is unlikely that the 5 mm tonsillar descent is truly causing an obstruction.  She went and saw the eye doctor but unfortunately did not bring the eye report with her and says that he specifically stated that she does not have papilledema.  Her headaches are predominantly in her forehead and on the right frontal side.  Taking all of the above in, I do not feel that she needs a posterior fossa decompression.  Valsalva maneuvers do not make her headache worse and therefore I think that a posterior fossa decompression would not help her.  I told her this and I told her that we can always reconsider in the future if her headache intensity and location changes but I do not think that with this minimal tonsillar descent she would be benefiting from a posterior fossa decompression.  I would like to remain in touch with her and I will see her back in 1 year and after seeing her we can decide whether or not she needs to have repeat imaging.

## 2023-10-12 ENCOUNTER — Ambulatory Visit: Admitting: Licensed Clinical Social Worker

## 2023-10-12 DIAGNOSIS — F331 Major depressive disorder, recurrent, moderate: Secondary | ICD-10-CM | POA: Diagnosis not present

## 2023-10-12 DIAGNOSIS — F329 Major depressive disorder, single episode, unspecified: Secondary | ICD-10-CM

## 2023-10-12 DIAGNOSIS — F411 Generalized anxiety disorder: Secondary | ICD-10-CM | POA: Diagnosis not present

## 2023-10-13 NOTE — Progress Notes (Addendum)
 [facility] Counselor/Therapist Progress Note  Patient ID: Rhonda [name], MRN: [id number]    Date: 10/12/2023  Time Spent: 0304  pm - 0402 pm : 58 Minutes  Treatment Type: Individual Therapy.  Reported Symptoms: Patient reports having many life changes. Patient reports that her father passed 3 years ago from COVID. Her brother was diagnosed with Cancer. Patient is fearful of losing her job.    Mental Status Exam: Appearance:  Neat     Behavior: Appropriate  Motor: Normal  Speech/Language:  Clear and Coherent  Affect: Depressed  Mood: depressed  Thought process: normal  Thought content:   WNL  Sensory/Perceptual disturbances:   WNL  Orientation: oriented to person, place, time/date, situation, day of week, month of year, and year  Attention: Good  Concentration: Good  Memory: WNL  Fund of knowledge:  Good  Insight:   Good  Judgment:  Good  Impulse Control: Good    Risk Assessment: Danger to Self:  No Self-injurious Behavior: No Danger to Others: No Duty to Warn:no Physical Aggression / Violence:No  Access to Firearms a concern: No  Gang Involvement:No    Subjective:    Jon JINNY Harris participated I person from office, located at [employer]. Anaiza consented to treatment. Therapist participated from office.   Archita presented for her session stating she is afraid she is going to lose her job. 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 Patient reports that the environment at work is very stressful. Patient states the stress of this situation has negatively impacted her mentally and physically. Patient states she is trying to remain focused on her work and take things one day at a time. Adelai states that she will know by October or November the extent of what is going to happen. Patient reports that this has affected her sleep, focus, caused loss  of interest and increased depressive and anxiety symptoms.  Clinician actively listened and provided support and encouragement to patient via verbal interaction. Clinician processed patients concerns and discussed with patient details of her concerns. Clinician and patient identified and discussed additional ways to cope with her concerns. We discussed the following:  Mindfulness and breathing: Focus on the present moment and practice deep breathing or meditation to calm your body and mind.  Physical activity: Regular exercise, such as walking or yoga, helps manage stress, improve mood, and release tension.  Healthy habits: Maintain a healthy lifestyle with proper sleep and nutrition to support overall well-being and stability.  Identify triggers: Become aware of the specific situations, thoughts, or feelings that trigger your worries to better manage them.  Challenge your worries: Question the negative thoughts that fuel your worries and reframe them into more balanced perspectives.  Set boundaries: Designate a specific time to focus on your worries and avoid dwelling on them at other times during the day.  Focus on what you can control: Separate worries into actionable items you can address and those you cannot, accepting the latter and moving on.  Use distraction: When worries are overwhelming, temporary distraction with activities like exercise, hobbies, or socializing can provide a mental break.   Yanitza was pleasant and cooperative and engaged fully in session. Doreene is motivated for treatment and will continue to utilize coping skills to assist with coping with her symptoms.Kimberley will continue to engage in CBT therapy bi weekly. Treatment planning to be reviewed by 08/02/2024.  Major Depressive Disorder, recurrent moderate, Generalized Anxiety Disorder  CBT THERAPY  Damien Junk MSW, LCSW/DATE 10/12/2023

## 2023-10-23 ENCOUNTER — Telehealth: Payer: Self-pay | Admitting: Gastroenterology

## 2023-10-23 DIAGNOSIS — K219 Gastro-esophageal reflux disease without esophagitis: Secondary | ICD-10-CM

## 2023-10-23 DIAGNOSIS — R110 Nausea: Secondary | ICD-10-CM

## 2023-10-23 DIAGNOSIS — R109 Unspecified abdominal pain: Secondary | ICD-10-CM

## 2023-10-23 NOTE — Telephone Encounter (Signed)
 Okay thanks.  The CT would have seen an issue there and I do not see any problems with a hernia there.  Again I recommend she increase her Nexium  to twice daily dosing and if this persists then we can consider an upper endoscopy or office follow-up if further exam is needed.  Thanks

## 2023-10-23 NOTE — Telephone Encounter (Signed)
 Patient calls stating that she continues to have twinges of pain in epigastric area, almost directly below the sternum, though localized more to the left side. States she has also noticed a bulge for 4-5 months and wonders if she has a hernia. States the pain does not improved and is very frequent. Has continued nausea, worse after eating but no vomiting. Antiemetics given by Dr Leigh do help though only minimally. C/O esophageal reflux/burning and cough. Does take Nexium  40 mg daily.  CT done 09/19/23 did not find any acute abdominal or pelvic abnormalities.   Dr Leigh, please advise.SABRASABRA

## 2023-10-23 NOTE — Telephone Encounter (Signed)
 Yes, patient actually mentioned that she had previously explained to you that her discomfort was lower in the abdomen. However, she states that she was incorrect and this pain and bulging is slightly to the left side under the sternum.

## 2023-10-23 NOTE — Telephone Encounter (Signed)
 Okay thanks for clarifying. Recent CT looks okay, she is s/p cholecystectomy. I just examined her a month ago and no obvious hernia there, nor seen on CT. If she finds relationship with eating, we can do an EGD at next step, that sounds reasonable. If not related to eating in any way and more so constant discomfort, perhaps this may be musculoskeletal and clinic visit would be more appropriate in that setting. Can you let me know how she wants to proceed. If she does want the EGD, can do first opening in October. If she does not think she can wait a few weeks could book her with one of my partners who has an opening sooner. Thanks

## 2023-10-23 NOTE — Telephone Encounter (Signed)
 I have spoken to patient who states that she has pain and nausea with eating. She would like to move forward with endoscopy. Patient has been advised of time/date/location for upcoming procedure and has been given generalized verbal prep instructions. Discussed that a care partner 18 years or older should bring her, stay for the procedure and drive home due to sedation. Written instructions have been made available to the patient for additional review.

## 2023-10-23 NOTE — Telephone Encounter (Signed)
 Contacted patient to discuss Dr Hassan recommendations. She states that she already tried increasing Nexium  to twice daily dosing for a couple weeks and it was unhelpful. States at that time, CT was ordered. Would it be appropriate just to move forward with endo or OV with this additional information?

## 2023-10-23 NOTE — Telephone Encounter (Signed)
 Inbound call from patient stating she is still experiencing patient in her LUQ abdomen. Requesting a call to discuss further recommendations. Please advise, thank you

## 2023-10-23 NOTE — Telephone Encounter (Signed)
 Okay - when I saw her in the office her main was much lower / periumbilical, which is why we did the CT scan, which did not show anything, no obvious hernia seen.  Have her symptoms changed? Sounds more upper based on what you report. She could try increasing her nexium  to twice daily for the next several days and see if that helps. If symptoms persist may need to consider endoscopy.

## 2023-11-06 ENCOUNTER — Ambulatory Visit: Admitting: Licensed Clinical Social Worker

## 2023-11-06 ENCOUNTER — Other Ambulatory Visit (HOSPITAL_BASED_OUTPATIENT_CLINIC_OR_DEPARTMENT_OTHER): Payer: Self-pay

## 2023-11-06 ENCOUNTER — Other Ambulatory Visit: Payer: Self-pay | Admitting: Internal Medicine

## 2023-11-06 DIAGNOSIS — K219 Gastro-esophageal reflux disease without esophagitis: Secondary | ICD-10-CM

## 2023-11-06 MED ORDER — ESOMEPRAZOLE MAGNESIUM 40 MG PO CPDR
40.0000 mg | DELAYED_RELEASE_CAPSULE | Freq: Every day | ORAL | 0 refills | Status: DC
  Filled 2023-11-06: qty 90, 45d supply, fill #0

## 2023-11-08 ENCOUNTER — Encounter: Payer: Self-pay | Admitting: Gastroenterology

## 2023-11-08 ENCOUNTER — Ambulatory Visit (INDEPENDENT_AMBULATORY_CARE_PROVIDER_SITE_OTHER)

## 2023-11-08 DIAGNOSIS — Z23 Encounter for immunization: Secondary | ICD-10-CM

## 2023-11-08 NOTE — Progress Notes (Signed)
 After obtaining consent, and per orders of Dr. Joshua, injection of Hep B#2 given by Edsel CHRISTELLA Kerns. Patient tolerated well.

## 2023-11-14 ENCOUNTER — Other Ambulatory Visit (HOSPITAL_COMMUNITY): Payer: Self-pay

## 2023-11-16 ENCOUNTER — Encounter: Payer: Self-pay | Admitting: Gastroenterology

## 2023-11-16 ENCOUNTER — Other Ambulatory Visit (HOSPITAL_BASED_OUTPATIENT_CLINIC_OR_DEPARTMENT_OTHER): Payer: Self-pay

## 2023-11-16 ENCOUNTER — Ambulatory Visit: Admitting: Gastroenterology

## 2023-11-16 VITALS — BP 131/90 | HR 90 | Temp 97.9°F | Resp 12 | Ht 65.0 in | Wt 221.0 lb

## 2023-11-16 DIAGNOSIS — B3781 Candidal esophagitis: Secondary | ICD-10-CM | POA: Diagnosis not present

## 2023-11-16 DIAGNOSIS — K219 Gastro-esophageal reflux disease without esophagitis: Secondary | ICD-10-CM | POA: Diagnosis not present

## 2023-11-16 DIAGNOSIS — K317 Polyp of stomach and duodenum: Secondary | ICD-10-CM | POA: Diagnosis not present

## 2023-11-16 DIAGNOSIS — K3189 Other diseases of stomach and duodenum: Secondary | ICD-10-CM

## 2023-11-16 DIAGNOSIS — R1010 Upper abdominal pain, unspecified: Secondary | ICD-10-CM

## 2023-11-16 DIAGNOSIS — K319 Disease of stomach and duodenum, unspecified: Secondary | ICD-10-CM | POA: Diagnosis not present

## 2023-11-16 MED ORDER — NYSTATIN 100000 UNIT/ML MT SUSP: 5.0000 mL | Freq: Four times a day (QID) | OROMUCOSAL | 0 refills | Status: AC

## 2023-11-16 MED ORDER — SODIUM CHLORIDE 0.9 % IV SOLN: 500.0000 mL | Freq: Once | INTRAVENOUS | Status: DC

## 2023-11-16 MED ORDER — FLUCONAZOLE 200 MG PO TABS: 200.0000 mg | ORAL_TABLET | Freq: Every day | ORAL | 0 refills | Status: DC

## 2023-11-16 MED ORDER — NYSTATIN 100000 UNIT/ML MT SUSP: 5.0000 mL | Freq: Four times a day (QID) | OROMUCOSAL | 0 refills | Status: DC

## 2023-11-16 NOTE — Progress Notes (Signed)
 [facility] History and Physical   Primary Care Physician:  Joshua Debby CROME, MD   Reason for Procedure:   Upper abdominal pain  Plan:    EGD     HPI: Rhonda [name] is a 53 y.o. female  here for EGD to evaluate abdominal pain. At time of office visit in recent months, was mostly periumbilical. CT scan done and negative. Pain has changed and now more so upper abdomen. EGD to further evaluate. History of GERD, she takes nexium  40mg , in recent weeks asked to increase to BID dosing.   Otherwise feels well without any cardiopulmonary symptoms.   I have discussed risks / benefits of anesthesia and endoscopic procedure with Elverda J Tinner and they wish to proceed with the exams as outlined today.    Past Medical History:  Diagnosis Date   Allergic rhinitis    Allergy    B12 deficiency    Carpal tunnel syndrome    Dysfunction of eustachian tube    GERD (gastroesophageal reflux disease)    Migraines     Past Surgical History:  Procedure Laterality Date   BUNIONECTOMY     right    CHOLECYSTECTOMY N/A 09/25/2015   Procedure: LAPAROSCOPIC CHOLECYSTECTOMY WITH INTRAOPERATIVE CHOLANGIOGRAM;  Surgeon: Alm Angle, MD;  Location: [facility];  Service: General;  Laterality: N/A;   TONSILLECTOMY      Prior to Admission medications   Medication Sig Start Date End Date Taking? Authorizing Provider  esomeprazole  (NEXIUM ) 40 MG capsule Take 1 capsule (40 mg total) by mouth daily at 12 noon. Can increase to twice a day if needed 11/06/23  Yes Joshua Debby CROME, MD  acetaminophen  (TYLENOL ) 325 MG tablet Take 325 mg by mouth every 4 (four) hours as needed for fever.    [provider]  ALPRAZolam  (XANAX ) 0.5 MG tablet Take 1 tablet (0.5 mg total) by mouth 2 (two) times daily as needed for anxiety. 07/05/23   Geofm Glade JINNY, MD  cyanocobalamin  2000 MCG tablet Take 1 tablet (2,000 mcg total) by mouth daily. 10/09/23   Joshua Debby CROME, MD  OIL OF OREGANO PO Take 6,000 mg by mouth  daily.    [provider]  ondansetron  (ZOFRAN -ODT) 4 MG disintegrating tablet Take 1 tablet (4 mg total) by mouth every 8 (eight) hours as needed for nausea or vomiting. 09/14/23   Milah Recht, Elspeth SQUIBB, MD  Peppermint Oil (IBGARD) 90 MG CPCR Take as directed 09/14/23   Terisha Losasso, Elspeth SQUIBB, MD  topiramate  (TOPAMAX ) 25 MG tablet Take 1 tablet (25 mg total) by mouth at bedtime. 08/11/23   Ines Onetha NOVAK, MD  Vilazodone  HCl (VIIBRYD ) 10 MG TABS Take 1 tablet (10 mg total) by mouth daily. 10/09/23   Joshua Debby CROME, MD  Vilazodone  HCl (VIIBRYD ) 20 MG TABS Take 1 tablet (20 mg total) by mouth daily. 10/09/23   Joshua Debby CROME, MD    Current Outpatient Medications  Medication Sig Dispense Refill   esomeprazole  (NEXIUM ) 40 MG capsule Take 1 capsule (40 mg total) by mouth daily at 12 noon. Can increase to twice a day if needed 90 capsule 0   acetaminophen  (TYLENOL ) 325 MG tablet Take 325 mg by mouth every 4 (four) hours as needed for fever.     ALPRAZolam  (XANAX ) 0.5 MG tablet Take 1 tablet (0.5 mg total) by mouth 2 (two) times daily as needed for anxiety. 20 tablet 0   cyanocobalamin  2000 MCG tablet Take 1 tablet (2,000 mcg total) by mouth daily. 90  tablet 0   OIL OF OREGANO PO Take 6,000 mg by mouth daily.     ondansetron  (ZOFRAN -ODT) 4 MG disintegrating tablet Take 1 tablet (4 mg total) by mouth every 8 (eight) hours as needed for nausea or vomiting. 20 tablet 1   Peppermint Oil (IBGARD) 90 MG CPCR Take as directed     topiramate  (TOPAMAX ) 25 MG tablet Take 1 tablet (25 mg total) by mouth at bedtime. 30 tablet 6   Vilazodone  HCl (VIIBRYD ) 10 MG TABS Take 1 tablet (10 mg total) by mouth daily. 7 tablet 0   Vilazodone  HCl (VIIBRYD ) 20 MG TABS Take 1 tablet (20 mg total) by mouth daily. 7 tablet 0   Current Facility-Administered Medications  Medication Dose Route Frequency Provider Last Rate Last Admin   0.9 %  sodium chloride  infusion  500 mL Intravenous Once Kaula Klenke, Elspeth SQUIBB, MD         Allergies as of 11/16/2023 - Review Complete 11/16/2023  Allergen Reaction Noted   Amoxicillin  Hives and Rash 12/28/2010   Bactrim  ds [sulfamethoxazole -trimethoprim ] Other (See Comments) 02/06/2017   Cephalosporins Hives 02/03/2017   Fluconazole  Rash 07/30/2019   Oseltamivir phosphate Swelling 01/05/2009    Family History  Problem Relation Age of Onset   Hyperlipidemia Mother    Hypertension Mother    Heart disease Father    Hypertension Father    Diabetes Father    Diabetes Brother    Kidney cancer Brother 74   Diabetes Brother    Heart disease Maternal Grandmother    Headache Neg Hx    Colon cancer Neg Hx    Prostate cancer Neg Hx    Rectal cancer Neg Hx    Stomach cancer Neg Hx    Esophageal cancer Neg Hx     Social History   Socioeconomic History   Marital status: Single    Spouse name: Not on file   Number of children: 0   Years of education: Assoc   Highest education level: Not on file  Occupational History   Occupation: Corporate investment banker  Tobacco Use   Smoking status: Never   Smokeless tobacco: Never  Vaping Use   Vaping status: Never Used  Substance and Sexual Activity   Alcohol use: No   Drug use: No   Sexual activity: Not Currently  Other Topics Concern   Not on file  Social History Narrative   College grad   Single   Bought her own home in '09   Occupation: Doctor, general practice and cleaning   Fun: Resting, yard work   Denies religious beliefs effecting health care.    Drinks caffeine daily    Social Drivers of Corporate investment banker Strain: Not on file  Food Insecurity: Not on file  Transportation Needs: Not on file  Physical Activity: Not on file  Stress: Not on file  Social Connections: Not on file  Intimate Partner Violence: Not on file    Review of Systems: All other review of systems negative except as mentioned in the HPI.  Physical Exam: Vital signs BP 129/80   Pulse 89   Temp 97.9 F (36.6 C) (Temporal)   Ht 5' 5 (1.651  m)   Wt 221 lb (100.2 kg)   SpO2 99%   BMI 36.78 kg/m   General:   Alert,  Well-developed, pleasant and cooperative in NAD Lungs:  Clear throughout to auscultation.   Heart:  Regular rate and rhythm Abdomen:  Soft, nontender and nondistended.   Neuro/Psych:  Alert and cooperative. Normal mood and affect. A and O x 3  Marcey Naval, MD [facility]

## 2023-11-16 NOTE — Op Note (Addendum)
 [facility] Patient Name: Weston Fulco Procedure Date: 11/16/2023 3:32 PM MRN: [id number] Endoscopist: Elspeth P. Leigh , MD, [id number] Age: 53 Referring MD:  Date of Birth: 01-May-1970 Gender: Female Account #: [id number] Procedure:                Upper GI endoscopy Indications:              Upper abdominal pain - negative CT scan, s/p                            cholecystectomy, no improvement on higher dose                            nexium  - persistent upper abdominal discomfort. EGD                            to further evaluate. Medicines:                Monitored Anesthesia Care Procedure:                Pre-Anesthesia Assessment:                           - Prior to the procedure, a History and Physical                            was performed, and patient medications and                            allergies were reviewed. The patient's tolerance of                            previous anesthesia was also reviewed. The risks                            and benefits of the procedure and the sedation                            options and risks were discussed with the patient.                            All questions were answered, and informed consent                            was obtained. Prior Anticoagulants: The patient has                            taken no anticoagulant or antiplatelet agents. ASA                            Grade Assessment: II - A patient with mild systemic                            disease. After reviewing the risks and benefits,  the patient was deemed in satisfactory condition to                            undergo the procedure.                           After obtaining informed consent, the endoscope was                            passed under direct vision. Throughout the                            procedure, the patient's blood pressure, pulse, and                            oxygen saturations were  monitored continuously. The                            Olympus Scope [id number] was introduced through the                            mouth, and advanced to the second part of duodenum.                            The upper GI endoscopy was accomplished without                            difficulty. The patient tolerated the procedure                            well. Scope In: Scope Out: Findings:                 Esophagogastric landmarks were identified: the                            Z-line was found at 39 cm, the gastroesophageal                            junction was found at 39 cm and the upper extent of                            the gastric folds was found at 39 cm from the                            incisors.                           Multiple small plaques were found in the upper                            third of the esophagus and in the middle third of                            the  esophagus - grossly consistent with esophageal                            candidiasis.                           The exam of the esophagus was otherwise normal.                           A few small sessile polyps were found in the                            gastric fundus and in the gastric body, grossly                            consistent with benign fundic gland polyps.                            Previously biopsied in 2021, no further biopsies                            today.                           The exam of the stomach was otherwise normal.                           Biopsies were taken with a cold forceps for                            Helicobacter pylori testing.                           The examined duodenum was normal. Complications:            No immediate complications. Estimated blood loss:                            Minimal. Estimated Blood Loss:     Estimated blood loss was minimal. Impression:               - Esophagogastric landmarks identified.                            - Mild esophageal candidiasis.                           - Normal esophagus otherwise.                           - A few gastric polyps. Previously biopsied on last                            EGD, benign fundic gland polyps.                           - Normal stomach otherwise - biopsies taken  to rule                            out H pylori.                           - Normal examined duodenum.                           No ulcers / obvious gastritis. Biopsies taken to                            rule out H pylori. Candidiasis present and will                            treat that, but would be atypical to cause her                            current symptoms. Musculoskeletal pain remains                            possible given negative imaging / labs otherwise. Recommendation:           - Patient has a contact number available for                            emergencies. The signs and symptoms of potential                            delayed complications were discussed with the                            patient. Return to normal activities tomorrow.                            Written discharge instructions were provided to the                            patient.                           - Resume previous diet.                           - Continue present medications.                           - Was going to treat candidiasis with fluconazole                             but she has had a blistering rash with that in the                            past. As an alternative will use Nystatin PO  400,000 to 600,000 units 4 times daily; swish in                            the mouth and swallow. Duration is for 14 days                           - Await pathology results. Elspeth P. Mannix Kroeker, MD 11/16/2023 3:51:04 PM This report has been signed electronically.

## 2023-11-16 NOTE — Progress Notes (Signed)
 Pt's states no medical or surgical changes since previsit or office visit.

## 2023-11-16 NOTE — Progress Notes (Signed)
 Report given to PACU, vss

## 2023-11-16 NOTE — Progress Notes (Signed)
1531 Robinul 0.1 mg IV given due large amount of secretions upon assessment.  MD made aware, vss  

## 2023-11-16 NOTE — Patient Instructions (Addendum)
 Please read handouts provided. Continue present medications. Resume previous diet. Nystatin suspension 500,000 units 4 times daily, swish in the mouth and swallow. Duration for 14 days. Await pathology results.  YOU HAD AN ENDOSCOPIC PROCEDURE TODAY AT THE [facility]:   Refer to the procedure report that was given to you for any specific questions about what was found during the examination.  If the procedure report does not answer your questions, please call your gastroenterologist to clarify.  If you requested that your care partner not be given the details of your procedure findings, then the procedure report has been included in a sealed envelope for you to review at your convenience later.  YOU SHOULD EXPECT: Some feelings of bloating in the abdomen. Passage of more gas than usual.  Walking can help get rid of the air that was put into your GI tract during the procedure and reduce the bloating. If you had a lower endoscopy (such as a colonoscopy or flexible sigmoidoscopy) you may notice spotting of blood in your stool or on the toilet paper. If you underwent a bowel prep for your procedure, you may not have a normal bowel movement for a few days.  Please Note:  You might notice some irritation and congestion in your nose or some drainage.  This is from the oxygen used during your procedure.  There is no need for concern and it should clear up in a day or so.  SYMPTOMS TO REPORT IMMEDIATELY:  Following upper endoscopy (EGD)  Vomiting of blood or coffee ground material  New chest pain or pain under the shoulder blades  Painful or persistently difficult swallowing  New shortness of breath  Fever of 100F or higher  Black, tarry-looking stools  For urgent or emergent issues, a gastroenterologist can be reached at any hour by calling [phone]. Do not use MyChart messaging for urgent concerns.    DIET:  We do recommend a small meal at first, but then you may proceed to  your regular diet.  Drink plenty of fluids but you should avoid alcoholic beverages for 24 hours.  ACTIVITY:  You should plan to take it easy for the rest of today and you should NOT DRIVE or use heavy machinery until tomorrow (because of the sedation medicines used during the test).    FOLLOW UP: Our staff will call the number listed on your records the next business day following your procedure.  We will call around 7:15- 8:00 am to check on you and address any questions or concerns that you may have regarding the information given to you following your procedure. If we do not reach you, we will leave a message.     If any biopsies were taken you will be contacted by phone or by letter within the next 1-3 weeks.  Please call us  at [phone] if you have not heard about the biopsies in 3 weeks.    SIGNATURES/CONFIDENTIALITY: You and/or your care partner have signed paperwork which will be entered into your electronic medical record.  These signatures attest to the fact that that the information above on your After Visit Summary has been reviewed and is understood.  Full responsibility of the confidentiality of this discharge information lies with you and/or your care-partner.

## 2023-11-16 NOTE — Progress Notes (Signed)
 Called to room to assist during endoscopic procedure.  Patient ID and intended procedure confirmed with present staff. Received instructions for my participation in the procedure from the performing physician.

## 2023-11-17 ENCOUNTER — Telehealth: Payer: Self-pay

## 2023-11-17 NOTE — Telephone Encounter (Signed)
  Follow up Call-     11/16/2023    3:09 PM  Call back number  Post procedure Call Back phone  # [phone]  Permission to leave phone message Yes     Patient questions:  Do you have a fever, pain , or abdominal swelling? No. Pain Score  0 *  Have you tolerated food without any problems? Yes.    Have you been able to return to your normal activities? Yes.    Do you have any questions about your discharge instructions: Diet   No. Medications  No. Follow up visit  No.  Do you have questions or concerns about your Care? No.  Actions: * If pain score is 4 or above: No action needed, pain <4.

## 2023-11-21 ENCOUNTER — Ambulatory Visit: Payer: Self-pay

## 2023-11-21 LAB — SURGICAL PATHOLOGY

## 2023-11-21 NOTE — Telephone Encounter (Signed)
 FYI Only or Action Required?: FYI only for provider.  Patient was last seen in primary care on 10/09/2023 by Joshua Debby CROME, MD.  Called Nurse Triage reporting Abdominal Pain.  Symptoms began x 4 months and worsening.  Interventions attempted: Nothing.  Symptoms are: gradually worsening.  Triage Disposition: See Physician Within 24 Hours  Patient/caregiver understands and will follow disposition?: Yes   Copied from CRM [id number]. Topic: Clinical - Red Word Triage >> Nov 21, 2023  4:27 PM Rhonda [name] wrote: Red Word that prompted transfer to Nurse Triage: Patient is having pretty sharp pain on the left side under the breast where the diaphragm is and goes around to her back. Reason for Disposition  [1] MODERATE pain (e.g., interferes with normal activities) AND [2] pain comes and goes (cramps) AND [3] present > 24 hours  (Exception: Pain with Vomiting or Diarrhea - see that Guideline.)  Answer Assessment - Initial Assessment Questions 1. LOCATION: Where does it hurt?      Left upper abd / diapragh area  2. RADIATION: Does the pain shoot anywhere else? (e.g., chest, back)     Back  3. ONSET: When did the pain begin? (e.g., minutes, hours or days ago)      4 weeks  4. SUDDEN: Gradual or sudden onset?     Gradual and worsening 5. PATTERN Does the pain come and go, or is it constant?     constant 6. SEVERITY: How bad is the pain?  (e.g., Scale 1-10; mild, moderate, or severe)     4.5/10 7. RECURRENT SYMPTOM: Have you ever had this type of stomach pain before? If Yes, ask: When was the last time? and What happened that time?      na 8. CAUSE: What do you think is causing the stomach pain? (e.g., gallstones, recent abdominal surgery)     na 9. RELIEVING/AGGRAVATING FACTORS: What makes it better or worse? (e.g., antacids, bending or twisting motion, bowel movement)     na 10. OTHER SYMPTOMS: Do you have any other symptoms? (e.g., back pain, diarrhea, fever,  urination pain, vomiting)       Back pain, nausea, abd bloating, firmness to abd, history of reflux 11. PREGNANCY: Is there any chance you are pregnant? When was your last menstrual period?       Na  Perimenopausal stage  Protocols used: Abdominal Pain - Female-A-AH

## 2023-11-22 ENCOUNTER — Ambulatory Visit: Payer: Self-pay | Admitting: Gastroenterology

## 2023-11-22 ENCOUNTER — Other Ambulatory Visit: Payer: Self-pay | Admitting: Internal Medicine

## 2023-11-22 ENCOUNTER — Ambulatory Visit: Admitting: Internal Medicine

## 2023-11-22 VITALS — BP 108/62 | HR 98 | Temp 98.3°F | Ht 65.0 in | Wt 225.2 lb

## 2023-11-22 DIAGNOSIS — E538 Deficiency of other specified B group vitamins: Secondary | ICD-10-CM

## 2023-11-22 DIAGNOSIS — R1013 Epigastric pain: Secondary | ICD-10-CM | POA: Diagnosis not present

## 2023-11-22 DIAGNOSIS — Z566 Other physical and mental strain related to work: Secondary | ICD-10-CM | POA: Diagnosis not present

## 2023-11-22 DIAGNOSIS — R0789 Other chest pain: Secondary | ICD-10-CM | POA: Diagnosis not present

## 2023-11-22 MED ORDER — CELECOXIB 200 MG PO CAPS: 200.0000 mg | ORAL_CAPSULE | Freq: Two times a day (BID) | ORAL | 1 refills | Status: AC

## 2023-11-22 MED ORDER — CYANOCOBALAMIN 1000 MCG/ML IJ SOLN
1000.0000 ug | Freq: Once | INTRAMUSCULAR | Status: AC
  Administered 2023-11-22: 1000 ug via INTRAMUSCULAR

## 2023-11-22 MED ORDER — KETOROLAC TROMETHAMINE 60 MG/2ML IM SOLN
60.0000 mg | Freq: Once | INTRAMUSCULAR | Status: AC
  Administered 2023-11-22: 60 mg via INTRAMUSCULAR

## 2023-11-22 MED ORDER — CYCLOBENZAPRINE HCL 5 MG PO TABS: 5.0000 mg | ORAL_TABLET | Freq: Every evening | ORAL | 1 refills | Status: DC | PRN

## 2023-11-22 NOTE — Assessment & Plan Note (Signed)
 Discussed Cont on Viibrid

## 2023-11-22 NOTE — Assessment & Plan Note (Signed)
 Re-start B12 B12 1000 mcg sq given Risks associated with treatment noncompliance were discussed. Compliance was encouraged.

## 2023-11-22 NOTE — Assessment & Plan Note (Signed)
 LUQ/epigastrium x 1 month 5/10, worse w/movements, breathing Tylenol , 800 mg x2 doses did not help GI w/up (EGD, abd CT is (-)) C/o stress Likely MSK pain Celebrex po Rx; Flexeril at hs Toradol  IM now Use heat D-dimer test RTC in 4 wks to see Dr Joshua

## 2023-11-22 NOTE — Progress Notes (Signed)
 Subjective:  Patient ID: Rhonda [name], female    DOB: 11-Jun-1970  Age: 53 y.o. MRN: [id number]  CC: Abdominal Pain (Abdominal pain)   HPI OLITA TAKESHITA presents for pain in the LUQ/epigastrium x 1 month 5/10, worse w/movements, breathing Tylenol , 800 mg x2 doses did not help GI w/up (EGD, abd CT is (-)) C/o stress  Outpatient Medications Prior to Visit  Medication Sig Dispense Refill   acetaminophen  (TYLENOL ) 325 MG tablet Take 325 mg by mouth every 4 (four) hours as needed for fever.     ALPRAZolam  (XANAX ) 0.5 MG tablet Take 1 tablet (0.5 mg total) by mouth 2 (two) times daily as needed for anxiety. 20 tablet 0   cyanocobalamin  2000 MCG tablet Take 1 tablet (2,000 mcg total) by mouth daily. 90 tablet 0   esomeprazole  (NEXIUM ) 40 MG capsule Take 1 capsule (40 mg total) by mouth daily at 12 noon. Can increase to twice a day if needed 90 capsule 0   nystatin (MYCOSTATIN) 100000 UNIT/ML suspension Take 5 mLs (500,000 Units total) by mouth 4 (four) times daily for 14 days. 280 mL 0   OIL OF OREGANO PO Take 6,000 mg by mouth daily.     ondansetron  (ZOFRAN -ODT) 4 MG disintegrating tablet Take 1 tablet (4 mg total) by mouth every 8 (eight) hours as needed for nausea or vomiting. 20 tablet 1   Peppermint Oil (IBGARD) 90 MG CPCR Take as directed     topiramate  (TOPAMAX ) 25 MG tablet Take 1 tablet (25 mg total) by mouth at bedtime. 30 tablet 6   Vilazodone  HCl (VIIBRYD ) 10 MG TABS Take 1 tablet (10 mg total) by mouth daily. 7 tablet 0   Vilazodone  HCl (VIIBRYD ) 20 MG TABS Take 1 tablet (20 mg total) by mouth daily. 7 tablet 0   No facility-administered medications prior to visit.    ROS: Review of Systems  Constitutional:  Positive for fatigue. Negative for activity change, appetite change, chills and unexpected weight change.  HENT:  Negative for congestion, mouth sores and sinus pressure.   Eyes:  Negative for visual disturbance.  Respiratory:  Negative for cough and chest tightness.    Gastrointestinal:  Positive for abdominal pain. Negative for nausea.  Genitourinary:  Negative for difficulty urinating, frequency, urgency and vaginal pain.  Musculoskeletal:  Negative for back pain and gait problem.  Skin:  Negative for pallor and rash.  Neurological:  Negative for dizziness, tremors, weakness, numbness and headaches.  Psychiatric/Behavioral:  Positive for dysphoric mood and sleep disturbance. Negative for confusion and suicidal ideas. The patient is nervous/anxious.     Objective:  BP 108/62   Pulse 98   Temp 98.3 F (36.8 C)   Ht 5' 5 (1.651 m)   Wt 225 lb 3.2 oz (102.2 kg)   SpO2 99%   BMI 37.48 kg/m   BP Readings from Last 3 Encounters:  11/22/23 108/62  11/16/23 (!) 131/90  10/10/23 127/79    Wt Readings from Last 3 Encounters:  11/22/23 225 lb 3.2 oz (102.2 kg)  11/16/23 221 lb (100.2 kg)  10/10/23 225 lb (102.1 kg)    Physical Exam Constitutional:      General: She is not in acute distress.    Appearance: She is well-developed. She is obese.  HENT:     Head: Normocephalic.     Right Ear: External ear normal.     Left Ear: External ear normal.     Nose: Nose normal.  Eyes:  General:        Right eye: No discharge.        Left eye: No discharge.     Conjunctiva/sclera: Conjunctivae normal.     Pupils: Pupils are equal, round, and reactive to light.  Neck:     Thyroid : No thyromegaly.     Vascular: No JVD.     Trachea: No tracheal deviation.  Cardiovascular:     Rate and Rhythm: Normal rate and regular rhythm.     Heart sounds: Normal heart sounds.  Pulmonary:     Effort: No respiratory distress.     Breath sounds: No stridor. No wheezing.  Abdominal:     General: Bowel sounds are normal. There is no distension.     Palpations: Abdomen is soft. There is no mass.     Tenderness: There is abdominal tenderness. There is no guarding or rebound.  Musculoskeletal:        General: No tenderness.     Cervical back: Normal range of  motion and neck supple. No rigidity.  Lymphadenopathy:     Cervical: No cervical adenopathy.  Skin:    Findings: No erythema or rash.  Neurological:     Cranial Nerves: No cranial nerve deficit.     Motor: No abnormal muscle tone.     Coordination: Coordination normal.     Deep Tendon Reflexes: Reflexes normal.  Psychiatric:        Behavior: Behavior normal.        Thought Content: Thought content normal.        Judgment: Judgment normal.     Abd muscles over epigastrium - tender NAD Epigastric muscles are tender to palpation No hernia, no rash, no mass  Lab Results  Component Value Date   WBC 8.0 09/14/2023   HGB 13.6 09/14/2023   HCT 40.0 09/14/2023   PLT 423.0 (H) 09/14/2023   GLUCOSE 100 (H) 09/14/2023   CHOL 202 (H) 07/12/2023   TRIG 64.0 07/12/2023   HDL 65.00 07/12/2023   LDLCALC 124 (H) 07/12/2023   ALT 13 09/14/2023   AST 14 09/14/2023   NA 139 09/14/2023   K 3.8 09/14/2023   CL 101 09/14/2023   CREATININE 0.87 09/14/2023   BUN 12 09/14/2023   CO2 29 09/14/2023   TSH 0.73 07/12/2023   HGBA1C 5.5 07/12/2023    CT ABDOMEN PELVIS W CONTRAST Result Date: 09/25/2023 EXAM: CT ABDOMEN AND PELVIS WITH CONTRAST 09/19/2023 04:45:00 PM TECHNIQUE: CT of the abdomen and pelvis was performed with the administration of intravenous contrast (100mL iohexol  (OMNIPAQUE ) 300 MG/ML solution). Multiplanar reformatted images are provided for review. Automated exposure control, iterative reconstruction, and/or weight-based adjustment of the mA/kV was utilized to reduce the radiation dose to as low as reasonably achievable. COMPARISON: Abdominal ultrasound dated 08/27/2005. CLINICAL HISTORY: Periumbilical and lower abdominal pain; nausea without vomiting; lower left abdominal pain for 1 month; constipation; feels sick after eating; status post cholecystectomy; no history of cancer. FINDINGS: LOWER CHEST: Radicular opacities in the posterior periphery of the lower lobes bilaterally.  LIVER: Normal size and contour. GALLBLADDER AND BILE DUCTS: Patient is status post cholecystectomy. SPLEEN: Normal size. No focal lesion. PANCREAS: No mass. No ductal dilatation. ADRENAL GLANDS: Normal appearance. No mass. KIDNEYS, URETERS AND BLADDER: No stones in the kidneys or ureters. No hydronephrosis. No perinephric or periureteral stranding. Urinary bladder is unremarkable. GI AND BOWEL: Stomach demonstrates no acute abnormality. There is no bowel obstruction. No bowel wall thickening. PERITONEUM AND RETROPERITONEUM: No ascites. No free  air. VASCULATURE: Aorta is normal in caliber. LYMPH NODES: Several shotty mesenteric lymph nodes present. REPRODUCTIVE ORGANS: There is a fibroid in the uterine fundus measuring about 15 mm in diameter. BONES AND SOFT TISSUES: No acute osseous abnormality. No focal soft tissue abnormality. IMPRESSION: 1. No acute findings in the abdomen or pelvis. Electronically signed by: Evalene Coho MD 09/25/2023 03:00 PM EDT RP Workstation: [id number]    Assessment & Plan:   Problem List Items Addressed This Visit     B12 deficiency   Re-start B12 B12 1000 mcg sq given Risks associated with treatment noncompliance were discussed. Compliance was encouraged.       Chest pain, atypical - Primary   LUQ/epigastrium x 1 month 5/10, worse w/movements, breathing Tylenol , 800 mg x2 doses did not help GI w/up (EGD, abd CT is (-)) C/o stress Likely MSK pain Celebrex po Rx; Flexeril at hs Toradol  IM now Use heat D-dimer test RTC in 4 wks to see Dr Joshua      Relevant Orders   D-dimer, quantitative   Epigastric pain   LUQ/epigastrium x 1 month 5/10, worse w/movements, breathing Tylenol , 800 mg x2 doses did not help GI w/up (EGD, abd CT is (-)) C/o stress Likely MSK pain Celebrex po Rx; Flexeril at hs Toradol  IM now Use heat D-dimer test RTC in 4 wks to see Dr Joshua      Stress at work   Discussed Cont on Viibrid         Meds ordered this encounter   Medications   celecoxib (CELEBREX) 200 MG capsule    Sig: Take 1 capsule (200 mg total) by mouth 2 (two) times daily.    Dispense:  60 capsule    Refill:  1   cyclobenzaprine (FLEXERIL) 5 MG tablet    Sig: Take 1-2 tablets (5-10 mg total) by mouth at bedtime as needed for muscle spasms.    Dispense:  60 tablet    Refill:  1   ketorolac  (TORADOL ) injection 60 mg   cyanocobalamin  (VITAMIN B12) injection 1,000 mcg      Follow-up: Return in about 4 weeks (around 12/20/2023) for f/u with PCP.  Marolyn Noel, MD

## 2023-11-22 NOTE — Patient Instructions (Signed)

## 2023-11-23 DIAGNOSIS — R0789 Other chest pain: Secondary | ICD-10-CM | POA: Diagnosis not present

## 2023-11-24 LAB — D-DIMER, QUANTITATIVE: D-Dimer, Quant: 0.32 ug{FEU}/mL (ref <0.50)

## 2023-11-25 ENCOUNTER — Ambulatory Visit: Payer: Self-pay | Admitting: Internal Medicine

## 2023-11-27 ENCOUNTER — Encounter: Payer: Self-pay | Admitting: Radiology

## 2023-11-29 NOTE — Telephone Encounter (Signed)
 Letter mailed to patient with appointment info

## 2023-12-26 ENCOUNTER — Ambulatory Visit: Admitting: Internal Medicine

## 2023-12-26 ENCOUNTER — Encounter: Payer: Self-pay | Admitting: Internal Medicine

## 2023-12-26 VITALS — BP 104/76 | HR 87 | Temp 98.2°F | Resp 16 | Ht 65.0 in | Wt 223.6 lb

## 2023-12-26 DIAGNOSIS — E538 Deficiency of other specified B group vitamins: Secondary | ICD-10-CM

## 2023-12-26 DIAGNOSIS — K219 Gastro-esophageal reflux disease without esophagitis: Secondary | ICD-10-CM | POA: Diagnosis not present

## 2023-12-26 LAB — CBC WITH DIFFERENTIAL/PLATELET
Basophils Absolute: 0 K/uL (ref 0.0–0.1)
Basophils Relative: 0.7 % (ref 0.0–3.0)
Eosinophils Absolute: 0.1 K/uL (ref 0.0–0.7)
Eosinophils Relative: 1.7 % (ref 0.0–5.0)
HCT: 39.4 % (ref 36.0–46.0)
Hemoglobin: 13.5 g/dL (ref 12.0–15.0)
Lymphocytes Relative: 32.9 % (ref 12.0–46.0)
Lymphs Abs: 2.2 K/uL (ref 0.7–4.0)
MCHC: 34.2 g/dL (ref 30.0–36.0)
MCV: 87.4 fl (ref 78.0–100.0)
Monocytes Absolute: 0.5 K/uL (ref 0.1–1.0)
Monocytes Relative: 8.1 % (ref 3.0–12.0)
Neutro Abs: 3.8 K/uL (ref 1.4–7.7)
Neutrophils Relative %: 56.6 % (ref 43.0–77.0)
Platelets: 436 K/uL — ABNORMAL HIGH (ref 150.0–400.0)
RBC: 4.51 Mil/uL (ref 3.87–5.11)
RDW: 13.6 % (ref 11.5–15.5)
WBC: 6.7 K/uL (ref 4.0–10.5)

## 2023-12-26 MED ORDER — CYANOCOBALAMIN 1000 MCG/ML IJ SOLN
1000.0000 ug | Freq: Once | INTRAMUSCULAR | Status: AC
  Administered 2023-12-26: 1000 ug via INTRAMUSCULAR

## 2023-12-26 NOTE — Patient Instructions (Signed)
 High Number of Platelets (Essential Thrombocythemia): What to Know  Essential thrombocythemia means that you have a high number of platelets. Platelets are cells in your blood. When you bleed, they clump together as a clot to stop the bleeding. This condition may also be called primary thrombocytosis when the increase in platelets happens because the bone marrow is making too many platelets. What are the causes? The cause of essential thrombocythemia is not known. What are the signs or symptoms? Symptoms of this condition include: Blood clots. Weakness. Headache. Itching. Dizziness. Confusion. Tingling or burning in your hands or feet. Other symptoms may be: Sweating or fever. Bleeding. Pain in the belly due to alarge spleen. How is this diagnosed? You may be diagnosed with a physical exam.  You may also have tests, such as: Blood tests. A biopsy. This is when a small piece of bone marrow is removed for testing. An ultrasound or CT scan of your belly. How is this treated? You may not need treatment if you don't have symptoms. Your health care provider will watch your health with regular blood tests. If you have symptoms, or if your platelets are very high, you may be treated with: Aspirin  or other medicines to help prevent blood clots. Medicines to reduce the number of platelets in your blood. A procedure to remove some platelets from your blood. During this treatment: An IV will be put into a vein in your hand or arm. The IV will be used to draw blood from your body. The blood will go into a machine to remove extra platelets. The blood with less platelets will be returned to your body. Follow these instructions at home: Medicines  Take your medicines only as told. If you're taking blood thinners: Take your medicines as told. Take them at the same time each day. Talk with your provider before taking any products you can buy at the store. Do not do things that could hurt or  bruise you. Be careful to avoid falls. Wear an alert bracelet or carry a card that says you take blood thinners. General instructions Ask your provider about managing or preventing other health problems, like high cholesterol, high blood pressure, or diabetes. These can make essential thrombocythemia worse. Do not smoke, vape, or use nicotine or tobacco. Tell all providers about any medicines you're taking to prevent blood clots. Tell your dentist about your medicines before you have any dental work or cleanings. Contact a health care provider if: You have bad pain, and medicines don't help. You have problems taking medicines to prevent blood clots. You have new pain, warmth, swelling, or redness in an arm or leg. This could mean you have a blood clot. You faint. You have bruises that you can't explain. You notice more blood or bleeding, such as: Bloody or black, tarry poop. Pink or bloody pee. Nosebleeds or bleeding gums. Monthly periods that are heavier than normal. Get help right away if: You have chest pain. You have trouble breathing. You have any signs of a stroke. BE FAST is an easy way to remember the main warning signs: B - Balance. Feeling dizzy, sudden trouble walking, or loss of balance. E - Eyes. Trouble seeing or a change in how you see. F - Face. Sudden weakness or feeling numb in the face. The face or eyelid may droop on one side. A - Arm. Weakness or loss of feeling in an arm. This happens fast and often only on one side. S - Speech. Sudden trouble speaking,  slurred speech, or trouble understanding what people say. T - Time. Time to call 911. Write down what time symptoms started. Other signs of a stroke can be: A sudden, very bad headache with no known cause. Throwing up or feeling like you may throw up. These symptoms may be an emergency. Call 911 right away. Do not wait to see if the symptoms will go away. Do not drive yourself to the hospital. This information  is not intended to replace advice given to you by your health care provider. Make sure you discuss any questions you have with your health care provider. Document Revised: 03/01/2023 Document Reviewed: 03/01/2023 Elsevier Patient Education  2025 [employer].

## 2023-12-26 NOTE — Progress Notes (Unsigned)
 Subjective:  Patient ID: Rhonda [name], female    DOB: Feb 09, 1970  Age: 53 y.o. MRN: [id number]  CC: Gastroesophageal Reflux   HPI Rhonda [name] presents for f/up --  Discussed the use of AI scribe software for clinical note transcription with the patient, who gave verbal consent to proceed.  History of Present Illness Rhonda [name] is a 53 year old female who presents for a follow-up visit and B12 shot.  She feels well today with no weakness, dizziness, or lightheadedness. She was sent home from work due to the inability to accommodate her desk setup but feels great overall. No headaches or dizzy spells recently. She has not been very active, attributing it to 'laziness,' and is concerned about weight gain. She feels bloated, possibly related to her menstrual cycle, which has not occurred since September 13th, indicating menopause.  Her pain is well-controlled, with discomfort only when wearing a girdle. She is not taking any pain medication regularly but uses Tylenol  as needed. She tried a muscle relaxer once but discontinued it due to excessive drowsiness.  She is taking Nexium , although her prescription has run out, she still has some left. She uses Zofran  for nausea and vomiting as needed, in the orally dissolving tablet form. She has not been taking Viibryd  regularly, as she did not feel the need for the increased dose.  She experiences numbness in her hands, particularly at night, and has had night sweats. No bleeding, bruising, coughing, wheezing, fevers, chills, or swollen lymph nodes. Her blood pressure has been low on a few occasions.     Outpatient Medications Prior to Visit  Medication Sig Dispense Refill   acetaminophen  (TYLENOL ) 325 MG tablet Take 325 mg by mouth every 4 (four) hours as needed for fever.     ALPRAZolam  (XANAX ) 0.5 MG tablet Take 1 tablet (0.5 mg total) by mouth 2 (two) times daily as needed for anxiety. 20 tablet 0   celecoxib  (CELEBREX ) 200 MG  capsule Take 1 capsule (200 mg total) by mouth 2 (two) times daily. 60 capsule 1   cyanocobalamin  2000 MCG tablet Take 1 tablet (2,000 mcg total) by mouth daily. 90 tablet 0   esomeprazole  (NEXIUM ) 40 MG capsule Take 1 capsule (40 mg total) by mouth daily at 12 noon. Can increase to twice a day if needed 90 capsule 0   OIL OF OREGANO PO Take 6,000 mg by mouth daily.     ondansetron  (ZOFRAN -ODT) 4 MG disintegrating tablet Take 1 tablet (4 mg total) by mouth every 8 (eight) hours as needed for nausea or vomiting. 20 tablet 1   Peppermint Oil (IBGARD) 90 MG CPCR Take as directed     topiramate  (TOPAMAX ) 25 MG tablet Take 1 tablet (25 mg total) by mouth at bedtime. 30 tablet 6   cyclobenzaprine  (FLEXERIL ) 5 MG tablet Take 1-2 tablets (5-10 mg total) by mouth at bedtime as needed for muscle spasms. 60 tablet 1   Vilazodone  HCl (VIIBRYD ) 10 MG TABS Take 1 tablet (10 mg total) by mouth daily. 7 tablet 0   Vilazodone  HCl (VIIBRYD ) 20 MG TABS Take 1 tablet (20 mg total) by mouth daily. 7 tablet 0   No facility-administered medications prior to visit.    ROS Review of Systems  Objective:  BP 104/76 (BP Location: Left Arm, Patient Position: Sitting, Cuff Size: Normal)   Pulse 87   Temp 98.2 F (36.8 C) (Oral)   Resp 16   Ht 5' 5 (1.651 m)  Wt 223 lb 9.6 oz (101.4 kg)   LMP 10/09/2023 (Approximate)   SpO2 98%   BMI 37.21 kg/m   BP Readings from Last 3 Encounters:  12/26/23 104/76  11/22/23 108/62  11/16/23 (!) 131/90    Wt Readings from Last 3 Encounters:  12/26/23 223 lb 9.6 oz (101.4 kg)  11/22/23 225 lb 3.2 oz (102.2 kg)  11/16/23 221 lb (100.2 kg)    Physical Exam  Lab Results  Component Value Date   WBC 8.0 09/14/2023   HGB 13.6 09/14/2023   HCT 40.0 09/14/2023   PLT 423.0 (H) 09/14/2023   GLUCOSE 100 (H) 09/14/2023   CHOL 202 (H) 07/12/2023   TRIG 64.0 07/12/2023   HDL 65.00 07/12/2023   LDLCALC 124 (H) 07/12/2023   ALT 13 09/14/2023   AST 14 09/14/2023   NA 139  09/14/2023   K 3.8 09/14/2023   CL 101 09/14/2023   CREATININE 0.87 09/14/2023   BUN 12 09/14/2023   CO2 29 09/14/2023   TSH 0.73 07/12/2023   HGBA1C 5.5 07/12/2023    CT ABDOMEN PELVIS W CONTRAST Result Date: 09/25/2023 EXAM: CT ABDOMEN AND PELVIS WITH CONTRAST 09/19/2023 04:45:00 PM TECHNIQUE: CT of the abdomen and pelvis was performed with the administration of intravenous contrast (100mL iohexol  (OMNIPAQUE ) 300 MG/ML solution). Multiplanar reformatted images are provided for review. Automated exposure control, iterative reconstruction, and/or weight-based adjustment of the mA/kV was utilized to reduce the radiation dose to as low as reasonably achievable. COMPARISON: Abdominal ultrasound dated 08/27/2005. CLINICAL HISTORY: Periumbilical and lower abdominal pain; nausea without vomiting; lower left abdominal pain for 1 month; constipation; feels sick after eating; status post cholecystectomy; no history of cancer. FINDINGS: LOWER CHEST: Radicular opacities in the posterior periphery of the lower lobes bilaterally. LIVER: Normal size and contour. GALLBLADDER AND BILE DUCTS: Patient is status post cholecystectomy. SPLEEN: Normal size. No focal lesion. PANCREAS: No mass. No ductal dilatation. ADRENAL GLANDS: Normal appearance. No mass. KIDNEYS, URETERS AND BLADDER: No stones in the kidneys or ureters. No hydronephrosis. No perinephric or periureteral stranding. Urinary bladder is unremarkable. GI AND BOWEL: Stomach demonstrates no acute abnormality. There is no bowel obstruction. No bowel wall thickening. PERITONEUM AND RETROPERITONEUM: No ascites. No free air. VASCULATURE: Aorta is normal in caliber. LYMPH NODES: Several shotty mesenteric lymph nodes present. REPRODUCTIVE ORGANS: There is a fibroid in the uterine fundus measuring about 15 mm in diameter. BONES AND SOFT TISSUES: No acute osseous abnormality. No focal soft tissue abnormality. IMPRESSION: 1. No acute findings in the abdomen or pelvis.  Electronically signed by: Evalene Coho MD 09/25/2023 03:00 PM EDT RP Workstation: [id number]    Assessment & Plan:  B12 deficiency -     CBC with Differential/Platelet; Future     Follow-up: No follow-ups on file.  [name], MD

## 2023-12-27 ENCOUNTER — Ambulatory Visit: Payer: Self-pay | Admitting: Internal Medicine

## 2024-01-08 DIAGNOSIS — Z124 Encounter for screening for malignant neoplasm of cervix: Secondary | ICD-10-CM | POA: Diagnosis not present

## 2024-01-08 DIAGNOSIS — Z6836 Body mass index (BMI) 36.0-36.9, adult: Secondary | ICD-10-CM | POA: Diagnosis not present

## 2024-01-08 DIAGNOSIS — Z01419 Encounter for gynecological examination (general) (routine) without abnormal findings: Secondary | ICD-10-CM | POA: Diagnosis not present

## 2024-01-08 DIAGNOSIS — Z1231 Encounter for screening mammogram for malignant neoplasm of breast: Secondary | ICD-10-CM | POA: Diagnosis not present

## 2024-01-10 ENCOUNTER — Ambulatory Visit: Admitting: Physician Assistant

## 2024-01-10 ENCOUNTER — Encounter: Payer: Self-pay | Admitting: Physician Assistant

## 2024-01-10 VITALS — BP 122/70 | HR 90 | Ht 65.0 in | Wt 225.0 lb

## 2024-01-10 DIAGNOSIS — K219 Gastro-esophageal reflux disease without esophagitis: Secondary | ICD-10-CM

## 2024-01-10 DIAGNOSIS — K589 Irritable bowel syndrome without diarrhea: Secondary | ICD-10-CM | POA: Diagnosis not present

## 2024-01-10 DIAGNOSIS — K582 Mixed irritable bowel syndrome: Secondary | ICD-10-CM

## 2024-01-10 DIAGNOSIS — B3781 Candidal esophagitis: Secondary | ICD-10-CM

## 2024-01-10 MED ORDER — ESOMEPRAZOLE MAGNESIUM 40 MG PO CPDR: 40.0000 mg | DELAYED_RELEASE_CAPSULE | Freq: Every day | ORAL | 3 refills | Status: AC

## 2024-01-10 MED ORDER — NYSTATIN 100000 UNIT/ML MT SUSP: 5.0000 mL | Freq: Four times a day (QID) | OROMUCOSAL | 0 refills | Status: AC

## 2024-01-10 MED ORDER — HYOSCYAMINE SULFATE 0.125 MG PO TABS: 0.1250 mg | ORAL_TABLET | Freq: Three times a day (TID) | ORAL | 5 refills | Status: AC

## 2024-01-10 MED ORDER — FAMOTIDINE 40 MG PO TABS: 40.0000 mg | ORAL_TABLET | Freq: Every day | ORAL | 3 refills | Status: AC

## 2024-01-10 NOTE — Patient Instructions (Addendum)
° °  Take 1 Capsule Once Daily for 30 days. If GI symptoms improve, then OK to continue Align. If GI symptoms do not improve after 30 days, then discontinue.     Start OTC Benefiber Powder. Mix 1 - 2 Tablespoons in 6 - 8 ounces of a Drink Once Daily. Drink 64 ounces of water / fluids Daily.  ___________________________________________________  For GERD: Continue Nexium  40mg  once daily, take 30 minutes before breakfast. Start Famotidine  40mg  1 tablet once daily before bedtime.  For IBS: Start prescription Hyosciamine 0.125mg  1 tablet 3 times daily before meals for abdominal cramping.  For Yeast infection in Esophagus: Prescription  Rinse mouth and spit after using any steroid inhalers such as Fluticasone .  Recommend Lifestyle Modifications to prevent Acid Reflux.  Rec. Avoid coffee, sodas, peppermint, garlic, onions, alcohol, citrus fruits, chocolate, tomatoes, fatty and spicey foods.  Avoid eating 2-3 hours before bedtime.   _________________________________________________________________________  Please follow up sooner if symptoms increase or worsen  Due to recent changes in healthcare laws, you may see the results of your imaging and laboratory studies on MyChart before your provider has had a chance to review them.  We understand that in some cases there may be results that are confusing or concerning to you. Not all laboratory results come back in the same time frame and the provider may be waiting for multiple results in order to interpret others.  Please give us  48 hours in order for your provider to thoroughly review all the results before contacting the office for clarification of your results.   Thank you for trusting me with your gastrointestinal care!   Ellouise Console, PA-C _______________________________________________________  If your blood pressure at your visit was 140/90 or greater, please contact your primary care physician to follow up on  this. _______________________________________________________  If you are age 54 or older, your body mass index should be between 23-30. Your Body mass index is 37.44 kg/m. If this is out of the aforementioned range listed, please consider follow up with your Primary Care Provider.  If you are age 48 or younger, your body mass index should be between 19-25. Your Body mass index is 37.44 kg/m. If this is out of the aformentioned range listed, please consider follow up with your Primary Care Provider.   ________________________________________________________  The [facility] providers would like to encourage you to use MYCHART to communicate with providers for non-urgent requests or questions.  Due to long hold times on the telephone, sending your provider a message by [facility] may be a faster and more efficient way to get a response.  Please allow 48 business hours for a response.  Please remember that this is for non-urgent requests.  _______________________________________________________

## 2024-01-10 NOTE — Progress Notes (Signed)
 Agree with assessment and plan as outlined. If nocturnal symptoms persist despite the regimen as outlined, could consider adding baclofen nightly for nocturnal reflux (if no medication interactions)

## 2024-01-10 NOTE — Progress Notes (Signed)
 Rhonda Console, PA-C [address] [place], [place]  [address] Phone: [phone]   Primary Care Physician: Joshua Debby CROME, MD  Primary Gastroenterologist:  Rhonda Console, PA-C / Elspeth Naval, MD   Chief Complaint: Follow-up abdominal pain       HPI:   Discussed the use of AI scribe software for clinical note transcription with the patient, who gave verbal consent to proceed.  53 year old female, established patient of Dr. Naval, returns for follow-up of GERD and IBS.  She takes Nexium  40 mg daily QAM for acid reflux.  Zofran  as needed nausea.  IBgard for IBS symptoms.  09/19/2023 CT abdomen pelvis with contrast: No acute abnormality.  Incidental several reactive shotty mesenteric lymph nodes present.  15 mm uterine fibroid.  11/16/2023 EGD: Esophageal candidiasis (treated with nystatin  suspension).  Associated few small benign fundic gland gastric polyps.  Otherwise normal.  Biopsy negative for H. pylori.  08/2019 EGD: Benign fundic gland gastric polyps.  08/2019 colonoscopy: Mild diverticulosis.  Otherwise normal.  No polyps.  10-year repeat.  12/26/2023 normal CBC.  Hgb 13.5, WBC 6.7. 08/2023 normal CMP.  History of Present Illness Gastroesophageal reflux disease (gerd) symptoms - Worsening nocturnal symptoms characterized by acid reflux causing choking and disrupted breathing during sleep - No heartburn or reflux symptoms during the day while taking Nexium  40 mg once daily in the morning - Attempts symptom management by sleeping propped up on a pillow - Sensation of something in the throat (globus sensation) associated with acid reflux  Chronic gastrointestinal discomfort - Long-standing alternating diarrhea and constipation, attributed to cholecystectomy performed in 2017  - Symptoms have intensified over the past six months - Abdominal pain after consuming greasy foods, localized to the mid-abdomen above the umbilicus - No current medication for these  symptoms - Interested in probiotics and fiber supplements to improve gut health - Denies blood in stool.  Oropharyngeal and esophageal findings - History of esophageal yeast infection, previously treated with Nystatin  liquid. She has fluconazole  allergy - Persistent coated tongue  -she is worried about recurrent esophageal candidiasis. - No odynophagia or food impaction     Current Outpatient Medications  Medication Sig Dispense Refill   acetaminophen  (TYLENOL ) 325 MG tablet Take 325 mg by mouth every 4 (four) hours as needed for fever.     ALPRAZolam  (XANAX ) 0.5 MG tablet Take 1 tablet (0.5 mg total) by mouth 2 (two) times daily as needed for anxiety. 20 tablet 0   celecoxib  (CELEBREX ) 200 MG capsule Take 1 capsule (200 mg total) by mouth 2 (two) times daily. 60 capsule 1   cyanocobalamin  2000 MCG tablet Take 1 tablet (2,000 mcg total) by mouth daily. 90 tablet 0   famotidine  (PEPCID ) 40 MG tablet Take 1 tablet (40 mg total) by mouth at bedtime. 90 tablet 3   hyoscyamine  (LEVSIN ) 0.125 MG tablet Take 1 tablet (0.125 mg total) by mouth 3 (three) times daily before meals. 90 tablet 5   nystatin  (MYCOSTATIN ) 100000 UNIT/ML suspension Take 5 mLs (500,000 Units total) by mouth 4 (four) times daily for 10 days. 270 mL 0   OIL OF OREGANO PO Take 6,000 mg by mouth daily.     ondansetron  (ZOFRAN -ODT) 4 MG disintegrating tablet Take 1 tablet (4 mg total) by mouth every 8 (eight) hours as needed for nausea or vomiting. 20 tablet 1   Peppermint Oil (IBGARD) 90 MG CPCR Take as directed     topiramate  (TOPAMAX ) 25 MG tablet Take 1 tablet (  25 mg total) by mouth at bedtime. 30 tablet 6   esomeprazole  (NEXIUM ) 40 MG capsule Take 1 capsule (40 mg total) by mouth daily before breakfast. Can increase to twice a day if needed 90 capsule 3   No current facility-administered medications for this visit.    Allergies as of 01/10/2024 - Review Complete 01/10/2024  Allergen Reaction Noted   Amoxicillin  Hives  and Rash 12/28/2010   Bactrim  ds [sulfamethoxazole -trimethoprim ] Other (See Comments) 02/06/2017   Cephalosporins Hives 02/03/2017   Fluconazole  Rash 07/30/2019   Oseltamivir phosphate Swelling 01/05/2009    Past Medical History:  Diagnosis Date   Allergic rhinitis    Allergy    B12 deficiency    Carpal tunnel syndrome    Dysfunction of eustachian tube    GERD (gastroesophageal reflux disease)    Migraines     Past Surgical History:  Procedure Laterality Date   BUNIONECTOMY     right    CHOLECYSTECTOMY N/A 09/25/2015   Procedure: LAPAROSCOPIC CHOLECYSTECTOMY WITH INTRAOPERATIVE CHOLANGIOGRAM;  Surgeon: Alm Angle, MD;  Location: [facility];  Service: General;  Laterality: N/A;   TONSILLECTOMY      Review of Systems:    All systems reviewed and negative except where noted in HPI.    Physical Exam:  BP 122/70   Pulse 90   Ht 5' 5 (1.651 m)   Wt 225 lb (102.1 kg)   LMP 10/09/2023 (Approximate)   BMI 37.44 kg/m  Patient's last menstrual period was 10/09/2023 (approximate).  General: Well-nourished, well-developed in no acute distress. Mouth: Tongue has a white coating.  Pharynx is clear without erythema. Lungs: Clear to auscultation bilaterally. Non-labored. Heart: Regular rate and rhythm, no murmurs rubs or gallops.  Abdomen: Bowel sounds are normal; Abdomen is Soft; No hepatosplenomegaly, masses or hernias;  No Abdominal Tenderness; No guarding or rebound tenderness. Neuro: Alert and oriented x 3.  Grossly intact.  Psych: Alert and cooperative, normal mood and affect.  Imaging Studies: No results found.  Labs: CBC    Component Value Date/Time   WBC 6.7 12/26/2023 1337   RBC 4.51 12/26/2023 1337   HGB 13.5 12/26/2023 1337   HGB 13.7 05/30/2023 1515   HCT 39.4 12/26/2023 1337   PLT 436.0 (H) 12/26/2023 1337   PLT 396 05/30/2023 1515   MCV 87.4 12/26/2023 1337   MCH 30.0 05/30/2023 1515   MCHC 34.2 12/26/2023 1337   RDW 13.6 12/26/2023 1337   LYMPHSABS 2.2  12/26/2023 1337   MONOABS 0.5 12/26/2023 1337   EOSABS 0.1 12/26/2023 1337   BASOSABS 0.0 12/26/2023 1337    CMP     Component Value Date/Time   NA 139 09/14/2023 1524   K 3.8 09/14/2023 1524   CL 101 09/14/2023 1524   CO2 29 09/14/2023 1524   GLUCOSE 100 (H) 09/14/2023 1524   BUN 12 09/14/2023 1524   CREATININE 0.87 09/14/2023 1524   CREATININE 0.78 03/01/2023 0849   CALCIUM 8.8 09/14/2023 1524   PROT 7.3 09/14/2023 1524   ALBUMIN 4.0 09/14/2023 1524   AST 14 09/14/2023 1524   AST 14 (L) 03/01/2023 0849   ALT 13 09/14/2023 1524   ALT 10 03/01/2023 0849   ALKPHOS 109 09/14/2023 1524   BILITOT 0.6 09/14/2023 1524   BILITOT 0.9 03/01/2023 0849   GFRNONAA >60 03/01/2023 0849   GFRAA >60 02/28/2019 1136   GFRAA >60 01/16/2019 1011       Assessment and Plan:   ANALAYA HOEY is a 53 y.o. y/o  female returns for follow-up of:  Assessment & Plan 1.  Gastroesophageal reflux disease She is having breakthrough nocturnal acid reflux. - Continue Nexium  40 mg once daily in the morning. - Add famotidine  40 mg once daily before bedtime. - Provided a list of foods and drinks to avoid that can cause acid reflux. - Advised sleeping propped up on a pillow. - Recommended avoiding eating or drinking 2-3 hours before bedtime.  2.  Irritable bowel syndrome Symptoms consistent with IBS, exacerbated by greasy foods. - Start a Align probiotic to help with IBS. - Start Benefiber powder, one tablespoon in a drink once a day to help with irregular bowel habits. - Prescribed hyoscyamine  0.125 mg, one tablet three times a day as needed for abdominal cramping.  3.  Candidiasis of esophagus Discussed potential for recurrent infection and importance of oral hygiene post-inhaler use. - Prescribed nystatin  suspension 5 mL 3 times daily for 10 days to treat recurrent esophageal candidiasis. - Advised rinsing mouth and spitting after using steroid inhalers to prevent yeast infections.   Rhonda Console, PA-C  Follow up in 3 months.  Also follow-up based on GI symptoms.

## 2024-02-13 ENCOUNTER — Encounter: Payer: Self-pay | Admitting: Physician Assistant

## 2024-02-13 ENCOUNTER — Ambulatory Visit: Admitting: Physician Assistant

## 2024-02-13 DIAGNOSIS — M5440 Lumbago with sciatica, unspecified side: Secondary | ICD-10-CM

## 2024-02-13 DIAGNOSIS — M25532 Pain in left wrist: Secondary | ICD-10-CM

## 2024-02-13 MED ORDER — METHYLPREDNISOLONE ACETATE 40 MG/ML IJ SUSP
40.0000 mg | INTRAMUSCULAR | Status: AC | PRN
  Administered 2024-02-13: 40 mg

## 2024-02-13 MED ORDER — LIDOCAINE HCL 1 % IJ SOLN
1.0000 mL | INTRAMUSCULAR | Status: AC | PRN
  Administered 2024-02-13: 1 mL

## 2024-02-13 NOTE — Progress Notes (Signed)
 "  Office Visit Note   Patient: Rhonda [name]           Date of Birth: 1970/03/19           MRN: [id number] Visit Date: 02/13/2024              Requested by: Joshua Debby CROME, MD [address] [place],  [place] [address] PCP: Joshua Debby CROME, MD  Chief Complaint  Patient presents with   Left Wrist - Pain, Numbness, Weakness      HPI: Patient is a pleasant 54 year old right-hand-dominant woman comes in today with pain at the base of her thumb.  She does do repetitive work had no injuries.  She has a history of carpal tunnel syndrome but this feels different  Assessment & Plan: Visit Diagnoses:  1. Acute bilateral low back pain with sciatica, sciatica laterality unspecified     Plan: Will go forward with an injection today for the de Quervain's she does have continued problems with her back and has failed physical therapy and steroids and muscle relaxants we will order an MRIFollow-Up Instructions:   Ortho Exam  Patient is alert, oriented, no adenopathy, well-dressed, normal affect, normal respiratory effort. Examination of her left hand she has no swelling no erythema she has strong pulse positive Finkelstein's test negative Tinel's test. Examination of her back she has reproducible straight leg raise on the left.  She has 5 out of 5 strength with resisted dorsiflexion plantarflexion extension and flexion of her legs she has no crepitus no step-offs    Imaging: No results found. No images are attached to the encounter.  Labs: Lab Results  Component Value Date   HGBA1C 5.5 07/12/2023   HGBA1C 5.6 11/01/2021   HGBA1C 5.4 04/03/2020   ESRSEDRATE 83 (H) 08/30/2018   ESRSEDRATE 59 (H) 08/29/2018   ESRSEDRATE 36 (H) 07/05/2013   CRP 1.4 11/14/2022   CRP 6.3 (H) 08/30/2018   CRP 8.5 08/29/2018   REPTSTATUS 09/04/2018 FINAL 08/30/2018   CULT  08/30/2018    NO GROWTH 5 DAYS Performed at [facility], [address], [place], [place] [address]      Lab Results   Component Value Date   ALBUMIN 4.0 09/14/2023   ALBUMIN 3.9 03/01/2023   ALBUMIN 4.1 11/30/2022    Lab Results  Component Value Date   MG 2.0 06/11/2015   Lab Results  Component Value Date   VD25OH 25.06 (L) 11/01/2021   VD25OH 37.02 02/02/2018   VD25OH 18.55 (L) 10/26/2017    No results found for: PREALBUMIN    Latest Ref Rng & Units 12/26/2023    1:37 PM 09/14/2023    3:24 PM 05/30/2023    3:15 PM  CBC EXTENDED  WBC 4.0 - 10.5 K/uL 6.7  8.0  7.1   RBC 3.87 - 5.11 Mil/uL 4.51  4.47  4.56   Hemoglobin 12.0 - 15.0 g/dL 86.4  86.3  86.2   HCT 36.0 - 46.0 % 39.4  40.0  40.4   Platelets 150.0 - 400.0 K/uL 436.0  423.0  396   NEUT# 1.4 - 7.7 K/uL 3.8  4.8  3.8   Lymph# 0.7 - 4.0 K/uL 2.2  2.2  2.5      There is no height or weight on file to calculate BMI.  Orders:  Orders Placed This Encounter  Procedures   MR Lumbar Spine w/o contrast   No orders of the defined types were placed in this encounter.  Procedures: Hand/UE Inj for de Quervain's tenosynovitis on 02/13/2024 2:41 PM Indications: diagnostic and therapeutic Details: 22 G needle Medications: 1 mL lidocaine  1 %; 40 mg methylPREDNISolone  acetate 40 MG/ML Outcome: tolerated well, no immediate complications Procedure, treatment alternatives, risks and benefits explained, specific risks discussed. Consent was given by the patient.      Clinical Data: No additional findings.  ROS:  All other systems negative, except as noted in the HPI. Review of Systems  Objective: Vital Signs: There were no vitals taken for this visit.  Specialty Comments:  No specialty comments available.  PMFS History: Patient Active Problem List   Diagnosis Date Noted   Need for immunization against influenza 10/09/2023   Encounter for general adult medical examination with abnormal findings 10/09/2023   Chiari malformation type I (HCC) 08/14/2023   Migraine without aura and without status migrainosus, not intractable  08/11/2023   Major depressive disorder with single episode 08/03/2023   Generalized anxiety disorder 08/03/2023   Dyslipidemia, goal LDL below 130 07/12/2023   Hyperglycemia 07/12/2023   Laryngitis, chronic 03/20/2023   Low back pain 02/02/2023   Flu vaccine need 11/14/2022   Obesity (BMI 35.0-39.9 without comorbidity) 11/01/2021   Vitamin D  deficiency 11/01/2021   Eczema 07/20/2021   Primary osteoarthritis of both knees 02/08/2021   Screen for colon cancer 07/30/2019   Psychophysiological insomnia 07/30/2019   Chiari malformation type II (HCC) 09/27/2016   Abnormal MRI of head 03/07/2016   Gastroesophageal reflux disease without esophagitis 08/19/2015   IBS (irritable bowel syndrome) 08/19/2015   B12 deficiency 12/08/2008   CARPAL TUNNEL SYNDROME 05/14/2007   DE QUERVAIN'S TENOSYNOVITIS 05/14/2007   Allergic rhinitis 01/31/2007   Past Medical History:  Diagnosis Date   Allergic rhinitis    Allergy    B12 deficiency    Carpal tunnel syndrome    Dysfunction of eustachian tube    GERD (gastroesophageal reflux disease)    Migraines     Family History  Problem Relation Age of Onset   Hyperlipidemia Mother    Hypertension Mother    Heart disease Father    Hypertension Father    Diabetes Father    Diabetes Brother    Kidney cancer Brother 34   Diabetes Brother    Heart disease Maternal Grandmother    Headache Neg Hx    Colon cancer Neg Hx    Prostate cancer Neg Hx    Rectal cancer Neg Hx    Stomach cancer Neg Hx    Esophageal cancer Neg Hx     Past Surgical History:  Procedure Laterality Date   BUNIONECTOMY     right    CHOLECYSTECTOMY N/A 09/25/2015   Procedure: LAPAROSCOPIC CHOLECYSTECTOMY WITH INTRAOPERATIVE CHOLANGIOGRAM;  Surgeon: Alm Angle, MD;  Location: [facility];  Service: General;  Laterality: N/A;   TONSILLECTOMY     Social History   Occupational History   Occupation: [employer]er  Tobacco Use   Smoking status: Never   Smokeless tobacco: Never   Vaping Use   Vaping status: Never Used  Substance and Sexual Activity   Alcohol use: No   Drug use: No   Sexual activity: Not Currently       "

## 2024-02-14 ENCOUNTER — Encounter: Payer: Self-pay | Admitting: Adult Health

## 2024-02-14 ENCOUNTER — Ambulatory Visit: Admitting: Adult Health

## 2024-02-14 VITALS — BP 118/86 | HR 90 | Ht 65.0 in | Wt 228.8 lb

## 2024-02-14 DIAGNOSIS — G935 Compression of brain: Secondary | ICD-10-CM | POA: Diagnosis not present

## 2024-02-14 DIAGNOSIS — G4489 Other headache syndrome: Secondary | ICD-10-CM | POA: Diagnosis not present

## 2024-02-14 DIAGNOSIS — G43009 Migraine without aura, not intractable, without status migrainosus: Secondary | ICD-10-CM

## 2024-02-14 MED ORDER — TOPIRAMATE 25 MG PO TABS: 25.0000 mg | ORAL_TABLET | Freq: Every day | ORAL | 3 refills | Status: AC

## 2024-02-14 NOTE — Patient Instructions (Signed)
 Your Plan:  Continue topamax  25mg  nightly for headache prevention  Continue use of Tylenol  as needed      Follow up in 1 year or call earlier if needed     Thank you for coming to see us  at [facility]. I hope we have been able to provide you high quality care today.  You may receive a patient satisfaction survey over the next few weeks. We would appreciate your feedback and comments so that we may continue to improve ourselves and the health of our patients.

## 2024-02-14 NOTE — Progress Notes (Signed)
 " [facility] [address] [place]. [place] [address] [phone]       OFFICE FOLLOW UP NOTE  Ms. Rhonda [name] Date of Birth:  09-Apr-1970 Medical Record Number:  [id number]     Reason for visit: Migraine headaches    SUBJECTIVE:  CHIEF COMPLAINT:  Chief Complaint  Patient presents with   RM 8     Patient is here for migraine/ headaches - patient was last seen by Dr. Ines 08/11/23. No issues with headache/ migraines. Currently working from home     Follow-up visit:  Prior visit: 08/11/2023 with Dr. Ines  Brief HPI:   Rhonda [name] is a 54 y.o. female with complex medical history including but not limited to chronic headaches, GRE malformation type II, psychosocial insomnia, vitamin D  deficiency, low back pain, neck pain, HTN, HLD, depression and anxiety.  She was initially seen by Dr. Darleen in 2018 after abnormal MRI showing 11 mm Chiari malformation and new onset daily tension type headaches.  Referred to neurosurgery for evaluation of Chiari malformation and started nortriptyline  nightly.    She returned in 07/2023 as she was lost to follow-up during the interval time.  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  Noted 16 headache days per month still consistent with tension type headaches.  Noted significant increase anxiety and ongoing neck pain.  Noted dizziness when she stands up or turns head too quickly.  Suspected underlying depression and anxiety are highly correlated to recurrent headaches, Chiari malformation asymptomatic but per patient request, she was referred back to neurosurgery.  Suspected dizziness more orthostatic and discussed conservative measures.  Requested work accommodations to prevent triggering of headaches.  She was started on Topamax  for headache prevention.     Interval history:  Patient returns for follow-up visit.  She returned to working from home about 2  months ago and has noted significant improvement of her migraine headaches.  She has had about 5 headaches per month and typically resolve after use of Tylenol .  They are still frontal and temporal.  Bright fluorescent lights can trigger headaches.  She can have occasional blurry vision.  Reevaluated by neurosurgery Dr. Rosslyn in September and felt Chiari malformation asymptomatic without need of surgical intervention.  Plans on follow-up in 1 year.      Previously tried/failed: Ibuprofen Excedrin Aleve Nortriptyline  Venlafaxine  Blood pressure medications contraindicated due to hypotension Aimovig contraindicated due to constipation     ROS:   14 system review of systems performed and negative with exception of those listed in HPI  PMH:  Past Medical History:  Diagnosis Date   Allergic rhinitis    Allergy    B12 deficiency    Carpal tunnel syndrome    Dysfunction of eustachian tube    GERD (gastroesophageal reflux disease)    Migraines     PSH:  Past Surgical History:  Procedure Laterality Date   BUNIONECTOMY     right    CHOLECYSTECTOMY N/A 09/25/2015   Procedure: LAPAROSCOPIC CHOLECYSTECTOMY WITH INTRAOPERATIVE CHOLANGIOGRAM;  Surgeon: Alm Angle, MD;  Location: [facility];  Service: General;  Laterality: N/A;   TONSILLECTOMY      Social History:  Social History   Socioeconomic History   Marital status: Single    Spouse name: Not on file   Number of children: 0   Years of education: Assoc   Highest education level: Associate degree: occupational, scientist, product/process development, or vocational program  Occupational History   Occupation: Corporate Investment Banker  Tobacco Use   Smoking status: Never   Smokeless tobacco: Never  Vaping Use   Vaping status: Never Used  Substance and Sexual Activity   Alcohol use: No   Drug use: No   Sexual activity: Not Currently  Other Topics Concern   Not on file  Social History Narrative   College grad   Single   Bought her own home in '09    Occupation: Doctor, General Practice and cleaning   Fun: Resting, yard work   Denies religious beliefs effecting health care.    caffeine 3 times weekly    Social Drivers of Health   Tobacco Use: Low Risk (02/13/2024)   Patient History    Smoking Tobacco Use: Never    Smokeless Tobacco Use: Never    Passive Exposure: Not on file  Financial Resource Strain: Low Risk (11/22/2023)   Overall Financial Resource Strain (CARDIA)    Difficulty of Paying Living Expenses: Not very hard  Food Insecurity: No Food Insecurity (11/22/2023)   Epic    Worried About Programme Researcher, Broadcasting/film/video in the Last Year: Never true    Ran Out of Food in the Last Year: Never true  Transportation Needs: No Transportation Needs (11/22/2023)   Epic    Lack of Transportation (Medical): No    Lack of Transportation (Non-Medical): No  Physical Activity: Inactive (11/22/2023)   Exercise Vital Sign    Days of Exercise per Week: 0 days    Minutes of Exercise per Session: Not on file  Stress: Stress Concern Present (11/22/2023)   [employer] of Occupational Health - Occupational Stress Questionnaire    Feeling of Stress: Very much  Social Connections: Moderately Integrated (11/22/2023)   Social Connection and Isolation Panel    Frequency of Communication with Friends and Family: More than three times a week    Frequency of Social Gatherings with Friends and Family: Never    Attends Religious Services: More than 4 times per year    Active Member of [employer] or Organizations: Yes    Attends Banker Meetings: 1 to 4 times per year    Marital Status: Divorced  Catering Manager Violence: Not on file  Depression (PHQ2-9): High Risk (10/09/2023)   Depression (PHQ2-9)    PHQ-2 Score: 15  Alcohol Screen: Not on file  Housing: High Risk (11/22/2023)   Epic    Unable to Pay for Housing in the Last Year: Yes    Number of Times Moved in the Last Year: 0    Homeless in the Last Year: No  Utilities: Not on file  Health Literacy:  Not on file    Family History:  Family History  Problem Relation Age of Onset   Hyperlipidemia Mother    Hypertension Mother    Heart disease Father    Hypertension Father    Diabetes Father    Sleep apnea Brother    Diabetes Brother    Kidney cancer Brother 39   Diabetes Brother    Heart disease Maternal Grandmother    Headache Neg Hx    Colon cancer Neg Hx    Prostate cancer Neg Hx    Rectal cancer Neg Hx    Stomach cancer Neg Hx    Esophageal cancer Neg Hx    Migraines Neg Hx    Seizures Neg Hx    Stroke Neg Hx     Medications:  Medications Ordered Prior to Encounter[1]  Allergies:  Allergies[2]  OBJECTIVE:  Physical Exam  Vitals:   02/14/24 1400  BP: 118/86  Pulse: 90  Weight: 228 lb 12.8 oz (103.8 kg)  Height: 5' 5 (1.651 m)   Body mass index is 38.07 kg/m. No results found.   General: well developed, well nourished, pleasant middle-aged African female, seated, in no evident distress  Neurologic Exam Mental Status: Awake and fully alert. Oriented to place and time. Recent and remote memory intact. Attention span, concentration and fund of knowledge appropriate. Mood and affect appropriate.  Cranial Nerves: Extraocular movements full without nystagmus. Visual fields full to confrontation. Hearing intact. Facial sensation intact. Face, tongue, palate moves normally and symmetrically.  Motor: Normal bulk and tone. Normal strength in all tested extremity muscles Coordination: Rapid alternating movements normal in all extremities. Finger-to-nose and heel-to-shin performed accurately bilaterally. Gait and Station: Arises from chair without difficulty. Stance is normal. Gait demonstrates normal stride length and balance  Reflexes: 1+ and symmetric. Toes downgoing.         ASSESSMENT/PLAN: Rhonda [name] is a 54 y.o. year old female      Chronic headaches, mixed tension and migrainous features:  Chiari malformation: Headaches greatly improved  since working remotely again over the past 2 months as headaches can be triggered by bright fluorescent lights Continue topiramate  25 mg nightly, refill provided, consider dosage increase in the future if needed Continue Tylenol  as needed Discussed obtaining bluelight filtering glasses to help reduce sensitivity to bright light Discussed continued conservative measures at home Follow-up with Dr. Rosslyn in September for Chiari malformation monitoring     Follow up in 1 year with new MD to establish care or call earlier if needed   CC:  PCP: Joshua Debby CROME, MD    I personally spent a total of 25 minutes in the care of the patient today including preparing to see the patient, getting/reviewing separately obtained history, performing a medically appropriate exam/evaluation, counseling and educating, placing orders, and documenting clinical information in the EHR. This is our first time meeting and time has been spent reviewing past medical history and relevant medical records.   Harlene Bogaert, AGNP-BC  [facility] [address] [place], [place] [address]  Phone [phone] Fax [phone] Note: This document was prepared with digital dictation and possible smart phrase technology. Any transcriptional errors that result from this process are unintentional.     [1]  Current Outpatient Medications on File Prior to Visit  Medication Sig Dispense Refill   acetaminophen  (TYLENOL ) 325 MG tablet Take 325 mg by mouth every 4 (four) hours as needed for fever.     ALPRAZolam  (XANAX ) 0.5 MG tablet Take 1 tablet (0.5 mg total) by mouth 2 (two) times daily as needed for anxiety. 20 tablet 0   celecoxib  (CELEBREX ) 200 MG capsule Take 1 capsule (200 mg total) by mouth 2 (two) times daily. 60 capsule 1   cyanocobalamin  2000 MCG tablet Take 1 tablet (2,000 mcg total) by mouth daily. 90 tablet 0   esomeprazole  (NEXIUM ) 40 MG capsule Take 1 capsule (40 mg total) by  mouth daily before breakfast. Can increase to twice a day if needed 90 capsule 3   famotidine  (PEPCID ) 40 MG tablet Take 1 tablet (40 mg total) by mouth at bedtime. 90 tablet 3   hyoscyamine  (LEVSIN ) 0.125 MG tablet Take 1 tablet (0.125 mg total) by mouth 3 (three) times daily before meals. (Patient taking differently: Take 0.125 mg by mouth 3 (three) times daily before meals. As needed)  90 tablet 5   ondansetron  (ZOFRAN -ODT) 4 MG disintegrating tablet Take 1 tablet (4 mg total) by mouth every 8 (eight) hours as needed for nausea or vomiting. 20 tablet 1   topiramate  (TOPAMAX ) 25 MG tablet Take 1 tablet (25 mg total) by mouth at bedtime. 30 tablet 6   OIL OF OREGANO PO Take 6,000 mg by mouth daily. (Patient not taking: Reported on 02/14/2024)     Peppermint Oil (IBGARD) 90 MG CPCR Take as directed (Patient not taking: Reported on 02/14/2024)     No current facility-administered medications on file prior to visit.  [2]  Allergies Allergen Reactions   Shellfish Allergy Other (See Comments)   Amoxicillin  Hives and Rash    Has patient had a PCN reaction causing immediate rash, facial/tongue/throat swelling, SOB or lightheadedness with hypotension: Yes Has patient had a PCN reaction causing severe rash involving mucus membranes or skin necrosis: Yes Has patient had a PCN reaction that required hospitalization No Has patient had a PCN reaction occurring within the last 10 years: Yes If all of the above answers are NO, then may proceed with Cephalosporin use.    Bactrim  Ds [Sulfamethoxazole -Trimethoprim ] Other (See Comments)    Blisters    Cephalosporins Hives    Hives    Fluconazole  Rash    Added blisters    Oseltamivir Phosphate Swelling   "

## 2024-02-27 ENCOUNTER — Other Ambulatory Visit

## 2024-03-02 ENCOUNTER — Other Ambulatory Visit

## 2024-04-08 ENCOUNTER — Ambulatory Visit: Admitting: Internal Medicine

## 2024-06-25 ENCOUNTER — Ambulatory Visit: Admitting: Internal Medicine

## 2024-10-08 ENCOUNTER — Ambulatory Visit: Admitting: Neurosurgery

## 2025-02-13 ENCOUNTER — Ambulatory Visit: Admitting: Neurology
